# Patient Record
Sex: Male | Born: 1935 | Race: White | Hispanic: No | Marital: Married | Smoking: Former smoker
Health system: Southern US, Community
[De-identification: ages and names within clinical notes are randomized; demographics above are authoritative.]

## PROBLEM LIST (undated history)

## (undated) ENCOUNTER — Emergency Department (HOSPITAL_COMMUNITY): Disposition: A | Discharge status: Home / Self Care | Payer: Commercial Managed Care - HMO

## (undated) ENCOUNTER — Emergency Department (HOSPITAL_COMMUNITY): Payer: Self-pay

## (undated) DIAGNOSIS — I2510 Atherosclerotic heart disease of native coronary artery without angina pectoris: Secondary | ICD-10-CM

## (undated) DIAGNOSIS — Z860100 Personal history of colon polyps, unspecified: Secondary | ICD-10-CM

## (undated) DIAGNOSIS — I639 Cerebral infarction, unspecified: Secondary | ICD-10-CM

## (undated) DIAGNOSIS — F0390 Unspecified dementia without behavioral disturbance: Secondary | ICD-10-CM

## (undated) DIAGNOSIS — I4891 Unspecified atrial fibrillation: Secondary | ICD-10-CM

## (undated) DIAGNOSIS — C679 Malignant neoplasm of bladder, unspecified: Secondary | ICD-10-CM

## (undated) DIAGNOSIS — E538 Deficiency of other specified B group vitamins: Secondary | ICD-10-CM

## (undated) DIAGNOSIS — R413 Other amnesia: Secondary | ICD-10-CM

## (undated) DIAGNOSIS — E785 Hyperlipidemia, unspecified: Secondary | ICD-10-CM

## (undated) HISTORY — PX: CHOLECYSTECTOMY: SHX55

## (undated) HISTORY — DX: Atherosclerotic heart disease of native coronary artery without angina pectoris: I25.10

## (undated) HISTORY — DX: Malignant neoplasm of bladder, unspecified: C67.9

## (undated) HISTORY — DX: Deficiency of other specified B group vitamins: E53.8

## (undated) HISTORY — DX: Other amnesia: R41.3

## (undated) HISTORY — DX: Personal history of colon polyps, unspecified: Z86.0100

## (undated) HISTORY — DX: Personal history of colonic polyps: Z86.010

## (undated) HISTORY — DX: Hyperlipidemia, unspecified: E78.5

---

## 2001-06-12 ENCOUNTER — Ambulatory Visit (HOSPITAL_COMMUNITY): Admission: RE | Admit: 2001-06-12 | Discharge: 2001-06-13 | Payer: Self-pay | Admitting: Cardiovascular Disease

## 2001-08-11 ENCOUNTER — Emergency Department (HOSPITAL_COMMUNITY): Admission: EM | Admit: 2001-08-11 | Discharge: 2001-08-11 | Payer: Self-pay | Admitting: *Deleted

## 2002-01-02 ENCOUNTER — Ambulatory Visit (HOSPITAL_COMMUNITY): Admission: RE | Admit: 2002-01-02 | Discharge: 2002-01-02 | Payer: Self-pay | Admitting: Gastroenterology

## 2002-02-28 ENCOUNTER — Ambulatory Visit (HOSPITAL_COMMUNITY): Admission: RE | Admit: 2002-02-28 | Discharge: 2002-02-28 | Payer: Self-pay | Admitting: Neurological Surgery

## 2002-02-28 ENCOUNTER — Encounter: Payer: Self-pay | Admitting: Neurological Surgery

## 2002-03-04 ENCOUNTER — Ambulatory Visit (HOSPITAL_COMMUNITY): Admission: RE | Admit: 2002-03-04 | Discharge: 2002-03-04 | Payer: Self-pay | Admitting: Neurological Surgery

## 2002-03-05 ENCOUNTER — Ambulatory Visit (HOSPITAL_COMMUNITY): Admission: RE | Admit: 2002-03-05 | Discharge: 2002-03-05 | Payer: Self-pay | Admitting: Neurological Surgery

## 2002-03-05 ENCOUNTER — Encounter: Payer: Self-pay | Admitting: Neurological Surgery

## 2002-03-14 ENCOUNTER — Ambulatory Visit (HOSPITAL_COMMUNITY): Admission: RE | Admit: 2002-03-14 | Discharge: 2002-03-14 | Payer: Self-pay | Admitting: Surgery

## 2002-03-14 ENCOUNTER — Encounter: Payer: Self-pay | Admitting: Surgery

## 2002-03-21 ENCOUNTER — Encounter: Payer: Self-pay | Admitting: Surgery

## 2002-03-21 ENCOUNTER — Ambulatory Visit (HOSPITAL_COMMUNITY): Admission: RE | Admit: 2002-03-21 | Discharge: 2002-03-21 | Payer: Self-pay | Admitting: Surgery

## 2002-05-17 ENCOUNTER — Emergency Department (HOSPITAL_COMMUNITY): Admission: EM | Admit: 2002-05-17 | Discharge: 2002-05-17 | Payer: Self-pay | Admitting: Emergency Medicine

## 2002-06-26 ENCOUNTER — Encounter: Admission: RE | Admit: 2002-06-26 | Discharge: 2002-06-26 | Payer: Self-pay | Admitting: Surgery

## 2002-06-26 ENCOUNTER — Encounter: Payer: Self-pay | Admitting: Surgery

## 2002-07-24 ENCOUNTER — Ambulatory Visit (HOSPITAL_COMMUNITY): Admission: RE | Admit: 2002-07-24 | Discharge: 2002-07-24 | Payer: Self-pay | Admitting: Surgery

## 2002-07-24 ENCOUNTER — Encounter: Payer: Self-pay | Admitting: Surgery

## 2002-07-24 ENCOUNTER — Encounter (INDEPENDENT_AMBULATORY_CARE_PROVIDER_SITE_OTHER): Payer: Self-pay | Admitting: *Deleted

## 2003-01-23 ENCOUNTER — Encounter: Admission: RE | Admit: 2003-01-23 | Discharge: 2003-01-23 | Payer: Self-pay | Admitting: Surgery

## 2003-01-23 ENCOUNTER — Encounter: Payer: Self-pay | Admitting: Surgery

## 2003-04-10 ENCOUNTER — Emergency Department (HOSPITAL_COMMUNITY): Admission: EM | Admit: 2003-04-10 | Discharge: 2003-04-10 | Payer: Self-pay | Admitting: Emergency Medicine

## 2003-06-20 ENCOUNTER — Inpatient Hospital Stay (HOSPITAL_COMMUNITY): Admission: EM | Admit: 2003-06-20 | Discharge: 2003-06-25 | Payer: Self-pay | Admitting: Emergency Medicine

## 2003-07-09 ENCOUNTER — Inpatient Hospital Stay (HOSPITAL_COMMUNITY): Admission: EM | Admit: 2003-07-09 | Discharge: 2003-07-11 | Payer: Self-pay | Admitting: Emergency Medicine

## 2003-07-22 ENCOUNTER — Encounter: Admission: RE | Admit: 2003-07-22 | Discharge: 2003-07-22 | Payer: Self-pay | Admitting: Surgery

## 2004-04-21 ENCOUNTER — Inpatient Hospital Stay (HOSPITAL_COMMUNITY): Admission: EM | Admit: 2004-04-21 | Discharge: 2004-04-22 | Payer: Self-pay | Admitting: Emergency Medicine

## 2004-05-03 ENCOUNTER — Encounter: Admission: RE | Admit: 2004-05-03 | Discharge: 2004-05-03 | Payer: Self-pay | Admitting: Surgery

## 2004-06-14 ENCOUNTER — Ambulatory Visit: Payer: Self-pay | Admitting: Internal Medicine

## 2004-06-20 ENCOUNTER — Ambulatory Visit: Payer: Self-pay | Admitting: Internal Medicine

## 2004-07-27 ENCOUNTER — Emergency Department (HOSPITAL_COMMUNITY): Admission: EM | Admit: 2004-07-27 | Discharge: 2004-07-27 | Payer: Self-pay | Admitting: Emergency Medicine

## 2005-03-03 ENCOUNTER — Ambulatory Visit (HOSPITAL_COMMUNITY): Admission: RE | Admit: 2005-03-03 | Discharge: 2005-03-03 | Payer: Self-pay | Admitting: Gastroenterology

## 2005-03-03 ENCOUNTER — Encounter (INDEPENDENT_AMBULATORY_CARE_PROVIDER_SITE_OTHER): Payer: Self-pay | Admitting: Specialist

## 2005-12-03 ENCOUNTER — Inpatient Hospital Stay (HOSPITAL_COMMUNITY): Admission: AD | Admit: 2005-12-03 | Discharge: 2005-12-08 | Payer: Self-pay | Admitting: Cardiovascular Disease

## 2005-12-03 ENCOUNTER — Encounter: Payer: Self-pay | Admitting: Emergency Medicine

## 2005-12-07 ENCOUNTER — Encounter (INDEPENDENT_AMBULATORY_CARE_PROVIDER_SITE_OTHER): Payer: Self-pay | Admitting: *Deleted

## 2007-07-17 ENCOUNTER — Emergency Department (HOSPITAL_COMMUNITY): Admission: EM | Admit: 2007-07-17 | Discharge: 2007-07-17 | Payer: Self-pay | Admitting: Emergency Medicine

## 2007-08-21 ENCOUNTER — Encounter: Payer: Self-pay | Admitting: Internal Medicine

## 2008-02-10 ENCOUNTER — Encounter: Payer: Self-pay | Admitting: Internal Medicine

## 2008-03-06 ENCOUNTER — Telehealth: Payer: Self-pay | Admitting: Internal Medicine

## 2008-10-19 ENCOUNTER — Encounter: Payer: Self-pay | Admitting: Internal Medicine

## 2008-11-11 ENCOUNTER — Encounter: Payer: Self-pay | Admitting: Internal Medicine

## 2008-11-18 ENCOUNTER — Encounter: Payer: Self-pay | Admitting: Internal Medicine

## 2008-11-19 ENCOUNTER — Encounter: Payer: Self-pay | Admitting: Internal Medicine

## 2008-12-18 ENCOUNTER — Encounter: Admission: RE | Admit: 2008-12-18 | Discharge: 2008-12-18 | Payer: Self-pay | Admitting: Cardiovascular Disease

## 2008-12-18 ENCOUNTER — Encounter: Payer: Self-pay | Admitting: Internal Medicine

## 2008-12-23 ENCOUNTER — Inpatient Hospital Stay (HOSPITAL_COMMUNITY): Admission: RE | Admit: 2008-12-23 | Discharge: 2008-12-24 | Payer: Self-pay | Admitting: Cardiovascular Disease

## 2009-01-04 ENCOUNTER — Encounter: Payer: Self-pay | Admitting: Internal Medicine

## 2009-03-11 ENCOUNTER — Encounter: Payer: Self-pay | Admitting: Internal Medicine

## 2009-03-17 ENCOUNTER — Ambulatory Visit (HOSPITAL_COMMUNITY): Admission: RE | Admit: 2009-03-17 | Discharge: 2009-03-17 | Payer: Self-pay | Admitting: Cardiovascular Disease

## 2009-05-27 ENCOUNTER — Encounter: Payer: Self-pay | Admitting: Internal Medicine

## 2009-07-10 DIAGNOSIS — E538 Deficiency of other specified B group vitamins: Secondary | ICD-10-CM

## 2009-07-10 DIAGNOSIS — R413 Other amnesia: Secondary | ICD-10-CM

## 2009-07-10 HISTORY — DX: Other amnesia: R41.3

## 2009-07-10 HISTORY — DX: Deficiency of other specified B group vitamins: E53.8

## 2009-09-22 ENCOUNTER — Encounter: Payer: Self-pay | Admitting: Internal Medicine

## 2009-09-24 ENCOUNTER — Ambulatory Visit (HOSPITAL_COMMUNITY): Admission: RE | Admit: 2009-09-24 | Discharge: 2009-09-24 | Payer: Self-pay | Admitting: Sports Medicine

## 2009-11-02 ENCOUNTER — Ambulatory Visit: Payer: Self-pay | Admitting: Internal Medicine

## 2009-11-02 DIAGNOSIS — E785 Hyperlipidemia, unspecified: Secondary | ICD-10-CM | POA: Insufficient documentation

## 2009-11-02 DIAGNOSIS — J019 Acute sinusitis, unspecified: Secondary | ICD-10-CM | POA: Insufficient documentation

## 2009-11-02 DIAGNOSIS — I2510 Atherosclerotic heart disease of native coronary artery without angina pectoris: Secondary | ICD-10-CM | POA: Insufficient documentation

## 2009-11-02 DIAGNOSIS — R040 Epistaxis: Secondary | ICD-10-CM | POA: Insufficient documentation

## 2009-11-02 DIAGNOSIS — Z860100 Personal history of colon polyps, unspecified: Secondary | ICD-10-CM | POA: Insufficient documentation

## 2009-11-24 ENCOUNTER — Telehealth (INDEPENDENT_AMBULATORY_CARE_PROVIDER_SITE_OTHER): Payer: Self-pay | Admitting: *Deleted

## 2010-01-27 ENCOUNTER — Ambulatory Visit: Payer: Self-pay | Admitting: Internal Medicine

## 2010-01-27 DIAGNOSIS — R413 Other amnesia: Secondary | ICD-10-CM | POA: Insufficient documentation

## 2010-01-27 DIAGNOSIS — R209 Unspecified disturbances of skin sensation: Secondary | ICD-10-CM | POA: Insufficient documentation

## 2010-01-27 LAB — CONVERTED CEMR LAB
ALT: 6 units/L (ref 0–53)
AST: 13 units/L (ref 0–37)
Albumin: 4.2 g/dL (ref 3.5–5.2)
Alkaline Phosphatase: 64 units/L (ref 39–117)
BUN: 18 mg/dL (ref 6–23)
Basophils Absolute: 0 10*3/uL (ref 0.0–0.1)
Basophils Relative: 0.7 % (ref 0.0–3.0)
Bilirubin Urine: NEGATIVE
Bilirubin, Direct: 0.2 mg/dL (ref 0.0–0.3)
CO2: 30 meq/L (ref 19–32)
Calcium: 9.3 mg/dL (ref 8.4–10.5)
Chloride: 110 meq/L (ref 96–112)
Cholesterol: 136 mg/dL (ref 0–200)
Creatinine, Ser: 1.1 mg/dL (ref 0.4–1.5)
Eosinophils Absolute: 0.1 10*3/uL (ref 0.0–0.7)
Eosinophils Relative: 1.5 % (ref 0.0–5.0)
GFR calc non Af Amer: 71.1 mL/min (ref >60)
Glucose, Bld: 99 mg/dL (ref 70–99)
HCT: 37.3 % — ABNORMAL LOW (ref 39.0–52.0)
HDL: 37.5 mg/dL — ABNORMAL LOW (ref >39.00)
Hemoglobin, Urine: NEGATIVE
Hemoglobin: 12.9 g/dL — ABNORMAL LOW (ref 13.0–17.0)
Ketones, ur: NEGATIVE mg/dL
LDL Cholesterol: 82 mg/dL (ref 0–99)
Leukocytes, UA: NEGATIVE
Lymphocytes Relative: 25.3 % (ref 12.0–46.0)
Lymphs Abs: 1.1 10*3/uL (ref 0.7–4.0)
MCHC: 34.6 g/dL (ref 30.0–36.0)
MCV: 97.7 fL (ref 78.0–100.0)
Monocytes Absolute: 0.4 10*3/uL (ref 0.1–1.0)
Monocytes Relative: 10.2 % (ref 3.0–12.0)
Neutro Abs: 2.7 10*3/uL (ref 1.4–7.7)
Neutrophils Relative %: 62.3 % (ref 43.0–77.0)
Nitrite: NEGATIVE
PSA: 3.79 ng/mL (ref 0.10–4.00)
Platelets: 121 10*3/uL — ABNORMAL LOW (ref 150.0–400.0)
Potassium: 4.2 meq/L (ref 3.5–5.1)
RBC: 3.82 M/uL — ABNORMAL LOW (ref 4.22–5.81)
RDW: 13.4 % (ref 11.5–14.6)
Sed Rate: 15 mm/hr (ref 0–22)
Sodium: 147 meq/L — ABNORMAL HIGH (ref 135–145)
Specific Gravity, Urine: >=1.03 (ref 1.000–1.030)
TSH: 3.75 microintl units/mL (ref 0.35–5.50)
Total Bilirubin: 0.8 mg/dL (ref 0.3–1.2)
Total CHOL/HDL Ratio: 4
Total Protein, Urine: NEGATIVE mg/dL
Total Protein: 6.7 g/dL (ref 6.0–8.3)
Triglycerides: 85 mg/dL (ref 0.0–149.0)
Urine Glucose: NEGATIVE mg/dL
Urobilinogen, UA: 1 (ref 0.0–1.0)
VLDL: 17 mg/dL (ref 0.0–40.0)
Vit D, 25-Hydroxy: 46 ng/mL (ref 30–89)
Vitamin B-12: 183 pg/mL — ABNORMAL LOW (ref 211–911)
WBC: 4.3 10*3/uL — ABNORMAL LOW (ref 4.5–10.5)
pH: 6 (ref 5.0–8.0)

## 2010-01-28 ENCOUNTER — Telehealth: Payer: Self-pay | Admitting: Internal Medicine

## 2010-01-28 ENCOUNTER — Emergency Department (HOSPITAL_COMMUNITY): Admission: EM | Admit: 2010-01-28 | Discharge: 2010-01-28 | Payer: Self-pay | Admitting: Emergency Medicine

## 2010-01-31 ENCOUNTER — Ambulatory Visit: Payer: Self-pay | Admitting: Internal Medicine

## 2010-01-31 DIAGNOSIS — E538 Deficiency of other specified B group vitamins: Secondary | ICD-10-CM | POA: Insufficient documentation

## 2010-02-04 ENCOUNTER — Ambulatory Visit: Payer: Self-pay | Admitting: Internal Medicine

## 2010-02-04 DIAGNOSIS — R42 Dizziness and giddiness: Secondary | ICD-10-CM | POA: Insufficient documentation

## 2010-02-07 ENCOUNTER — Ambulatory Visit: Payer: Self-pay | Admitting: Cardiovascular Disease

## 2010-02-16 ENCOUNTER — Ambulatory Visit: Payer: Self-pay | Admitting: Internal Medicine

## 2010-02-16 ENCOUNTER — Telehealth: Payer: Self-pay | Admitting: Internal Medicine

## 2010-02-18 ENCOUNTER — Ambulatory Visit: Payer: Self-pay | Admitting: Internal Medicine

## 2010-02-18 ENCOUNTER — Telehealth: Payer: Self-pay | Admitting: Internal Medicine

## 2010-02-21 ENCOUNTER — Ambulatory Visit: Payer: Self-pay | Admitting: Internal Medicine

## 2010-02-24 ENCOUNTER — Ambulatory Visit: Payer: Self-pay | Admitting: Internal Medicine

## 2010-02-28 ENCOUNTER — Ambulatory Visit: Payer: Self-pay | Admitting: Internal Medicine

## 2010-03-04 ENCOUNTER — Ambulatory Visit: Payer: Self-pay | Admitting: Internal Medicine

## 2010-03-17 ENCOUNTER — Ambulatory Visit: Payer: Self-pay | Admitting: Internal Medicine

## 2010-04-11 ENCOUNTER — Ambulatory Visit: Payer: Self-pay | Admitting: Internal Medicine

## 2010-08-09 NOTE — Letter (Signed)
Summary: Office Visit / [facility]  Office Visit / [facility]   Imported By: Lennie Odor 04/15/2009 14:18:12  _____________________________________________________________________  External Attachment:    Type:   Image     Comment:   External Document

## 2010-08-09 NOTE — Assessment & Plan Note (Signed)
Summary: FU ON OPTIONS PER PHONE NOTE/[facility] #   Vital Signs:  Patient profile:   77 year old male Height:      72 inches Weight:      206 pounds BMI:     28.04 Temp:     97.4 degrees F oral Pulse rate:   72 / minute Pulse rhythm:   regular Resp:     16 per minute BP sitting:   128 / 90  (left arm) Cuff size:   regular  Vitals Entered By: Lanier Prude, Beverly Gust) (February 04, 2010 8:51 AM) CC: dizziness/lightheadedness and weakness Is Patient Diabetic? No Comments needs alt to replace Aricept.  Pt is not taking Vit D   CC:  dizziness/lightheadedness and weakness.  History of Present Illness: F/u Vit [name] def, memory loss, fatigue - better C/o a dizzy spell today x 30 min  Current Medications (verified): 1)  Vitamin D 1000 Unit Tabs (Cholecalciferol) .Marland Kitchen.. 1 By Mouth Qd 2)  Nosebleedqr  Powd (Hydrophilic Polymer) .... Use As Needed Nose Bleed As Dirrected 3)  Aspirin 81 Mg Tbec (Aspirin) .Marland Kitchen.. 1 By Mouth Qd 4)  Carvedilol 25 Mg Tabs (Carvedilol) 5)  Tamsulosin Hcl 0.4 Mg Caps (Tamsulosin Hcl) 6)  Hydrochlorothiazide 12.5 Mg Caps (Hydrochlorothiazide) 7)  Diltiazem Hcl 120 Mg Tabs (Diltiazem Hcl) 8)  Isosorbide Mononitrate Cr 60 Mg Xr24h-Tab (Isosorbide Mononitrate) 9)  Amiodarone Hcl 200 Mg Tabs (Amiodarone Hcl) 10)  Crestor 20 Mg Tabs (Rosuvastatin Calcium) 11)  Aricept 10 Mg Tabs (Donepezil Hcl) .Marland Kitchen.. 1 By Mouth Once Daily For Memory  Allergies (verified): No Known Drug Allergies  Past History:  Past Medical History: Bladder Ca Tannenbaum Colonic polyps, hx of Coronary artery disease Dr Tresa Endo Hyperlipidemia Memory loss 2011 [name] def 2011  Family History: Reviewed history from 01/27/2010 and no changes required. CAD Dementia  Social History: Reviewed history from 11/02/2009 and no changes required. Retired Married Current Smoker Alcohol use-no Regular exercise-no  Review of Systems  The patient denies fever, chest pain, syncope, dyspnea on exertion, and  headaches.    Physical Exam  General:  NAD  overweight-appearing.   Head:  normocephalic and atraumatic.   Eyes:  vision grossly intact, pupils equal, and pupils round.   Ears:  R ear normal and L ear normal.   Nose:  No blood present Mouth:  Erythematous throat mucosa and intranasal erythema.  Neck:  No deformities, masses, or tenderness noted. Lungs:  Normal respiratory effort, chest expands symmetrically. Lungs are clear to auscultation, no crackles or wheezes. Heart:  Normal rate and regular rhythm. S1 and S2 normal without gallop, murmur, click, rub or other extra sounds. Abdomen:  Bowel sounds positive,abdomen soft and non-tender without masses, organomegaly or hernias noted. Msk:  No deformity or scoliosis noted of thoracic or lumbar spine.   Neurologic:  No cranial nerve deficits noted. Station and gait are normal. Plantar reflexes are down-going bilaterally. DTRs are symmetrical throughout. Sensory, motor and coordinative functions appear intact. H-P (+) on R Skin:  Intact without suspicious lesions or rashes Psych:  Cognition and judgment appear intact. Alert and cooperative with normal attention span and concentration. No apparent delusions, illusions, hallucinations   Impression & Recommendations:  Problem # 1:  VERTIGO (ICD-780.4) BPV Assessment New  Francee Piccolo - Daroff exercise was given to the patient   His updated medication list for this problem includes:    Meclizine Hcl 12.5 Mg Tabs (Meclizine hcl) .Marland Kitchen... 1-2 by mouth qid as needed dizziness  Problem # 2:  [name] DEFICIENCY (ICD-266.2) Assessment: Improved  Orders: Admin of Therapeutic Inj  intramuscular or subcutaneous (72536) Vit [name] 1000 mcg (J3420)  Problem # 3:  MEMORY LOSS (ICD-780.93) Assessment: Improved  Problem # 4:  CORONARY ARTERY DISEASE (ICD-414.00) Assessment: Unchanged  His updated medication list for this problem includes:    Aspirin 81 Mg Tbec (Aspirin) .Marland Kitchen... 1 by mouth qd    Carvedilol 25 Mg  Tabs (Carvedilol)    Hydrochlorothiazide 12.5 Mg Caps (Hydrochlorothiazide)    Diltiazem Hcl 120 Mg Tabs (Diltiazem hcl)    Isosorbide Mononitrate Cr 60 Mg Xr24h-tab (Isosorbide mononitrate)    Amiodarone Hcl 200 Mg Tabs (Amiodarone hcl)  Complete Medication List: 1)  Vitamin D 1000 Unit Tabs (Cholecalciferol) .Marland Kitchen.. 1 by mouth qd 2)  Nosebleedqr Powd (Hydrophilic polymer) .... Use as needed nose bleed as dirrected 3)  Aspirin 81 Mg Tbec (Aspirin) .Marland Kitchen.. 1 by mouth qd 4)  Carvedilol 25 Mg Tabs (Carvedilol) 5)  Tamsulosin Hcl 0.4 Mg Caps (Tamsulosin hcl) 6)  Hydrochlorothiazide 12.5 Mg Caps (Hydrochlorothiazide) 7)  Diltiazem Hcl 120 Mg Tabs (Diltiazem hcl) 8)  Isosorbide Mononitrate Cr 60 Mg Xr24h-tab (Isosorbide mononitrate) 9)  Amiodarone Hcl 200 Mg Tabs (Amiodarone hcl) 10)  Crestor 20 Mg Tabs (Rosuvastatin calcium) 11)  Aricept 10 Mg Tabs (Donepezil hcl) .Marland Kitchen.. 1 by mouth once daily for memory 12)  Meclizine Hcl 12.5 Mg Tabs (Meclizine hcl) .Marland Kitchen.. 1-2 by mouth qid as needed dizziness  Patient Instructions: 1)  Francee Piccolo - Daroff exercise was given to you 2)  Please schedule a follow-up appointment in 1 month Vit [name] proir 266.20. 3)  Call if you are not better in a reasonable amount of time or if worse.  Prescriptions: MECLIZINE HCL 12.5 MG TABS (MECLIZINE HCL) 1-2 by mouth qid as needed dizziness  #60 x 1   Entered and Authorized by:   Tresa Garter MD   Signed by:   Tresa Garter MD on 02/04/2010   Method used:   Print then Give to Patient   RxID:   [id number]    Medication Administration  Injection # 1:    Medication: Vit [name] 1000 mcg    Diagnosis: [name] DEFICIENCY (ICD-266.2)    Route: IM    Site: L deltoid    Exp Date: 10/09/2011    Lot #: [id number]    Mfr: [employer]    Patient tolerated injection without complications    Given by: Lanier Prude, CMA(AAMA) (February 04, 2010 9:45 AM)  Orders Added: 1)  Admin of Therapeutic Inj  intramuscular or  subcutaneous [96372] 2)  Vit [name] 1000 mcg [J3420] 3)  Est. Patient Level IV [51884]

## 2010-08-09 NOTE — Assessment & Plan Note (Signed)
Summary: b-12/Plot pt/SD  Nurse Visit   Allergies: No Known Drug Allergies  Medication Administration  Injection # 1:    Medication: Vit [name] 1000 mcg    Diagnosis: [name] DEFICIENCY (ICD-266.2)    Route: IM    Site: L deltoid    Exp Date: 11/08/2011    Lot #: [id number]    Mfr: [employer]    Patient tolerated injection without complications    Given by: Lamar Sprinkles, CMA (February 16, 2010 9:35 AM)  Orders Added: 1)  Vit [name] 1000 mcg [J3420] 2)  Admin of Therapeutic Inj  intramuscular or subcutaneous [96372]   Medication Administration  Injection # 1:    Medication: Vit [name] 1000 mcg    Diagnosis: [name] DEFICIENCY (ICD-266.2)    Route: IM    Site: L deltoid    Exp Date: 11/08/2011    Lot #: [id number]    Mfr: [employer]    Patient tolerated injection without complications    Given by: Lamar Sprinkles, CMA (February 16, 2010 9:35 AM)  Orders Added: 1)  Vit [name] 1000 mcg [J3420] 2)  Admin of Therapeutic Inj  intramuscular or subcutaneous [40347]

## 2010-08-09 NOTE — Assessment & Plan Note (Signed)
Summary: [name] [name]  Nurse Visit   Allergies: No Known Drug Allergies  Medication Administration  Injection # 1:    Medication: Vit [name] 1000 mcg    Diagnosis: [name] DEFICIENCY (ICD-266.2)    Route: IM    Site: L deltoid    Exp Date: 11/08/2011    Lot #: [id number]    Mfr: [employer]    Patient tolerated injection without complications    Given by: Lamar Sprinkles, CMA (February 21, 2010 12:02 PM)  Orders Added: 1)  Admin of Therapeutic Inj  intramuscular or subcutaneous [96372] 2)  Vit [name] 1000 mcg [J3420]   Medication Administration  Injection # 1:    Medication: Vit [name] 1000 mcg    Diagnosis: [name] DEFICIENCY (ICD-266.2)    Route: IM    Site: L deltoid    Exp Date: 11/08/2011    Lot #: [id number]    Mfr: [employer]    Patient tolerated injection without complications    Given by: Lamar Sprinkles, CMA (February 21, 2010 12:02 PM)  Orders Added: 1)  Admin of Therapeutic Inj  intramuscular or subcutaneous [96372] 2)  Vit [name] 1000 mcg [J3420]

## 2010-08-09 NOTE — Progress Notes (Signed)
Summary: [name] ?  Phone Note Call from Patient   Caller: Patient Summary of Call: pt is asking if he should cont oral Vit b12 as well as inj? Initial call taken by: Lanier Prude, [facility]),  February 18, 2010 9:40 AM  Follow-up for Phone Call        OK to stop oral [name] now Follow-up by: Tresa Garter MD,  February 18, 2010 12:45 PM  Additional Follow-up for Phone Call Additional follow up Details #1::        Pt informed  Additional Follow-up by: Margaret Pyle, CMA,  February 18, 2010 3:07 PM

## 2010-08-09 NOTE — Assessment & Plan Note (Signed)
Summary: b12 shot/cd  Nurse Visit   Allergies: No Known Drug Allergies  Medication Administration  Injection # 1:    Medication: Vit [name] 1000 mcg    Diagnosis: [name] DEFICIENCY (ICD-266.2)    Route: IM    Site: R deltoid    Exp Date: 01/08/2012    Lot #: [id number]    Mfr: [employer]    Patient tolerated injection without complications    Given by: Lamar Sprinkles, CMA (April 11, 2010 10:44 AM)  Orders Added: 1)  Admin of Therapeutic Inj  intramuscular or subcutaneous [96372] 2)  Vit [name] 1000 mcg [J3420]   Medication Administration  Injection # 1:    Medication: Vit [name] 1000 mcg    Diagnosis: [name] DEFICIENCY (ICD-266.2)    Route: IM    Site: R deltoid    Exp Date: 01/08/2012    Lot #: [id number]    Mfr: [employer]    Patient tolerated injection without complications    Given by: Lamar Sprinkles, CMA (April 11, 2010 10:44 AM)  Orders Added: 1)  Admin of Therapeutic Inj  intramuscular or subcutaneous [96372] 2)  Vit [name] 1000 mcg [J3420]

## 2010-08-09 NOTE — Assessment & Plan Note (Signed)
Summary: [name] [name] [name]  Nurse Visit   Allergies: No Known Drug Allergies  Medication Administration  Injection # 1:    Medication: Vit [name] 1000 mcg    Diagnosis: [name] DEFICIENCY (ICD-266.2)    Route: IM    Site: R deltoid    Exp Date: 11/08/2011    Lot #: [id number]    Mfr: [employer]    Patient tolerated injection without complications    Given by: Lanier Prude, CMA(AAMA) (February 18, 2010 9:40 AM)  Orders Added: 1)  Vit [name] 1000 mcg [J3420] 2)  Admin of Therapeutic Inj  intramuscular or subcutaneous [57846]

## 2010-08-09 NOTE — Consult Note (Signed)
Summary: [facility]  [facility]   Imported By: Maryln Gottron 08/28/2007 14:53:55  _____________________________________________________________________  External Attachment:    Type:   Image     Comment:   External Document

## 2010-08-09 NOTE — Progress Notes (Signed)
  Phone Note From Other Clinic   Summary of Call: Pt's chiropractor called: had tx today but c/o severe low back pain. Chiropractor told pt to be seen by PCP. Pt last seen 2005, advised Dr that pt may not be able to be seen today b/c considered new pt.  Initial call taken by: Lamar Sprinkles,  March 06, 2008 10:18 AM  Follow-up for Phone Call        Go to [facility]. I could see him next week. Follow-up by: Tresa Garter MD,  March 06, 2008 1:29 PM  Additional Follow-up for Phone Call Additional follow up Details #1::        Pt never called or came in for apt Additional Follow-up by: Lamar Sprinkles,  March 06, 2008 2:42 PM

## 2010-08-09 NOTE — Letter (Signed)
Summary: Cardiologic Eval/[facility]  Cardiologic Eval/[facility]   Imported By: Sherian Rein 10/27/2008 14:02:54  _____________________________________________________________________  External Attachment:    Type:   Image     Comment:   External Document

## 2010-08-09 NOTE — Assessment & Plan Note (Signed)
Summary: B-12 / AVP [name]  Nurse Visit   Allergies: No Known Drug Allergies  Medication Administration  Injection # 1:    Medication: Vit [name] 1000 mcg    Diagnosis: [name] DEFICIENCY (ICD-266.2)    Route: IM    Site: L deltoid    Exp Date: 12/09/2011    Lot #: [id number]    Mfr: [employer]    Patient tolerated injection without complications    Given by: Lamar Sprinkles, CMA (March 17, 2010 10:27 AM)  Orders Added: 1)  Admin of Therapeutic Inj  intramuscular or subcutaneous [96372] 2)  Vit [name] 1000 mcg [J3420] 3)  Flu Vaccine 51yrs + MEDICARE PATIENTS [Q2039] 4)  Administration Flu vaccine - MCR [G0008]   Medication Administration  Injection # 1:    Medication: Vit [name] 1000 mcg    Diagnosis: [name] DEFICIENCY (ICD-266.2)    Route: IM    Site: L deltoid    Exp Date: 12/09/2011    Lot #: [id number]    Mfr: [employer]    Patient tolerated injection without complications    Given by: Lamar Sprinkles, CMA (March 17, 2010 10:27 AM)  Orders Added: 1)  Admin of Therapeutic Inj  intramuscular or subcutaneous [96372] 2)  Vit [name] 1000 mcg [J3420] 3)  Flu Vaccine 62yrs + MEDICARE PATIENTS [Q2039] 4)  Administration Flu vaccine - MCR [G0008]   Flu Vaccine Consent Questions     Do you have a history of severe allergic reactions to this vaccine? no    Any prior history of allergic reactions to egg and/or gelatin? no    Do you have a sensitivity to the preservative Thimersol? no    Do you have a past history of Guillan-Barre Syndrome? no    Do you currently have an acute febrile illness? no    Have you ever had a severe reaction to latex? no    Vaccine information given and explained to patient? yes    Are you currently pregnant? no    Lot Number:[id number]   Exp Date:01/07/2011   Site Given  RIGHT Deltoid IMdflu

## 2010-08-09 NOTE — Progress Notes (Signed)
  Phone Note Outgoing Call   Call placed by: Denny Peon Call placed to: Pt Initial call taken by: Denny Peon    Called to let Pt know I have recieved their Check in the Mail for the Charge By [facility]. Envelope was addressed to [facility],Check was made out to [employer], called and got the ok from pt to Mail check out to [employer]. Placed in Mail courier today. Cala Bradford Mesiemore  Nov 24, 2009 11:19 AM

## 2010-08-09 NOTE — Assessment & Plan Note (Signed)
Summary: [name] [name]  Nurse Visit   Allergies: No Known Drug Allergies  Medication Administration  Injection # 1:    Medication: Vit [name] 1000 mcg    Diagnosis: [name] DEFICIENCY (ICD-266.2)    Route: IM    Site: R deltoid    Exp Date: 10/09/2011    Lot #: [id number]    Mfr: [employer]    Patient tolerated injection without complications    Given by: Lamar Sprinkles, CMA (February 24, 2010 10:31 AM)  Orders Added: 1)  Vit [name] 1000 mcg [J3420] 2)  Admin of Therapeutic Inj  intramuscular or subcutaneous [96372]   Medication Administration  Injection # 1:    Medication: Vit [name] 1000 mcg    Diagnosis: [name] DEFICIENCY (ICD-266.2)    Route: IM    Site: R deltoid    Exp Date: 10/09/2011    Lot #: [id number]    Mfr: [employer]    Patient tolerated injection without complications    Given by: Lamar Sprinkles, CMA (February 24, 2010 10:31 AM)  Orders Added: 1)  Vit [name] 1000 mcg [J3420] 2)  Admin of Therapeutic Inj  intramuscular or subcutaneous [16109]

## 2010-08-09 NOTE — Assessment & Plan Note (Signed)
Summary: memory loss-lb   Vital Signs:  Patient profile:   77 year old male Height:      72 inches Weight:      204 pounds BMI:     27.77 O2 Sat:      95 % on Room air Temp:     98.2 degrees F oral Pulse rate:   83 / minute Pulse rhythm:   regular Resp:     14 per minute BP sitting:   108 / 72  (left arm) Cuff size:   regular  Vitals Entered By: Lanier Prude, CMA(AAMA) (January 27, 2010 11:18 AM)  O2 Flow:  Room air CC: memory loss/forgetfulness & dizziness   CC:  memory loss/forgetfulness & dizziness.  History of Present Illness: C/o memory loss x 1-2 years: problems w short term memory C/o tingling in LE F/u CAD, dyslipidemia  Current Medications (verified): 1)  Vitamin D 1000 Unit Tabs (Cholecalciferol) .Marland Kitchen.. 1 By Mouth Qd 2)  Nosebleedqr  Powd (Hydrophilic Polymer) .... Use As Needed Nose Bleed As Dirrected 3)  Aspirin 81 Mg Tbec (Aspirin) .Marland Kitchen.. 1 By Mouth Qd 4)  Carvedilol 25 Mg Tabs (Carvedilol) 5)  Tamsulosin Hcl 0.4 Mg Caps (Tamsulosin Hcl) 6)  Hydrochlorothiazide 12.5 Mg Caps (Hydrochlorothiazide) 7)  Diltiazem Hcl 120 Mg Tabs (Diltiazem Hcl) 8)  Isosorbide Mononitrate Cr 60 Mg Xr24h-Tab (Isosorbide Mononitrate) 9)  Amiodarone Hcl 200 Mg Tabs (Amiodarone Hcl) 10)  Crestor 20 Mg Tabs (Rosuvastatin Calcium)  Allergies (verified): No Known Drug Allergies  Past History:  Past Surgical History: Last updated: 11/02/2009 Cholecystectomy  Social History: Last updated: 11/02/2009 Retired Married Current Smoker Alcohol use-no Regular exercise-no  Past Medical History: Bladder Ca Tannenbaum Colonic polyps, hx of Coronary artery disease Dr Tresa Endo Hyperlipidemia Memory loss 2011  Family History: CAD Dementia  Review of Systems  The patient denies fever, weight loss, weight gain, chest pain, syncope, dyspnea on exertion, abdominal pain, hematochezia, difficulty walking, depression, and breast masses.    Physical Exam  General:   NADoverweight-appearing.   Head:  normocephalic and atraumatic.   Eyes:  vision grossly intact, pupils equal, and pupils round.   Ears:  R ear normal and L ear normal.   Nose:  No blood present Mouth:  Erythematous throat mucosa and intranasal erythema.  Neck:  No deformities, masses, or tenderness noted. Chest Wall:  No deformities, masses, tenderness or gynecomastia noted. Lungs:  Normal respiratory effort, chest expands symmetrically. Lungs are clear to auscultation, no crackles or wheezes. Heart:  Normal rate and regular rhythm. S1 and S2 normal without gallop, murmur, click, rub or other extra sounds. Abdomen:  Bowel sounds positive,abdomen soft and non-tender without masses, organomegaly or hernias noted. Msk:  No deformity or scoliosis noted of thoracic or lumbar spine.   Extremities:  No clubbing, cyanosis, edema, or deformity noted with normal full range of motion of all joints.   Neurologic:  No cranial nerve deficits noted. Station and gait are normal. Plantar reflexes are down-going bilaterally. DTRs are symmetrical throughout. Sensory, motor and coordinative functions appear intact. Skin:  Intact without suspicious lesions or rashes Psych:  Cognition and judgment appear intact. Alert and cooperative with normal attention span and concentration. No apparent delusions, illusions, hallucinations   Impression & Recommendations:  Problem # 1:  MEMORY LOSS (ICD-780.93) Assessment New R/o reversible causes. Trial of Aricept Orders: T-Vitamin D (25-Hydroxy) [phone]) Radiology Referral (Radiology) TLB-[name], Serum-Total ONLY (41324-M01) TLB-BMP (Basic Metabolic Panel-BMET) (80048-METABOL) TLB-CBC Platelet - w/Differential (85025-CBCD) TLB-Lipid Panel (80061-LIPID) TLB-Sedimentation Rate (  ESR) (85652-ESR) TLB-TSH (Thyroid Stimulating Hormone) (84443-TSH) TLB-Udip ONLY (81003-UDIP) TLB-PSA (Prostate Specific Antigen) (84153-PSA) TLB-Hepatic/Liver Function Pnl  (80076-HEPATIC)  Problem # 2:  PARESTHESIA (ICD-782.0) Assessment: New  Orders: T-Vitamin D (25-Hydroxy) [phone]) TLB-[name], Serum-Total ONLY (09811-B14) TLB-BMP (Basic Metabolic Panel-BMET) (80048-METABOL) TLB-CBC Platelet - w/Differential (85025-CBCD) TLB-Lipid Panel (80061-LIPID) TLB-Sedimentation Rate (ESR) (85652-ESR) TLB-TSH (Thyroid Stimulating Hormone) (84443-TSH) TLB-Udip ONLY (81003-UDIP) TLB-PSA (Prostate Specific Antigen) (84153-PSA) TLB-Hepatic/Liver Function Pnl (80076-HEPATIC)  Problem # 3:  EPISTAXIS (ICD-784.7) resolved Assessment: Comment Only  Problem # 4:  CORONARY ARTERY DISEASE (ICD-414.00) Assessment: Unchanged  His updated medication list for this problem includes:    Aspirin 81 Mg Tbec (Aspirin) .Marland Kitchen... 1 by mouth qd    Carvedilol 25 Mg Tabs (Carvedilol)    Hydrochlorothiazide 12.5 Mg Caps (Hydrochlorothiazide)    Diltiazem Hcl 120 Mg Tabs (Diltiazem hcl)    Isosorbide Mononitrate Cr 60 Mg Xr24h-tab (Isosorbide mononitrate)    Amiodarone Hcl 200 Mg Tabs (Amiodarone hcl)  Orders: T-Vitamin D (25-Hydroxy) (78295-62130) TLB-[name], Serum-Total ONLY (86578-I69) TLB-BMP (Basic Metabolic Panel-BMET) (80048-METABOL) TLB-CBC Platelet - w/Differential (85025-CBCD) TLB-Lipid Panel (80061-LIPID) TLB-Sedimentation Rate (ESR) (85652-ESR) TLB-TSH (Thyroid Stimulating Hormone) (84443-TSH) TLB-Udip ONLY (81003-UDIP) TLB-PSA (Prostate Specific Antigen) (84153-PSA) TLB-Hepatic/Liver Function Pnl (80076-HEPATIC)  Problem # 5:  HYPERLIPIDEMIA (ICD-272.4) Assessment: Improved  His updated medication list for this problem includes:    Crestor 20 Mg Tabs (Rosuvastatin calcium)  Complete Medication List: 1)  Vitamin D 1000 Unit Tabs (Cholecalciferol) .Marland Kitchen.. 1 by mouth qd 2)  Nosebleedqr Powd (Hydrophilic polymer) .... Use as needed nose bleed as dirrected 3)  Aspirin 81 Mg Tbec (Aspirin) .Marland Kitchen.. 1 by mouth qd 4)  Carvedilol 25 Mg Tabs (Carvedilol) 5)  Tamsulosin Hcl  0.4 Mg Caps (Tamsulosin hcl) 6)  Hydrochlorothiazide 12.5 Mg Caps (Hydrochlorothiazide) 7)  Diltiazem Hcl 120 Mg Tabs (Diltiazem hcl) 8)  Isosorbide Mononitrate Cr 60 Mg Xr24h-tab (Isosorbide mononitrate) 9)  Amiodarone Hcl 200 Mg Tabs (Amiodarone hcl) 10)  Crestor 20 Mg Tabs (Rosuvastatin calcium) 11)  Aricept 10 Mg Tabs (Donepezil hcl) .Marland Kitchen.. 1 by mouth once daily for memory  Patient Instructions: 1)  Please schedule a follow-up appointment in 2 months. Prescriptions: ARICEPT 10 MG TABS (DONEPEZIL HCL) 1 by mouth once daily for memory  #30 x 12   Entered and Authorized by:   Tresa Garter MD   Signed by:   Tresa Garter MD on 01/27/2010   Method used:   Print then Give to Patient   RxID:   [id number]

## 2010-08-09 NOTE — Assessment & Plan Note (Signed)
Summary: b-12 injection-lb  Nurse Visit   Allergies: No Known Drug Allergies  Medication Administration  Injection # 1:    Medication: Vit [name] 1000 mcg    Diagnosis: [name] DEFICIENCY (ICD-266.2)    Route: IM    Site: R deltoid    Exp Date: 10/09/2011    Lot #: [id number]    Mfr: [employer]    Patient tolerated injection without complications    Given by: Lanier Prude, CMA(AAMA) (February 01, 2010 11:20 AM)  Orders Added: 1)  Vit [name] 1000 mcg [J3420] 2)  Admin of Therapeutic Inj  intramuscular or subcutaneous [40102]

## 2010-08-09 NOTE — Assessment & Plan Note (Signed)
Summary: blood running out of nose-lb   Vital Signs:  Patient profile:   77 year old male Height:      72 inches (182.88 cm) Weight:      196.25 pounds (89.20 kg) BMI:     26.71 O2 Sat:      94 % on Room air Temp:     98.6 degrees F (37.00 degrees C) oral Pulse rate:   78 / minute Pulse rhythm:   regular BP sitting:   112 / 62  (left arm) Cuff size:   regular  Vitals Entered By: Laury Deep (November 02, 2009 3:43 PM)  O2 Flow:  Room air CC: [name] c/o severe nosebleed twice in 2 weeks due to flu like symptoms, , [name] c/o cough, wheezing, congestion, producing yellowish mucus/[name] states he is on medications but did not bring list will call office with updated med list/aj   CC:  [name] c/o severe nosebleed twice in 2 weeks due to flu like symptoms, , [name] c/o cough, wheezing, congestion, and producing yellowish mucus/[name] states he is on medications but did not bring list will call office with updated med list/aj.  History of Present Illness: C/o nose bleeds x2 in 2 wks, yellow drainage and a dull sinus pain - worse now. Tylenol helped.    Preventive Screening-Counseling & Management  Alcohol-Tobacco     Smoking Status: current  Caffeine-Diet-Exercise     Does Patient Exercise: no  Allergies (verified): No Known Drug Allergies  Past History:  Past Medical History: Bladder Ca Tannenbaum Colonic polyps, hx of Coronary artery disease Dr Tresa Endo Hyperlipidemia  Past Surgical History: Cholecystectomy  Family History: CAD  Social History: Retired Married Current Smoker Alcohol use-no Regular exercise-no Smoking Status:  current Does Patient Exercise:  no  Review of Systems       The patient complains of fever.  The patient denies anorexia, weight loss, weight gain, vision loss, decreased hearing, hoarseness, chest pain, syncope, dyspnea on exertion, peripheral edema, prolonged cough, headaches, hemoptysis, abdominal pain, melena, hematochezia, severe indigestion/heartburn,  hematuria, incontinence, genital sores, muscle weakness, suspicious skin lesions, transient blindness, difficulty walking, depression, unusual weight change, abnormal bleeding, enlarged lymph nodes, angioedema, and testicular masses.    Physical Exam  General:  NADoverweight-appearing.   Head:  normocephalic and atraumatic.   Eyes:  vision grossly intact, pupils equal, and pupils round.   Ears:  R ear normal and L ear normal.   Nose:  No blood present Mouth:  Erythematous throat mucosa and intranasal erythema.  Neck:  No deformities, masses, or tenderness noted. Chest Wall:  No deformities, masses, tenderness or gynecomastia noted. Lungs:  Normal respiratory effort, chest expands symmetrically. Lungs are clear to auscultation, no crackles or wheezes. Heart:  Normal rate and regular rhythm. S1 and S2 normal without gallop, murmur, click, rub or other extra sounds. Abdomen:  Bowel sounds positive,abdomen soft and non-tender without masses, organomegaly or hernias noted. Msk:  No deformity or scoliosis noted of thoracic or lumbar spine.   Pulses:  R and L carotid,radial,femoral,dorsalis pedis and posterior tibial pulses are full and equal bilaterally Extremities:  No clubbing, cyanosis, edema, or deformity noted with normal full range of motion of all joints.   Neurologic:  No cranial nerve deficits noted. Station and gait are normal. Plantar reflexes are down-going bilaterally. DTRs are symmetrical throughout. Sensory, motor and coordinative functions appear intact. Skin:  Intact without suspicious lesions or rashes Cervical Nodes:  No lymphadenopathy noted Inguinal Nodes:  No significant adenopathy Psych:  Cognition and judgment appear intact. Alert and cooperative with normal attention span and concentration. No apparent delusions, illusions, hallucinations   Impression & Recommendations:  Problem # 1:  SINUSITIS, ACUTE (ICD-461.9) Assessment New  His updated medication list for this  problem includes:    Amoxicillin 500 Mg Caps (Amoxicillin) .Marland Kitchen... 2 caps by mouth bid  Problem # 2:  EPISTAXIS (ICD-784.7) due to #1 Assessment: New Nosebleed qr as needed  ENT consult if needed  Problem # 3:  CORONARY ARTERY DISEASE (ICD-414.00) Assessment: Unchanged Get labs His updated medication list for this problem includes:    Aspirin 81 Mg Tbec (Aspirin) .Marland Kitchen... 1 by mouth qd  Complete Medication List: 1)  Vitamin D 1000 Unit Tabs (Cholecalciferol) .Marland Kitchen.. 1 by mouth qd 2)  Nosebleedqr Powd (Hydrophilic polymer) .... Use as needed nose bleed as dirrected 3)  Amoxicillin 500 Mg Caps (Amoxicillin) .... 2 caps by mouth bid 4)  Aspirin 81 Mg Tbec (Aspirin) .Marland Kitchen.. 1 by mouth qd  Patient Instructions: 1)  Call if you are not better in a reasonable amount of time or if worse. Go to ER if feeling really bad!   Prescriptions: AMOXICILLIN 500 MG CAPS (AMOXICILLIN) 2 caps by mouth bid  #40 x 0   Entered and Authorized by:   Tresa Garter MD   Signed by:   Tresa Garter MD on 11/02/2009   Method used:   Print then Give to Patient   RxID:   [id number] NOSEBLEEDQR  POWD (HYDROPHILIC POLYMER) use as needed nose bleed as dirrected  #4 x 4   Entered and Authorized by:   Tresa Garter MD   Signed by:   Tresa Garter MD on 11/02/2009   Method used:   Print then Give to Patient   RxID:   [id number]

## 2010-08-09 NOTE — Progress Notes (Signed)
Summary: [name] - SCHEDULE  Phone Note Other Incoming   Summary of Call:  PER MD: Patient is to get b12 - 2 xs a week x 4 wks then every 2 wks x's mth then once a month. Pt informed  Initial call taken by: Lamar Sprinkles, CMA,  February 16, 2010 9:37 AM

## 2010-08-09 NOTE — Progress Notes (Signed)
Summary: Allergic Reaction  Phone Note Call from Patient Call back at Home Phone [phone]   Caller: Patient Summary of Call: Pt called stating that medications he was started on yesterday at OV caused pt to have to do to ER this morning. Pt was advised by ER MD that he should not take this medicine again, not even in small dosages and to inform prescribing MD of reaction: Severe HA, N&V, Diarrhea, swelling of his entire body including eyes, mouth, tongue and lips. Pt went to [facility] ER but is unclear of what medications he was given at hospital and discharged with. (Possibly benadryl and steriod injection) Pt is requesting a call back from MD when available.  Initial call taken by: Margaret Pyle, CMA,  January 28, 2010 2:07 PM  Follow-up for Phone Call        I spoke to pt. He states all symptoms have resolved.  He is requesting OV to discuss other treatment options.  I advised him schedulers will contact him with appt info on Monday. Follow-up by: Lanier Prude, [facility]),  January 28, 2010 5:14 PM  Additional Follow-up for Phone Call Additional follow up Details #1::        Agree. Thx Additional Follow-up by: Tresa Garter MD,  January 28, 2010 5:36 PM    Additional Follow-up for Phone Call Additional follow up Details #2::    He has low Vit [name] - RTC early next wk to see me and to start shots Follow-up by: Tresa Garter MD,  January 28, 2010 5:37 PM  Additional Follow-up for Phone Call Additional follow up Details #3:: Details for Additional Follow-up Action Taken: [facility] scheduler advised to move pt appt up.  Margaret Pyle, CMA  January 29, 2010 9:31 AM

## 2010-08-09 NOTE — Assessment & Plan Note (Signed)
Summary: B-12 Roena Malady [name]  Nurse Visit   Allergies: No Known Drug Allergies  Medication Administration  Injection # 1:    Medication: Vit [name] 1000 mcg    Diagnosis: [name] DEFICIENCY (ICD-266.2)    Route: IM    Site: L deltoid    Exp Date: 10/09/2011    Lot #: [id number]    Mfr: American Regent    Patient tolerated injection without complications    Given by: Lanier Prude, CMA(AAMA) (February 28, 2010 9:48 AM)  Orders Added: 1)  Admin of Therapeutic Inj  intramuscular or subcutaneous [96372] 2)  Vit [name] 1000 mcg [J3420]

## 2010-08-09 NOTE — Letter (Signed)
Summary: [facility]  [facility]   Imported By: Lester  10/06/2009 11:29:14  _____________________________________________________________________  External Attachment:    Type:   Image     Comment:   External Document

## 2010-08-09 NOTE — Assessment & Plan Note (Signed)
Summary: [name] [name]  Nurse Visit   Allergies: No Known Drug Allergies  Medication Administration  Injection # 1:    Medication: Vit [name] 1000 mcg    Diagnosis: [name] DEFICIENCY (ICD-266.2)    Route: IM    Site: R deltoid    Exp Date: 10/09/2011    Lot #: [id number]    Mfr: American Regent    Patient tolerated injection without complications    Given by: Lamar Sprinkles, CMA (March 04, 2010 10:15 AM)  Orders Added: 1)  Admin of Therapeutic Inj  intramuscular or subcutaneous [96372] 2)  Vit [name] 1000 mcg [J3420]   Medication Administration  Injection # 1:    Medication: Vit [name] 1000 mcg    Diagnosis: [name] DEFICIENCY (ICD-266.2)    Route: IM    Site: R deltoid    Exp Date: 10/09/2011    Lot #: [id number]    Mfr: American Regent    Patient tolerated injection without complications    Given by: Lamar Sprinkles, CMA (March 04, 2010 10:15 AM)  Orders Added: 1)  Admin of Therapeutic Inj  intramuscular or subcutaneous [96372] 2)  Vit [name] 1000 mcg [J3420]

## 2010-08-09 NOTE — Letter (Signed)
Summary: [facility]  [facility]   Imported By: Lester Beckett 11/25/2009 10:15:27  _____________________________________________________________________  External Attachment:    Type:   Image     Comment:   External Document

## 2010-08-12 NOTE — Letter (Signed)
Summary: Chest Tightness/[facility]  Chest Tightness/[facility]   Imported By: Sherian Rein 01/05/2009 09:32:41  _____________________________________________________________________  External Attachment:    Type:   Image     Comment:   External Document

## 2010-08-12 NOTE — Letter (Signed)
Summary: Cardiologic Eval/[facility]  Cardiologic Eval/[facility]   Imported By: Sherian Rein 12/03/2008 09:45:02  _____________________________________________________________________  External Attachment:    Type:   Image     Comment:   External Document

## 2010-08-12 NOTE — Letter (Signed)
Summary: [facility]  [facility]   Imported By: Sherian Rein 01/19/2009 09:48:32  _____________________________________________________________________  External Attachment:    Type:   Image     Comment:   External Document

## 2010-09-24 LAB — DIFFERENTIAL
Basophils Absolute: 0 10*3/uL (ref 0.0–0.1)
Basophils Relative: 0 % (ref 0–1)
Eosinophils Absolute: 0 10*3/uL (ref 0.0–0.7)
Eosinophils Relative: 1 % (ref 0–5)
Lymphocytes Relative: 17 % (ref 12–46)
Lymphs Abs: 0.7 10*3/uL (ref 0.7–4.0)
Monocytes Absolute: 0.3 10*3/uL (ref 0.1–1.0)
Monocytes Relative: 8 % (ref 3–12)
Neutro Abs: 2.9 10*3/uL (ref 1.7–7.7)
Neutrophils Relative %: 74 % (ref 43–77)

## 2010-09-24 LAB — CBC
HCT: 34.5 % — ABNORMAL LOW (ref 39.0–52.0)
Hemoglobin: 11.9 g/dL — ABNORMAL LOW (ref 13.0–17.0)
MCH: 32.9 pg (ref 26.0–34.0)
MCHC: 34.4 g/dL (ref 30.0–36.0)
MCV: 95.6 fL (ref 78.0–100.0)
Platelets: 109 10*3/uL — ABNORMAL LOW (ref 150–400)
RBC: 3.61 MIL/uL — ABNORMAL LOW (ref 4.22–5.81)
RDW: 13.1 % (ref 11.5–15.5)
WBC: 3.9 10*3/uL — ABNORMAL LOW (ref 4.0–10.5)

## 2010-09-24 LAB — BASIC METABOLIC PANEL
BUN: 20 mg/dL (ref 6–23)
CO2: 29 mEq/L (ref 19–32)
Calcium: 8.9 mg/dL (ref 8.4–10.5)
Chloride: 107 mEq/L (ref 96–112)
Creatinine, Ser: 1.18 mg/dL (ref 0.4–1.5)
GFR calc Af Amer: >60 mL/min (ref >60)
GFR calc non Af Amer: 60 mL/min — ABNORMAL LOW (ref >60)
Glucose, Bld: 118 mg/dL — ABNORMAL HIGH (ref 70–99)
Potassium: 3.8 mEq/L (ref 3.5–5.1)
Sodium: 140 mEq/L (ref 135–145)

## 2010-10-02 LAB — CREATININE, SERUM
Creatinine, Ser: 1.37 mg/dL (ref 0.4–1.5)
GFR calc Af Amer: >60 mL/min (ref >60)
GFR calc non Af Amer: 50 mL/min — ABNORMAL LOW (ref >60)

## 2010-10-14 LAB — PROTIME-INR
INR: 2.2 — ABNORMAL HIGH (ref 0.00–1.49)
Prothrombin Time: 23.8 seconds — ABNORMAL HIGH (ref 11.6–15.2)

## 2010-10-17 LAB — CBC
HCT: 32.3 % — ABNORMAL LOW (ref 39.0–52.0)
Hemoglobin: 11.3 g/dL — ABNORMAL LOW (ref 13.0–17.0)
MCHC: 35 g/dL (ref 30.0–36.0)
MCV: 96.2 fL (ref 78.0–100.0)
Platelets: 90 10*3/uL — ABNORMAL LOW (ref 150–400)
RBC: 3.36 MIL/uL — ABNORMAL LOW (ref 4.22–5.81)
RDW: 13.6 % (ref 11.5–15.5)
WBC: 3.7 10*3/uL — ABNORMAL LOW (ref 4.0–10.5)

## 2010-10-17 LAB — BASIC METABOLIC PANEL
BUN: 10 mg/dL (ref 6–23)
CO2: 26 mEq/L (ref 19–32)
Calcium: 8.5 mg/dL (ref 8.4–10.5)
Chloride: 109 mEq/L (ref 96–112)
Creatinine, Ser: 1.08 mg/dL (ref 0.4–1.5)
GFR calc Af Amer: >60 mL/min (ref >60)
GFR calc non Af Amer: >60 mL/min (ref >60)
Glucose, Bld: 136 mg/dL — ABNORMAL HIGH (ref 70–99)
Potassium: 3.7 mEq/L (ref 3.5–5.1)
Sodium: 141 mEq/L (ref 135–145)

## 2010-10-17 LAB — PROTIME-INR
INR: 1.3 (ref 0.00–1.49)
Prothrombin Time: 16.5 seconds — ABNORMAL HIGH (ref 11.6–15.2)

## 2010-10-17 LAB — PLATELET COUNT: Platelets: 98 10*3/uL — ABNORMAL LOW (ref 150–400)

## 2010-11-22 NOTE — Discharge Summary (Signed)
[name], [name]           ACCOUNT NO.:  [id number]   MEDICAL RECORD NO.:  [id number]          PATIENT TYPE:  INP   LOCATION:  2508                         FACILITY:  [facility]   PHYSICIAN:  Antonieta Iba, MD   DATE OF BIRTH:  [birth year]   DATE OF ADMISSION:  12/23/2008  DATE OF DISCHARGE:  12/24/2008                               DISCHARGE SUMMARY   DISCHARGE DIAGNOSES:  1. Increasing angina.  2. Coronary artery disease with previously placed stents to left      anterior descending and circumflex.      a.     An 80% eccentric stenosis of proximal left circumflex,       undergoing percutaneous transluminal coronary angioplasty and       stent deployment with a Multi-Link Vision stent.  3. Left ventricular dysfunction, ejection fraction 45%.  4. Atrial fibrillation, rate controlled.  5. Anticoagulation with Coumadin, we will continue.  6. Mild to moderate mitral regurgitation.  7. Dyslipidemia.  8. Wide-complex tachycardia, most likely atrial fibrillation with      aberrancy.   DISCHARGE CONDITION:  Improved.   PROCEDURE:  December 23, 2008, elective cardiac catheterization by Dr. Tresa Endo  with stent deployment to the proximal circumflex.  Previously placed  stents were patent and ejection fraction was 45%.   The patient underwent the procedure and tolerated it well.  Postprocedure, he did have 12-beat run of wide-complex tachycardia, most  likely aberrancy.  He had 2 episodes of chest pain, 1 was on the right  side, brief; second one was 8/10, sharp substernal chest pain.  One  sublingual nitroglycerin given with relief and EKG without change.   By the morning of December 24, 2008, he was stable, ready to be discharged,  ambulate with cardiac rehab without complications.  He was seen and  evaluated by Dr. Tresa Endo and will be discharged.   DISCHARGE MEDICATIONS:  1. Coreg 25 mg twice a day.  2. Vytorin 10/40 daily.  3. Amiodarone 200 mg daily.  4. Continue aspirin 81 mg daily  until December 27, 2008, then stop.  5. Plavix 75 mg daily for 1 month, do not stop during that month.  6. Coumadin 5 mg daily, beginning on December 24, 2008.  7. You may stop the Imdur.   DISCHARGE INSTRUCTIONS:  1. Low-sodium heart-healthy diet.  2. Wash cath site with soap and water.  Call if any bleeding,      swelling, or drainage.  3. Increase activity slowly.  May shower.  No lifting for 1 week.  No      driving for 2 days.  4. Follow up with Dr. Tresa Endo on January 04, 2009, at 10:15.  5. Follow up with Coumadin Clinic at [facility]      on December 28, 2008, at 2:20 p.m.   LABORATORY DATA:  Hemoglobin at discharge 11.3, hematocrit 32.3, WBC  3.7, platelets 90, MCV 96.2.  Chemistry:  Sodium 141, potassium 3.7,  chloride 109, CO2 26, BUN 10, creatinine 1.08, glucose 136.  INR 1.3,  pro time 16.5, calcium 8.5.   Chest x-ray back on the  11th, airway thickening, may reflect bronchitis  reactive airway disease, probable scarring at the right lung base.   VITAL SIGNS AT DISCHARGE:  Blood pressure 136/67, pulse 76, respirations  18, temperature 97.9, oxygen saturation on room air 94%.  Examined by Dr. Tresa Endo.  No JVD.  Lung without rales.  Heart irregularly  irregular with 1/6 systolic ejection murmur.  Right groin cath site  stable.  No lower extremity edema.  Abdomen soft.   EKG:  AFib, septal infarct age undetermined, but no acute changes from  previous tracing.  Heart rate 67, it has been as low as 54.   One episode of 12-beat wide-complex tachycardia.  Dr. Tresa Endo felt this  most likely aberrancy.      [name]. [name], N.P.      Antonieta Iba, MD  Electronically Signed    LRI/MEDQ  D:  12/24/2008  T:  12/25/2008  Job:  [id number]   cc:   Georgina Quint. Plotnikov, MD  Nicki Guadalajara, M.D.

## 2010-11-22 NOTE — Discharge Summary (Signed)
NAMERAYLIN, WINER           ACCOUNT NO.:  [id number]   MEDICAL RECORD NO.:  [id number]          PATIENT TYPE:  INP   LOCATION:  2508                         FACILITY:  [facility]   PHYSICIAN:  Antonieta Iba, MD   DATE OF BIRTH:  08/25/1929   DATE OF ADMISSION:  12/23/2008  DATE OF DISCHARGE:  12/24/2008                               DISCHARGE SUMMARY   ADDENDUM   The patient currently is not on ACE or ARB, and we will address that as  an outpatient and allow his kidneys to process the dye first.      [name]. [name], N.P.      Antonieta Iba, MD  Electronically Signed    LRI/MEDQ  D:  12/24/2008  T:  12/25/2008  Job:  [id number]   cc:   Dr. Elroy Channel. Plotnikov, MD

## 2010-11-22 NOTE — Cardiovascular Report (Signed)
NAMEJOHARI, PINNEY           ACCOUNT NO.:  [id number]   MEDICAL RECORD NO.:  [id number]          PATIENT TYPE:  INP   LOCATION:  2508                         FACILITY:  [facility]   PHYSICIAN:  Nicki Guadalajara, M.D.     DATE OF BIRTH:  May 02, 1930   DATE OF PROCEDURE:  12/23/2008  DATE OF DISCHARGE:                            CARDIAC CATHETERIZATION   INDICATIONS:  [name] is a 77 year old gentleman, who has  a history of known coronary artery disease.  In 1991, he underwent  directional coronary atherectomy of a high-grade eccentric LAD lesion.  He is status post intervention to the circumflex marginal vessel.  In  December 2002, he suffered an anterior wall myocardial infarction and  underwent stenting of his proximal to mid LAD.  He was documented to  have scar in the apical portion of the inferior wall.  Since April 2008,  he has been documented to be in atrial fibrillation of questionable  duration.  He was started on Coumadin therapy at that time.  Recently,  the patient has noticed definite exertionally precipitated chest  tightness.  Due to his worrisome symptoms of progressive exertional  angina, he was seen in the office on December 18, 2008, and plans were made  for definitive cardiac catheterization.  His last Coumadin dose was June  12 and since June 12, he has been taking aspirin plus Plavix and not  Coumadin.  He presents now for catheterization.  INR today is 1.3.   PROCEDURE:  After premedication with Valium 5 mg intravenously, the  patient was prepped and draped in usual fashion.  His right femoral  artery was punctured anteriorly and a 5-French sheath was inserted  without difficulty.  Diagnostic cardiac catheterization was done  utilizing 5-French Judkins 4 left and right coronary catheters.  A 5-  French pigtail catheter was used for biplane cine left ventriculography  as well as distal aortography.   With the demonstration of new progressive eccentric  80% proximal  circumflex stenosis, which most likely is accountable for the patient's  change in symptomatology, angiographic studies were reviewed with the  patient, and decision was to pursue coronary intervention.  His arterial  sheath was upgraded to a 6-French system.  The patient received an  additional 150 mg of oral Plavix and was started on Angiomax for  anticoagulation.  ACT was documented to be therapeutic.  He received an  additional 50 mcg of fentanyl for sedation.  A 6-French Voda 4 guide was  used for the intervention.  A Prowater wire was advanced down the  circumflex vessel.  Predilatation was done in the proximal circumflex  with a 2.5 x 12 mm apex balloon.  An IC nitroglycerin was administered.  A 3.0 x 12 mm non-DES Vision stent was inserted since the patient will  most likely need to be back on Coumadin therapy to reduce long-term need  for Plavix treatment.  Dilatation was done at 13 and 14 atmospheres.  A  3.5 x 8 mm noncompliant Voyager balloon was used for poststent  dilatation up to 3.48 mm size.  Scout angiography confirmed an excellent  angiographic result.  There was brisk TIMI 3 flow.  There was no  evidence for dissection.  The patient tolerated the procedure well.   HEMODYNAMIC DATA:  Central aortic pressure was 113/62.  Left ventricular  pressure was 113/17, post A-wave 20.   ANGIOGRAPHIC DATA:  Left main coronary artery was angiographically  normal and bifurcated into an LAD and left circumflex system.   The LAD gave rise to a proximal diagonal vessel and a proximal large  septal perforating artery.  Just beyond this in the proximal to mid LAD,  was a widely patent previously placed stent.  The LAD otherwise was  normal and wrapped around the apex.   The circumflex vessel gave rise to a prominent marginal vessel.  In the  very proximal circumflex, there was 80% eccentric stenosis before the  OM1 takeoff.  Beyond the stenosis, there did appear to be  some ectasia  to the proximal circumflex before the OM1 takeoff.  The stent in the OM1  was widely patent.  There was mild 20% narrowing in the AV groove just  after the OM1 takeoff.   The right coronary artery was angiographically normal and gave rise to a  small PDA system.  The PLA was supplied by the circumflex vessel.   Biplane cine selective left ventriculography revealed mild LV  dysfunction with an ejection fraction approximately 45%.  There was mild  residual distal anterior hypocontractility and apical hypocontractility  on the RAO projection.  On the LAO projection, there was low  posterolateral hypocontractility.   Distal aortography revealed widely patent renal arteries.  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   Following percutaneous coronary intervention to the circumflex vessel,  the eccentric 80% stenosis was reduced to 0% with ultimate insertion of  a non-DES 3.0 x 12 mm Vision stent postdilated to 3.48 mm with the 3.5 x  8 mm noncompliant Voyager balloon.  There was brisk TIMI 3 flow.  There  was no evidence for dissection.   IMPRESSION:  1. Mild left ventricular dysfunction with residual mid distal apical      hypocontractility and low posterolateral hypocontractility.  2. Widely patent stent in the proximal to mid left anterior      descending.  3. New eccentric 80% proximal circumflex stenosis before the first      obtuse marginal artery vessel with a widely patent stent in the      first obtuse marginal artery vessel and mild 20% narrowing in the      atrioventricular groove circumflex after the first obtuse marginal      artery takeoff.  4. Normal right coronary artery.  5. 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  6. Successful percutaneous coronary intervention of the proximal      circumflex vessel with percutaneous transluminal  coronary      angioplasty/stenting and insertion of a non-drug eluting stent 3.0      x 12 mm Vision stent postdilated to 3.48 mm done with      Integrilin/oral Plavix/intracoronary nitroglycerin.           ______________________________  Nicki Guadalajara, M.D.     TK/MEDQ  D:  12/23/2008  T:  12/24/2008  Job:  [id number]

## 2010-11-25 NOTE — H&P (Signed)
NAME:  [name], [name]                     ACCOUNT NO.:  [id number]   MEDICAL RECORD NO.:  [id number]                   PATIENT TYPE:  INP   LOCATION:  1825                                 FACILITY:  [facility]   PHYSICIAN:  Madaline Savage, M.D.             DATE OF BIRTH:  10-07-1929   DATE OF ADMISSION:  07/09/2003  DATE OF DISCHARGE:                                HISTORY & PHYSICAL   CHIEF COMPLAINT:  Chest pain, diaphoresis, shortness of breath.   BRIEF HISTORY:  The patient is a 77 year old white male who was recently  discharged home on June 25, 2003.  He was admitted on June 20, 2003,  with an acute MI and 100% LAD stenosis after the first diagonal.  He  underwent cardiac catheterization, PTA, and placement of a CYPHER stent on  June 21, 2003.  Postprocedure, he had a brief episode of atrial  fibrillation which converted on Cardizem.  He subsequently was discharged  home on June 25, 2003, and has been seen in the [place] [facility] by  Darlin Priestly, M.D., on one occasion since.   The patient complains of chest pain almost daily since discharge.  It can  occur at rest, also with activity.  Occasionally, he has had shortness of  breath with this but it has not been severe enough to take nitroglycerin.  He describes his pain as a pressure.  Today, he drove his wife to see Gregary Signs  A. Everardo All, M.D. [facility], and while she was in the office, he developed pain  over his anterior chest, which he said was a crushing pressure type of pain.  It lasted 15 minutes and he took a nitroglycerin with no relief.  He took a  second nitroglycerin after approximately eight minutes, by his estimate.  He  was having shortness of breath with his original pain and after his second  nitroglycerin, he also developed nausea and diaphoresis.  His wife came out  to the car and he told her he was in trouble and then had a loss of  consciousness.  Staff at Dr. George Hugh office transferred him  to the back  seat and he was brought to the ER.   The patient is in the ER now.  His pain is better.  He feels somewhat short  of breath in the ER.  EKG shows T-wave inversion in leads I, aVL, V2 through  V6.  He has Q-waves in V1 through V4.  His first troponin was 0.11, CK was  less than 1, myoglobin was 42.  He is admitted now for treatment of possible  anterior lateral MI and immediate cardiac catheterization.   PAST MEDICAL HISTORY:  1. Appendectomy.  2. Cancer of the bladder in 1994.  3. History of liver biopsy, which was negative in January 2004.   ALLERGIES:  None.   HABITS:  ETOH: None.  Cigarettes: None.   CURRENT MEDICATIONS:  1. Aspirin 325 mg  daily.  2. Coreg 6.25 mg daily.  3. Plavix 75 mg daily.  4. Zetia 10 mg daily.  5. Protonix 40 mg daily.  6. Altace 2.5 mg b.i.d.  7. Zocor 40 mg daily.   FAMILY HISTORY AND SOCIAL HISTORY:  Noncontributory.   REVIEW OF SYSTEMS:  CV Negative.  PULMONARY: None except for shortness of  breath with occasional chest pain.  CARDIAC: As above.  GI: Negative.  GU:  Negative.  EXTREMITIES: No edema.   PHYSICAL EXAMINATION:  GENERAL:  A 77 year old white male, well nourished,  well developed, no acute distress.  VITAL SIGNS:  Blood pressure currently sitting on the stretcher is 98/55,  heart rate 82, respirations 18 and unlabored, 02 saturations 99% on  admission on room air, temperature 96.  HEENT:  Head: Normocephalic.  Eyes: PERRLA.  EOM intact.  Fundi not  visualized.  Ears, nose, throat, and mouth: Grossly within normal limits.  NECK:  No bruits, no thyromegaly, no JVD.  CHEST:  Clear to auscultation and percussion.  HEART:  No murmurs, rubs, or gallops.  ABDOMEN:  Soft, nontender, positive bowel sounds, no hepatosplenomegaly.  EXTREMITIES:  No cyanosis, clubbing, or edema.  Palpable distal pulses.  He  has some ecchymosis in his right groin from previous catheterization.  NEUROLOGIC:  No focal deficits.   IMPRESSION:   1. Acute anterior lateral myocardial infarction, questionable stent to left     anterior descending stenosis/occlusion.  2. Acute anterior myocardial infarction with percutaneous transluminal     coronary angioplasty stent to the proximal and mid left anterior     descending June 21, 2003.  3. History of bladder cancer.  4. History of atrial fibrillation, resolved.   PLAN:  The patient was seen in the emergency room and evaluated by Madaline Savage, M.D.  He has been placed on heparin.  IV fluids have been  increased.  Nitroglycerin and Integrillin have been initiated.  We plan to  take him to the catheterization lab as soon as possible.  The patient last  ate at 9 a.m.; the time is currently 2:20.      Eber Hong, P.A.                 Madaline Savage, M.D.    WDJ/MEDQ  D:  07/09/2003  T:  07/09/2003  Job:  [id number]

## 2010-11-25 NOTE — Cardiovascular Report (Signed)
NAME:  [name], [name]                     ACCOUNT NO.:  [id number]   MEDICAL RECORD NO.:  [id number]                   PATIENT TYPE:  INP   LOCATION:  2108                                 FACILITY:  [facility]   PHYSICIAN:  Thereasa Solo. Little, M.D.              DATE OF BIRTH:  06/17/1930   DATE OF PROCEDURE:  07/09/2003  DATE OF DISCHARGE:                              CARDIAC CATHETERIZATION   INDICATIONS FOR TEST:  [name] is a 77 year old male who presented  June 21, 2003 with an anterior myocardial infarction and had a stent  placed to his LAD.  He has complained of intermittent chest discomfort since  his discharge, but today developed severe chest pain while waiting on his  wife at the doctor's.  He took two nitroglycerin tablets and was found  unconscious underneath his steering wheel.  In the emergency room his EKG  showed new T-wave inversion in the anterior leads that were not present at  the time of his discharge following his June 23, 2003 MI.   He has complained for years of episodes of his heart pounding and skipping,  predominantly the last 18-24 months.   Before he was brought from the emergency room to the catheterization  laboratory he was reported to have an episode of wide complex tachycardia.  It made him feel lightheaded.  He was given 100 mg of IV lidocaine.  It  resolved.  No rhythm strip was obtained for documentation of what this  rhythm was.   When he presented to the catheterization laboratory he was in atrial  flutter.  His rate was about 85 and he spontaneously converted back to sinus  rhythm without medical therapy.   PROCEDURE:  The patient was prepped and draped in the usual sterile fashion  exposing the both groins.  Following local anesthetic 1% Xylocaine the  Seldinger technique was employed and a 5-French introducer sheath was placed  into the left femoral artery.  Left and right coronary arteriography and  ventriculography in both  the RAO and LAO projection was performed.   COMPLICATIONS:  None.   EQUIPMENT:  5-French Judkins configuration catheters.   RESULTS:  HEMODYNAMIC MONITORING:  Central aortic pressure was 124/76.  Left ventricular pressure was 124/11  with no aortic valve gradient noted at time of pullback.  Heart rate during  his cardiac catheterization was in the 70s and remained sinus.   VENTRICULOGRAPHY:  Ventriculography in both the RAO and LAO projection using 25 mL of contrast  at 12 mL/second revealed normal good opacification left ventricle.  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  There was no evidence of a thrombus in this  area.  The ejection fraction was approximately 45% and the end-diastolic  pressure was 16.  No mitral regurgitation was seen.   CORONARY ARTERIOGRAPHY  On fluoroscopy a large stent was noted in the distribution of the LAD.  1. Left main normal.  2. LAD:  The LAD had a long 3.0 x 33 Cypher stent in the proximal segment.     There was minor irregularities within the stent.  The distal vessel had     minor irregularities.  There was brisk TIMI 3 flow and no high grade     stenosis.  The first diagonal was free of disease and of small to     moderate size.  3. Circumflex:  The circumflex was a large vessel.  There was a stent in the     proximal portion of the first OM (3.0 x 12 Express stent).  The stent was     widely patent as was the distal portion of the OM which bifurcated.  The     ongoing circumflex gave rise to a very distal large OM vessel that was     free of disease.  The body of the ongoing circumflex was also free of     disease.  Multiple pictures of the ostium of the ongoing circumflex     following the OM were obtained but I never documented significant high     grade stenoses.  There was some minor narrowing.  4. Right coronary artery:  The right coronary artery gave rise to a     posterior descending artery and a small posterolateral branch.  This      vessel had only minimal irregularities in its proximal portion.   CONCLUSION:  1. 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  No evidence of thrombus     at the apex of the heart.  2. Widely patent stent in the left anterior descending.  3. Widely patent stent in the obtuse marginal.  There is clearly nothing to     explain his chest pain from a coronary anatomy standpoint.   Because of his atrial flutter which is documented on rhythm strips in the  catheterization laboratory, I suggest increasing his Coreg.  I have ordered  a TSH and I am currently looking for a recent 2-D echocardiogram.   Regarding the wide complex tachycardia seen in the emergency room, I cannot  comment on this.  There was no documentation.  However, we will monitor this  patient for at least 36 hours to make sure he does not have malignant  arrhythmias following his myocardial infarction June 23, 2003.                                               Thereasa Solo. Little, M.D.    ABL/MEDQ  D:  07/09/2003  T:  07/10/2003  Job:  [id number]   cc:   Nicki Guadalajara, M.D.  [phone] [address]  [place], [place] [address]  Fax: [phone]   Darlin Priestly, M.D.  [phone] [address]  [place]  [place] [address]  Fax: [phone]   Cath Lab

## 2010-11-25 NOTE — Op Note (Signed)
NAME:  [name], [name]           ACCOUNT NO.:  [id number]   MEDICAL RECORD NO.:  [id number]          PATIENT TYPE:  AMB   LOCATION:  ENDO                         FACILITY:  [facility]   PHYSICIAN:  Anselmo Rod, M.D.  DATE OF BIRTH:  [birth year]   DATE OF PROCEDURE:  03/03/2005  DATE OF DISCHARGE:                                 OPERATIVE REPORT   PROCEDURE PERFORMED:  Colonoscopy with cold biopsies x4.   ENDOSCOPIST:  Anselmo Rod, M.D.   INSTRUMENT USED:  Olympus video colonoscope.   INDICATIONS FOR PROCEDURE:  A 77 year old white male with a personal history  of bladder cancer undergoing a screening colonoscopy to rule out colon  polyps, masses, etc.   PREPROCEDURAL PREPARATION:  Informed consent was procured from the patient.  The patient fasted for eight hours prior to the procedure and prepped with a  bottle of magnesium citrate and a gallon of GoLYTELY the night prior to the  procedure.  Risks and benefits of the procedure, including a 10% missed rate  of cancer and polyp was discussed with the patient as well.   PREPROCEDURAL PHYSICAL EXAMINATION:  VITAL SIGNS:  Stable.  NECK:  Supple.  CHECK:  Clear to auscultation.  CARDIAC:  S1 and S2.  Regular.  ABDOMEN:  Soft with normal bowel sounds.   DESCRIPTION OF PROCEDURE:  Patient was placed in the left lateral decubitus  position.  Sedated with 70 mg of Demerol and 5 mg of Versed in slow,  incremental doses.  Once the patient was adequately sedated, maintained on  low flow oxygen.  Continuous cardiac monitoring.  The Olympus video  colonoscope was advanced from the rectum to the cecum.  The appendiceal  orifices and ileocecal valve were clearly visualized and photographed.  There was some residual stool in the colon.  Multiple washings were done.  There was evidence of diverticulosis throughout the colon with early  scattered diverticula seen predominantly in the sigmoid colon.  Two small  sessile polyps were biopsied  in the proximal right colon with cold biopsies  x4.  Small internal hemorrhoids were seen on retroflexion of the rectum.  The patient tolerated the procedure well without complications.  There was  some residual stool in the colon.  Multiple washings were done.   IMPRESSION:  1.  Scattered diverticulosis throughout the colon with more prominent change      in the sigmoid colon.  2.  Small nonbleeding internal hemorrhoids.  3.  Two small sessile polyps biopsied from proximal right colon (cold      biopsies x4).   RECOMMENDATIONS:  1.  Await pathology results.  2.  Avoid nonsteroidals including aspirin, for the next two weeks.  3.  Outpatient followup as need rises in the future.  Repeat colonoscopy      will be done, depending on pathology results.      Anselmo Rod, M.D.  Electronically Signed     JNM/MEDQ  D:  03/03/2005  T:  03/03/2005  Job:  [id number]   cc:   Georgina Quint. Plotnikov, M.D. [facility]  [address]  [place]  [place]  [address]   Nicki Guadalajara, M.D.  [address], [address]  [place], [place] [address]  Fax: [phone]   Lucrezia Starch. Earlene Plater, M.D.  [address]  [place]  [place] [address]  Fax: [phone]

## 2010-11-25 NOTE — H&P (Signed)
NAME:  [name], [name]           ACCOUNT NO.:  [id number]   MEDICAL RECORD NO.:  [id number]          PATIENT TYPE:  INP   LOCATION:  1845                         FACILITY:  [facility]   PHYSICIAN:  Ulyses Amor, MD DATE OF BIRTH:  10-17-1929   DATE OF ADMISSION:  07/27/2004  DATE OF DISCHARGE:                                HISTORY & PHYSICAL   [name] is a 77 year old white man who was admitted to [facility] for further evaluation of chest pain.   The patient has a history of coronary artery disease which dates back to  77.  At that time he suffered a myocardial infarction and subsequently  underwent LAD atherectomy.  In 2002 he suffered another myocardial  infarction which lead to stenting of the obtuse marginal.  In 2004, he  suffered another myocardial infarction which lead to stenting of the LAD.  His last cardiac catheterization was in October of 2005 revealed a 40%  stenosis in the LAD stent.  There was stenosis of 30%, 40%, and 20%  elsewhere in the LAD.   The patient presented to the emergency department with a 3-day history of  intermittent chest pain.  During this time he has experienced his typical  cardiac chest pain.  This is described as a mid-substernal pressure.  It  does not radiate.  It has been associated at times with dyspnea, nausea, and  diaphoresis.  Episodes of chest pain would last several minutes to several  hours, resolve, and then worsen again in a recurrent pattern.  Last night he  had a particularly severe episode of chest pain.  He took three  nitroglycerine tablets.  Additional nitroglycerine was administered by EMS.  None of the nitroglycerine helped to relieve his chest pain.  Ultimately, he  was given morphine and more nitroglycerine which relieved his chest pain.  His chest pain has largely resolved at this time.  There are no other  exacerbating or immediately relating factors with respect to his chest pain.  Chest pain  appeared not to be related to position, activity, meals, or  respiration.   There is no history of arrhythmia (other than one episode during his first  percutaneous procedure) or clinical congestive heart failure.   PAST MEDICAL HISTORY:  Notable for bladder cancer which was treated a number  of years ago.   MEDICATIONS:  Plavix, Zocor, aspirin, Lotrel, and Coreg.   ALLERGIES:  None.   FAMILY HISTORY:  Noncontributory.   The patient lives with his wife.  He neither smokes nor drinks.   REVIEW OF SYSTEMS:  Revealed no new problems related to his HEAD, EYES,  EARS, NOSE, MOUTH, THROAT, LUNGS, GASTROINTESTINAL SYSTEM, GENITOURINARY  SYSTEM, or EXTREMITIES.  There is no history of NEUROLOGIC OR PSYCHIATRIC disorder.  There is no history of fever, chills, or weight loss.   PHYSICAL EXAMINATION:  VITAL SIGNS:  Blood pressure 124/78.  Pulse 58 and  regular.  Respirations 14.  Temperature 98.6.  GENERAL:  The patient was an elderly white man in no discomfort.  He was  alert, oriented, appropriate and responsive.  HEAD, EYES, NOSE,  and MOUTH:  Normal.  NECK:  Is without thyromegaly or adenopathy.  Carotid pulses were palpable  bilaterally and without bruits.  CARDIAC EXAMINATION:  Revealed a normal S1 and S2.  There was no S3, S4,  murmur, rub or click.  Cardiac rhythm was regular.  No chest wall tenderness  was noted.  LUNGS:  Clear.  ABDOMEN:  Soft and nontender.  There was no mass, hepatosplenomegaly, bruit,  distension, rebound, guarding, or rigidity.  Bowel sounds were normal.  RECTAL AND GENITAL EXAMINATIONS:  Were not performed as they were not  pertinent for the reason for acute care hospitalization.  EXTREMITIES:  Were without edema, deviation, or deformity.  Radial and  dorsalis pedis pulses were palpable bilaterally.  NEUROLOGIC:  The screening neurologic survey was unremarkable.   The electrocardiogram revealed anteroseptal Q waves consistent with his  prior myocardial  infarctions.  The inferior leads revealed minimal (less  than or equal to be 1 mm) ST segment elevation.  An electrocardiogram was  repeated 26 minutes after the initial one with still this negligible amount  of inferior ST elevation.  The chest radiograph, according to the  radiologist, demonstrated no evidence of acute disease.   All laboratory work was pending at the time of this dictation.   IMPRESSION:  1.  Chest pain; rule out unstable angina.  2.  Coronary artery disease.  Status post myocardial infarction in 1991      resulting in left anterior descending atherectomy.  Status post      myocardial infarction in 2002 resulting in obtuse marginal stenting.      Status post myocardial infarction in 2004 resulting in left anterior      descending stenting.  The last cardiac catheterization in October of      2005 revealed a 40% stenosis in the left anterior descending stent with      other lesions elsewhere in the left anterior descending; medical therapy      recommended.  3.  Hypertension.  4.  Dyslipidemia.  5.  Status post bladder cancer.   PLAN:  1.  Telemetry.  2.  Serial cardiac enzymes.  3.  Aspirin.  4.  Intravenous nitroglycerine.  5.  Intravenous heparin.  6.  Further measures per Dr. Tresa Endo.      [facility]   MSC/MEDQ  D:  07/27/2004  T:  07/27/2004  Job:  [id number]   cc:   Nicki Guadalajara, M.D.  [phone] [address], [address]  [place], [place] [address]  Fax: [phone]

## 2010-11-25 NOTE — Cardiovascular Report (Signed)
NAME:  [name], [name]                     ACCOUNT NO.:  [id number]   MEDICAL RECORD NO.:  [id number]                   PATIENT TYPE:  INP   LOCATION:  2925                                 FACILITY:  [facility]   PHYSICIAN:  Darlin Priestly, M.D.             DATE OF BIRTH:  12/18/1929   DATE OF PROCEDURE:  06/21/2003  DATE OF DISCHARGE:                              CARDIAC CATHETERIZATION   PROCEDURES PERFORMED:  1. Left heart catheterization.  2. Coronary angiography.  3. Left ventriculogram.  4. Left anterior descending as proximal - percutaneous transluminal coronary     balloon angioplasty - placement of intracoronary stent.   CARDIOLOGIST:  Darlin Priestly, M.D.   COMPLICATIONS:  None.   INDICATIONS:  [name] is a 77 year old male, patient of Dr. Daphene Jaeger, with a history of hyperlipidemia, history of CAD, status post [facility] of  the LAD in 1991 with angioplasty following restenosis.  The patient has also  had stenting of his first obtuse marginal in December 2002 by Dr. Tresa Endo  using the Express-2 stent.  The patient recently saw Dr. Tresa Endo, and was  doing well, in the office.  He presented to the ER on June 20, 2003 with  stuttering chest pain.  Initial enzymes were negative with no acute ST  changes.  He did subsequently rule in for myocardial infarction with ongoing  pain and is now brought to the cardiac cath lab to assess his coronary  status.   DESCRIPTION OF PROCEDURE:  After getting informed written consent the  patient was brought to the cardiac cath lab.  The right and left groins were  shaved, prepped and draped in the usual sterile fashion.  ECG monitors were  established.  Using modified Seldinger technique a #7 French arterial sheath  was inserted into the right femoral artery.  Six French diagnostic catheter  was used to perform diagnostic angiography.   This revealed a large left main with no significant disease.   The LAD is a medium-sized  vessel, which is totally occluded after giving  rise to one diagonal branch.  There is calcification noted at the occlusion.   The first diagonal is a small vessel, which bifurcates distally with no  significant disease.   The left circumflex is  large vessel coursing the AV groove and goes to two  obtuse marginal branches.  The AV groove circumflex  has no significant  disease.  The first OM in a large vessel, which bifurcates distally.  It is  noted to have a stent in its ostial and proximal portion, which appears  widely patent.  The second obtuse marginal is a large vessel with no  significant disease.   The right coronary artery is a medium-sized vessel, which is dominant and  gives rise to a PDA as well as posterolateral branch.  There is mild 30%  midvessel narrowing.  The PDA and posterolateral branch have no significant  disease.  There are very faint right and left collaterals to the apical LAD.   Left ventriculogram reveals a moderately depressed EF of 40-45% with  anterolateral and apical hypokinesis.   HEMODYNAMIC DATA:  Systemic arterial pressure 115/67.  LV systemic pressure  122/19.  LVEDP of 31.   INTERVENTIONAL PROCEDURE:  LEFT ANTERIOR DESCENDING - PROXIMAL:  Following diagnostic angiography a #7 [place] JL-4 guiding catheter was then  cautiously engaged in the left coronary ostium.  Next, a 0.014 short Forte  guidewire was then used to cross the total occlusion and positioned in the  apical LAD without difficulty.  Following this a Maverick 2.5 x 15 mm  balloon was then tracked across the total occlusion and approximately six  inflations were performed throughout the mid and proximal LAD up to 10  atmospheres for a total of approximately three minutes.  Follow up angiogram  revealed some thrombus present with TIMI III flow into the distal vessel.  This balloon was then used to make measurements for the stent and then it  was then removed.  We then placed a CYPHER  3.0 x 33 mm stent into the  proximal LAD extending down to the second diagonal.  It should be noted that  once the LAD was opened there appeared to be a long sequential 60% lesion  throughout the mid LAD.  The remainder of the LAD was patent, but irregular.  This CYPHER stent was then deployed to a maximum of 12 atmospheres for a  total of 35 seconds.  We did do one post inflation at 12 atmospheres for a  total of 40 seconds.  Follow up angiogram  revealed no evidence of  dissection or thrombus, and TIMI III flow to the distal vessel.  IV Angiomax  was used throughout the case.   The patient was loaded with 600 mg of Plavix.  IV heparin was given to  maintain the ACT between 200 and 300.   Final orthogonal angiograms reveled less than 10% residual stenosis in the  proximal and mid LAD at the bifurcation with TIMI III flow to the distal  vessel.  At this point we elected to conclude the procedure.   All balloons, wires and catheters were removed.  Hemostatic sheaths were  sewn in place and the patient was transferred back to ward in stable  condition.   CONCLUSIONS:  1. Successful percutaneous transluminal coronary balloon angioplasty and     placement of a CYPHER 3.0 x 33 millimeter stent in the mid and proximal     left anterior descending stenotic lesion.  2. Widely patent first obtuse marginal stent.  3. Mild depressed left ventricular systolic function with wall motion     abnormalities as noted above.  4. Adjuvant use of Integrilin infusion.  5. Elevated left ventricular end diastolic pressure.                                               Darlin Priestly, M.D.    RHM/MEDQ  D:  06/21/2003  T:  06/21/2003  Job:  [id number]   cc:   Nicki Guadalajara, M.D.  [phone], [address]  [place], [place] [address]  Fax: [phone]

## 2010-11-25 NOTE — Op Note (Signed)
NAMEJAKOREY, [name]           ACCOUNT NO.:  [id number]   MEDICAL RECORD NO.:  [id number]          PATIENT TYPE:  INP   LOCATION:  2039                         FACILITY:  [facility]   PHYSICIAN:  Lebron Conners, M.D.   DATE OF BIRTH:  Sep 01, 1929   DATE OF PROCEDURE:  12/07/2005  DATE OF DISCHARGE:                                 OPERATIVE REPORT   PREOPERATIVE DIAGNOSIS:  Cholelithiasis and acute cholecystitis.   POSTOPERATIVE DIAGNOSIS:  Cholelithiasis and acute cholecystitis.   OPERATION:  Laparoscopic cholecystectomy.   SURGEON:  Lebron Conners, M.D.   ANESTHESIA:  General and local.   COMPLICATIONS:  None.   SPECIMEN:  Gallbladder.   BLOOD LOSS:  Minimal.   PROCEDURE:  After the patient was monitored and had general anesthesia and  routine preparation and draping of the abdomen, I infiltrated the area below  the umbilicus with local anesthetic and made a 2 cm transverse incision and  then a 2 cm vertical incision in the midline fascia and bluntly entered the  peritoneum.  Viscera in the area were not adherent.  I placed a 0 Vicryl  pursestring suture in the fascia and secured a Hassan cannula and inflated  the abdomen with carbon dioxide.  Under direct view, I placed three  additional laparoscopic ports and noted that the gallbladder was acutely  inflamed, thick walled and tense.  I then decompressed the gallbladder with  a suction aspirator and was able to grasp it and elevate the fundus.  I took  down adhesions to the under surface and was able to see the infundibulum.  I  pulled the infundibulum laterally and then dissected the hepatoduodenal  ligament until I first clearly identified the cystic artery which was  atypical in location being lateral to the cystic duct.  I clipped it with  four clips and divided between the two closest to the gallbladder.  I then  dissected at the cystic duct and worked until I saw it clearly emerging from  the infundibulum of the  gallbladder.  Then, I clipped it with four clips and  divided it between the two closest to the gallbladder.  I then used the  cautery and the hook dissector to separate the gallbladder from the liver  and got hemostasis as I carried the dissection of upward toward the fundus.  After detaching the gallbladder from the liver, I placed it in a plastic  pouch and held it over the liver while I copiously irrigated the gallbladder  fossa and the right upper quadrant and then removed the irrigant.  The clips  were secure and hemostasis appeared to be excellent.  I then withdrew the  gallbladder from the body through the umbilical incision and tied the  pursestring suture.  The closure was secure.  I then passed a piece of  Surgicel into the abdomen and placed it into the gallbladder fossa.  I  removed a little bit of the remaining irrigant and then removed the two  lateral  ports under direct view.  After allowing the carbon dioxide to escape, I  removed the epigastric port and Ms. Bowman closed  all the skin incisions  with intracuticular 4-0 Vicryl and Steri-Strips.  The patient was stable  throughout the operation.      Lebron Conners, M.D.  Electronically Signed     WB/MEDQ  D:  12/07/2005  T:  12/07/2005  Job:  [id number]   cc:   Anselmo Rod, M.D.  Fax: [phone]   Richard A. Alanda Amass, M.D.  Fax: [phone]   Olene Craven, M.D.  Fax: [phone]   Georgina Quint. Plotnikov, M.D. [facility]  [address]  [place]  [place] [address]

## 2010-11-25 NOTE — Cardiovascular Report (Signed)
NAME:  IRA, DOUGHER           ACCOUNT NO.:  [id number]   MEDICAL RECORD NO.:  [id number]          PATIENT TYPE:  INP   LOCATION:  4731                         FACILITY:  [facility]   PHYSICIAN:  Nicki Guadalajara, M.D.     DATE OF BIRTH:  [birth year]   DATE OF PROCEDURE:  04/22/2004  DATE OF DISCHARGE:                              CARDIAC CATHETERIZATION   INDICATIONS:  Mr. Yasiel Goyne is a 77 year old gentleman who underwent  directional coronary atherectomy of his subtotal proximal LAD stenosis in  October 1991.  He is status post stenting of his obtuse marginal vessel of  his circumflex in 2002.  In December 2004, he suffered an anterior  myocardial infarction and underwent stenting of his LAD.  He had developed  recurrent chest pain two weeks later and was found to have a patent stent.  The patient had been doing well on medical therapy.  Recently, he has  noticed recurrent episodes of some chest discomfort.  He was seen in the  office yesterday after having experienced four episodes of chest pain  associated with mild diaphoresis.  He was admitted to [facility] with  presumptive diagnosis of unstable angina.  He is now referred for definitive  repeat cardiac catheterization.   DESCRIPTION OF PROCEDURE:  After premedication with Valium 3 mg  intravenously, the patient was prepped and draped in the usual fashion.  His  right femoral artery was punctured anteriorly and a 5 French sheath was  inserted.  Diagnostic catheterization was done with 5 French Judkins 4 left  and right coronary catheters.  A pigtail catheter was used for biplane cine  left ventriculography as well as distal aortography.  Hemostasis was  obtained by direct manual pressure.  The patient tolerated the procedure  well.   HEMODYNAMIC DATA:  Central aortic pressure was 100/51, mean 71.  Left  ventricular pressure 100/9, post A wave 15.   ANGIOGRAPHIC DATA:  Left main coronary artery was a normal vessel  which  bifurcated into the LAD and left circumflex system.   The first diagonal vessel was small caliber.  The second diagonal vessel  seemed to arise just in the region of the proximal portion of the stented  segment in the LAD.  There was mild 40-50% narrowing at the ostium of this  diagonal vessel.  The LAD stent was widely patent.   The remainder of the LAD was free of significant disease.   The circumflex vessel had 30-40% proximal narrowing. The marginal vessel had  a widely patent stent which was not easily seen.  There was no restenosis in  this marginal vessel.  The AV groove circumflex just after the marginal take  off had 20% ostial narrowing.  The circumflex gave rise to a posterior  lateral type vessel.  It was otherwise free of significant disease.   The right coronary artery was angiographically normal and gave rise to a PDA  and a small posterior lateral system.  There was no significant obstruction.   Biplane cine left ventriculography revealed preserved global LV  contractility.  There was small residual area of hypocontractility in the  mid portion of the LAD on the RAO projection.  The contractility in the LAO  projection appeared normal.   Distal aortography did not demonstrate any significant renal artery stenosis  or aortoiliac disease.   IMPRESSION:  1.  Normal left ventricular function with mild residual miniature lateral      hypocontractility.  2.  No significant restenosis in the left anterior descending stented      segment with 40-50% narrowing in the small second diagonal vessel.  3.  30-40% proximal narrowing in the circumflex vessel without evidence for      restenosis in the previously placed stent in the OM vessel.  There was      evidence for 20% narrowing in the AV groove circumflex just after the OM      take off.  4.  Normal right coronary artery.   RECOMMENDATIONS:  Medical therapy.       TK/MEDQ  D:  04/22/2004  T:  04/22/2004  Job:   [id number]   cc:   Georgina Quint. Plotnikov, M.D. [facility]   Orville Govern

## 2010-11-25 NOTE — Discharge Summary (Signed)
NAME:  [name], [name]                     ACCOUNT NO.:  [id number]   MEDICAL RECORD NO.:  [id number]                   PATIENT TYPE:  INP   LOCATION:  3713                                 FACILITY:  [facility]   PHYSICIAN:  Madaline Savage, M.D.             DATE OF BIRTH:  05-23-1930   DATE OF ADMISSION:  07/09/2003  DATE OF DISCHARGE:  07/11/2003                                 DISCHARGE SUMMARY   DISCHARGE DIAGNOSES:  1. Chest pain, negative myocardial infarction, stable stents.  2. Syncope most likely secondary to nitroglycerin sublingual use.  3. Paroxysmal atrial fibrillation resolved.  4. Wide complex tachycardia unfortunately not documented but seen by     physician.  5. History of bladder cancer.  6. Recent acute anterior myocardial infarction with percutaneous     transluminal coronary angioplasty and stent to the proximal left anterior     descending June 21, 2003.  7. Cough with gagging with thick mucous.  8. Leukopenia.  9. Thrombocytopenia.   DISCHARGE CONDITION:  Improved.   PROCEDURE:  Emergent heart catheterization July 09, 2003 by Thereasa Solo.  Little, M.D. with stable stents.   PENDING TESTS:  A 2-D echo was done and not read at discharge.   MEDICATIONS AT DISCHARGE:  1. Altace 2.5 mg twice a day.  2. Zocor 40 mg one every evening.  3. Plavix 75 mg one daily.  4. Enteric-coated aspirin 81 mg daily.  5. Zetia 10 mg one daily.  6. Protonix 40 mg daily.  7. Coreg 9.375 mg twice a day, that is 1-1/2 of a 6.25 mg tab twice a day.  8. Flomax 0.4 mg daily at bedtime.  9. Nitroglycerin sublingual p.r.n. chest pain.  10.      Mucinex for guaifenesin 600 mg one or two twice a day.   DISCHARGE INSTRUCTIONS:  1. No strenuous activity for two days then resume regular activity.  2. Low fat, low salt diet.  3. Wash right groin cath site.  Call if any bleeding, swelling, or drainage.  4. Have blood work done on Tuesday, July 14, 2003.  5. Follow up with  Nicki Guadalajara, M.D. July 19, 2003 as before.   HISTORY OF PRESENT ILLNESS:  A 77 year old white married male patient of Dr.  Landry Dyke was discharged June 25, 2003 after having CYPHER stent to the  mid LAD by Darlin Priestly, M.D. for 100% LAD occlusion with an acute  anterior MI.   The patient continued with episodic chest pain, a dull pressure pain,  periodically after the procedure.  He did call and have an appointment with  Nicki Guadalajara, M.D. for January 9th.   On July 09, 2003, he was waiting for his wife to have lab work done at  [facility] office and was sitting in the car, had chest pain, tried the  nitroglycerin spray.  After two sprays he did not feel well and his wife  came down  and found him passed out in the car.  Two physicians came out.  He  woke up but his thinking was somewhat muddled afterwards.  His wife drove  him to [facility] for further evaluation.  In the emergency room, pain  was improved but was still somewhat short of breath.  T wave was flipped in  I and aVL, V2 through V6 and a Q in V1 through V4.  Troponin-I was 0.11.  The patient was felt to be in acute coronary syndrome and wondered if in LAD  stents a thrombosis had occurred.  The patient was taken emergently to the  cath lab by Gaspar Garbe B. Little, M.D., underwent cardiac catheterization  through the right femoral artery.  LAD long stent, CYPHER stent and proximal  LAD there was some irregularities within the stent but there was TIMI III  flow.  First diagonal was stable.  Left circumflex history of stents in that  that were patent and the RCA was patent.  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  EF was 45%.  No thrombus.  EDP was 16.   It was noted that when the patient came to the cath lab, he was in atrial  flutter.  He converted on his own to sinus rhythm.  And in the emergency  room, the patient had some wide complex tachycardia.  Unfortunately, that  was not  documented on rhythm strips but he was given 100 mg IV lidocaine in  the emergency room.   PROCEDURES:  Cardiac cath as described by Thereasa Solo. Little, M.D. on July 09, 2003.   DISCHARGE MEDICATIONS:  1. Altace 2.5 mg one twice a day.  2. Zocor 40 mg every evening.  3. Plavix 75 mg daily.  4. Enteric-coated aspirin 81 mg daily.  5. Zetia 10 mg daily.  6. Protonix 40 mg daily.  He was not taking that previously and he was also     taking a 325 mg of aspirin previously.  7. Coreg 9.375 mg one twice a day.  Previously, the patient had been on 6.25     mg twice a day but he had run out and was not taking it.   PHYSICAL EXAMINATION AT DISCHARGE:  VITAL SIGNS:  Blood pressure 110/56,  pulse 62, respirations 21, temperature 98.3.  Oxygen saturation on room air  50%.  GENERAL:  Alert and oriented white male in no acute distress.  SKIN:  Warm and dry.  LUNGS:  Clear.  ABDOMEN:  Positive bowel sounds.  EXTREMITIES:  Without edema.  CHEST:  The chest wall with discomfort to palpation to the site of his pain.   On the day of discharge, the patient did have chest pain seen and evaluated  by Nicki Guadalajara, M.D.   Family History, Social History, Review of Systems see H&P.   LABORATORY DATA:  Admitting labs:  Hemoglobin 12.1, hematocrit 34.4, WBC  4.2, MCV 91.6, platelets 143.  Pro time 1.3, INR 1, PTT 29.  On the day of  discharge his WBC was 3.6 and his platelets were 134.   Chemistries:  Sodium 139, potassium 3.4, chloride 105, CO2 26, glucose 115,  BUN 23, creatinine 1.2, calcium 8.7 and at discharge sodium 143, potassium  4.2, BUN 13, creatinine 1.2, glucose 115.   Cardiac enzymes were negative with MBs 1.5-1.6, troponin-I 0.03, negative  for MI.   TSH was 5.146.   A portable chest x-ray in the emergency room no active disease,  chest/lungs  were clear.  Initial EKG:  Sinus bradycardia, right axis deviation.  Cannot rule out  anterior septal infarction, T-wave abnormality,  consider lateral ischemia.  Followup on the 31st sinus rhythm, cannot rule out anterior septal  infarction, age-undetermined.  Abnormal EKG.   And on July 11, 2003 EKG was without any significant change during chest  pain.   HOSPITAL COURSE:  [name] was admitted to [facility] July 09, 2003 after experiencing chest pain while waiting for his wife.  After two  nitroglycerin he did have a syncopal episode possibly related to  nitroglycerin sublingually for his chest pain, a vasovagal type syncope.  His wife drove him on to [facility] and on EKG had questionable acute  anterior changes and the patient went to the cardiac catheterization lab  since he did recently have an acute anterior MI and LAD stenting.  Cath  showed stable stents and the patient was admitted to ICU for further  observation.   He did have two episodes of arrhythmia, one in atrial flutter prior to his  cardiac cath.  His Coreg was increased for that episode and no further  episodes were done prior to discharge.   The patient also had wide complex tachycardia that was not documented but he  was given a 100 mg bolus of lidocaine.  The patient was not on his Coreg at  the time of admission.  He had run out of medicines on two days previous.   He also had a potassium of 3.4.   The patient was transferred to the telemetry unit on the 31st of December.  There he did well, was ambulating without problems until the morning of  July 11, 2003.  At that point, he developed some chest pain that would  come and go, more of a heavy pressure similar to what he had been having at  home.  Nicki Guadalajara, M.D. was concerned that most likely it was related to  reflux.  He had been having some difficulty swallowing and may need a GI  consult in the near future.   Prior to discharge, the patient also related he gets a cough that gags him  and he gets up some really thick mucus, unsure if it is upper respiratory,   sinus drainage causing the problem.  Mucinex was added to his medical regime  to see if that would assist with thinning of the mucus.  He will follow up  with Nicki Guadalajara, M.D. as previously instructed.  He is to see Molli Hazard B.  Daphine Deutscher, M.D. on January 5th for a liver issue that I do not have a record  of.  The  patient states he had something on his liver.  We will need to get those  records when he comes in for our office visit on January 9th.   The patient's 2-D echo had been done and was pending at discharge.      [name]. [place], N.P.                     Madaline Savage, M.D.    LRI/MEDQ  D:  07/11/2003  T:  07/11/2003  Job:  [id number]   cc:   Georgina Quint. Plotnikov, M.D. [facility]   [place]. Daphine Deutscher, M.D.  [address]  [place]  [place] [address]  Fax: [phone]

## 2010-11-25 NOTE — Discharge Summary (Signed)
[name], [name]           ACCOUNT NO.:  [id number]   MEDICAL RECORD NO.:  [id number]          PATIENT TYPE:  INP   LOCATION:  4731                         FACILITY:  [facility]   PHYSICIAN:  Nicki Guadalajara, M.D.     DATE OF BIRTH:  [birth year]   DATE OF ADMISSION:  04/21/2004  DATE OF DISCHARGE:  04/22/2004                                 DISCHARGE SUMMARY   ADMISSION DIAGNOSES:  1.  Chest pain.  2.  Coronary artery disease, status post myocardial infarction in 1991, 2002      and 2004 with multiple procedures.  3.  Hypertension.  4.  Hyperlipidemia.  5.  History of transient paroxysmal atrial fibrillation after his first      myocardial infarction.  6.  History of bladder cancer.  7.  Anxiety.   DISCHARGE DIAGNOSES:  1.  Chest pain.  2.  Coronary artery disease, status post myocardial infarction in 1991, 2002      and 2004 with multiple procedures.  3.  Hypertension.  4.  Hyperlipidemia.  5.  History of transient paroxysmal atrial fibrillation after his first      myocardial infarction.  6.  History of bladder cancer.  7.  Anxiety.   PROCEDURES:  Cardiac catheterization on April 22, 2004.   BRIEF HISTORY:  The patient is a 77 year old white male with a history of a  myocardial infarction in 1991.  He had atherectomy of his LAD at that time.  In December of 2002, the patient had PTCA and stent to his OM.  On June 21, 2003, the patient returned with chest pain and had 100% LAD.  The  patient had a stent placed in his LAD.  He returned with chest pain on  July 09, 2003.  He had a recatheterization by Dr. Clarene Duke which showed  the LAD Cypher stent was patent, the circumflex and OM were patent and the  RCA had minimal disease.  His last visit was on November 07, 2003.  The patient  was having some chest pain and was started on Imdur at that time.  Followup  today shows that the patient is having chest pain every other day with  activity or stress.  The pain is two to  three minutes in duration.  He does  have some shortness of breath and some occasional diaphoresis with it.  Today the patient had four episodes of chest pain.  The last occurred while  discussing the procedure with Dr. Tresa Endo.  His EKG shows a sinus rhythm.  T  waves were down in aVL and V1-3.  There were no ST changes.  Changes.  Changes are similar to prior EKGs.  The pain was relieved with one  nitroglycerin.  The patient was readmitted to undergo cardiac  catheterization the next day.   PAST MEDICAL HISTORY:  1.  Coronary artery disease as above.  2.  Paroxysmal atrial fibrillation and history of wide complex tachycardia      which is documented.  3.  History of bladder cancer.  4.  Appendectomy.  5.  History of liver disease with negative biopsy in  January of 2004.  6.  Hypertension.   ALLERGIES:  None.   HABITS:  ETOH:  None.  Cigarettes:  None.  Drugs:  None.   MEDICATIONS ON ADMISSION:  1.  Zocor 40 mg daily.  2.  Altace 2.5 mg b.i.d.  3.  Flomax 0.4 mg daily.  4.  Ecotrin 81 mg daily.  5.  Nitroglycerin __________ b.i.d.  6.  Plavix 75 mg daily.  7.  Vytorin 10/40 mg one daily.   SOCIAL HISTORY:  He is married, retired and lives with his wife.   FAMILY HISTORY:  Positive for CAD.   For further history and physical, please see the dictated note.   HOSPITAL COURSE:  The patient was admitted and stabilized overnight.  He  underwent cardiac catheterization the next day which showed the right  coronary artery to be normal.  There was a 40% stenosis in the stented  portion of the LAD proximally.  There was a 30%, 40% and 20% stenosis.  EF  was normal.  It was Dr. Landry Dyke opinion that the patient could continue on  medical management.   DISCHARGE MEDICATIONS:  1.  Zocor 40 mg daily.  2.  Altace 2.5 mg q.12h.  3.  __________ 0.4 mg h.s.  4.  Aspirin 81 mg daily.  5.  Coreg 12.5 mg b.i.d.  6.  Vytorin 10/80 mg one daily.  7.  Imdur 30 mg daily.  8.  Plavix 75 mg  daily.  9.  Protonix 40 mg daily.  10. Nitroglycerin.  Place one under the tongue every five minutes as needed      p.r.n.  Call if he needed three or more.   DISCHARGE ACTIVITY:  Light to moderate.  No lifting or driving for 72 hours.   DISCHARGE DIET:  Low fat.   WOUND CARE:  He was instructed to call if he has any problems with his wound  site.   FOLLOWUP:  He is to follow up with Dr. Posey Rea at his convenience.  Dr.  Landry Dyke office will schedule him to return in approximately three weeks.       WDJ/MEDQ  D:  06/01/2004  T:  06/01/2004  Job:  [id number]   cc:   Georgina Quint. Plotnikov, M.D. [facility]

## 2010-11-25 NOTE — Discharge Summary (Signed)
NAMESPIKE, DESILETS           ACCOUNT NO.:  [id number]   MEDICAL RECORD NO.:  [id number]          PATIENT TYPE:  INP   LOCATION:  2039                         FACILITY:  [facility]   PHYSICIAN:  Nicki Guadalajara, M.D.     DATE OF BIRTH:  01-06-1930   DATE OF ADMISSION:  12/03/2005  DATE OF DISCHARGE:  12/08/2005                                 DISCHARGE SUMMARY   DISCHARGE DIAGNOSES:  1.  Chest pain, noncardiac with patent stents this admission.  2.  Status post laparoscopic cholecystectomy Dec 07, 2005.  3.  Paroxysmal atrial fibrillation this admission, now on Coumadin and      Plavix.  4.  Known coronary disease with previous left anterior descending artery      Cypher stenting in 2004.  5.  Treated hyperlipidemia.  6.  Past history of bladder cancer.   HOSPITAL COURSE:  Mr. Kunzler is a 77 year old male followed by Dr.  Posey Rea and Dr. Tresa Endo.  He presented to [facility] ER with chest pain.  He  has had a remote MI in 1991 and had the LAD atherectomy.  In 2002, he had a  stent placed, and in 2004, he had an LAD Cypher stent placed.  He has good  LV function.  He presented with epigastric and left arm pain.  He also had  some associated nausea and vomiting.  He was admitted to telemetry, started  on IV heparin.  CK-MB and troponins were obtained.  His troponins were  negative.  He was set up for diagnostic catheterization which was done Dec 05, 2005 by Dr. Alanda Amass.  He has a 30% smooth mid RCA stenosis, previously  placed circumflex OM-2 stent was patent, he did have 70 to 80% first  diagonal narrowing.  The LAD Cypher stent was patent.  EF was 50%.  He had  normal renal arteries.  Further evaluation was done to evaluate noncardiac  chest pain.  Abdominal ultrasound was obtained and revealed stones.  He was  seen in consult by the GI service and the surgical service.  CT of the chest  showed no pulmonary embolism.  There was a cystic lesion at the head of the  pancreas which  was unchanged and may represent pancreatic pseudocyst.  The  patient's LFTs were essentially normal with a SGOT of 14 and SGPT of 8.  He  underwent elective laparoscopic cholecystectomy on Dec 07, 2005.  He  tolerated this well.  During this hospitalization, he has had some atrial  fibrillation on the 29th.  He is in sinus rhythm now.  When he was in atrial  fibrillation, his rate was controlled at 75, although he did have some  faster rates on the 28th.  The patient was seen by the surgeons on June 1,  and they feel he can be discharged.  After discussion with Dr. Jacinto Halim, it has  been decided to stop his aspirin, continue his Plavix and Coumadin.  He will  have an INR next week on Tuesday.  His INR goal will be 2-3.   DISCHARGE MEDICATIONS:  1.  Coumadin 5 mg a day  or as directed.  2.  Benazepril 10 mg a day.  3.  Coreg 25 mg twice a day.  4.  Vytorin 10/80 daily.  5.  Prilosec OTC once a day.  6.  Plavix 75 mg a day.   He will stop his aspirin.   LABORATORY DATA:  White count 5.5, hemoglobin 11.3, hematocrit 32, platelets  108.  Sodium of 138, potassium 3.8, BUN 12, creatinine 1.1.  Liver functions  were normal.  Troponins were negative.  TSH 2.1. INR at discharge 1.10.   DISPOSITION:  The patient discharged in stable condition.  He will follow up  Dr. Orson Slick in a couple weeks.  He will contact the surgical service if he  has any problems with nausea or vomiting over the weekend.  Otherwise, Dr.  Tresa Endo will see him in a couple weeks.      Abelino Derrick, P.A.    ______________________________  Nicki Guadalajara, M.D.    Lenard Lance  D:  12/08/2005  T:  12/08/2005  Job:  [id number]   cc:   Lebron Conners, M.D.  [address], [address]  [place]  [place] [address]   Georgina Quint. Plotnikov, M.D. [facility]  [address]  [place]  [place] [address]

## 2010-11-25 NOTE — Consult Note (Signed)
[name], TIFFANY           ACCOUNT NO.:  [id number]   MEDICAL RECORD NO.:  [id number]          PATIENT TYPE:  INP   LOCATION:  2039                         FACILITY:  [facility]   PHYSICIAN:  [name], M.D.   DATE OF BIRTH:  [birth year]   DATE OF CONSULTATION:  12/06/2005  DATE OF DISCHARGE:                                   CONSULTATION   CONSULTING SURGEON:  Dr. Orson Slick   PRIMARY CARE PHYSICIAN:  Dr. Posey Rea   CARDIOLOGIST:  Dr. Tresa Endo   REASON FOR CONSULTATION:  Abdominal pain and ultrasound and CT consistent  with cholecystitis.   HISTORY OF PRESENT ILLNESS:  Mr. Tiso is a 77 year old male patient  with known CAD and prior MI and stents.  He was admitted on Dec 03, 2005  with complaints of chest pain.  Subsequent cardiac work-up including enzymes  and catheterization were negative.  In discussing with the patient he has  had at least one year of epigastric and more recently right upper quadrant  abdominal pain.  His symptoms were usually increased with stress and thereby  he was initially treated as cardiac etiology for this discomfort.  He has  had no postprandial symptoms.  He has occasionally been awakened during the  night with right upper quadrant abdominal pain.  An ultrasound has been done  this admission with a normal-appearing gallbladder, but positive stones, no  ductal dilatation, and a positive Murphy's sign.  CT did demonstrate some  gallbladder wall thickening and some mild pericholecystic fluid and  inflammation.  His amylase and white count have been normal.  LFTs have not  been checked.  Because of these findings a surgical consultation has been  requested.   REVIEW OF SYSTEMS:  As per the history of present illness.  Patient has had  low-grade fever since admission with a Tmax of 100.6.  He has had increasing  right upper quadrant and right middle abdominal pain, especially with touch.  He has had no true cardiac symptoms such as angina  relieved by  nitroglycerin, shortness of breath, orthopnea, or tachy palpitations.  He  has had no lower extremity swelling.  He has had no hematuria and no dark or  bloody stools.   PAST MEDICAL HISTORY:  1.  MIs with stents in both 1991 and 2004.  2.  Normal LV function, EF 50% per catheterization this admission.  3.  Apical aneurysm with anterior apical akinesis found on catheterization      this admission.  4.  Dyslipidemia.  5.  Prior bladder cancer.  6.  Paroxysmal atrial fibrillation.  7.  Hypertension.   PAST SURGICAL HISTORY:  1.  Appendectomy.  2.  Bladder cancer resection in 1994.  3.  Liver biopsy in 2004 due to white spot seen on the liver.   SURGICAL HISTORY:  Former tobacco use, quit in the 1990s.  No alcohol.  He  is married.   FAMILY HISTORY:  Noncontributory.   ALLERGIES:  NKDA.   CURRENT MEDICATIONS:  Protonix, aspirin, Coreg, Lotensin, Vytorin, Colace,  Coumadin which is on hold, IV heparin, Aggrastat post catheterization,  nitroglycerin sublingual, Xanax,  Zofran, and Darvocet.  Patient's last dose  of Plavix was on Dec 04, 2005.   PHYSICAL EXAMINATION:  GENERAL:  Pleasant male patient currently complaining  of right upper quadrant abdominal pain.  VITAL SIGNS:  Temperature 99.1, blood pressure 102/55, pulse 62 and regular,  respirations 20.  NEUROLOGIC:  Alert and oriented x3.  Moving all extremities x4.  No focal  deficits.  HEENT:  Head is normocephalic.  Sclerae not injected.  NECK:  Supple.  No adenopathy.  CHEST:  Bilateral lung sounds are clear to auscultation.  Respiratory effort  is non-labored.  He is saturating 95-96% on room air.  CARDIAC:  S1, S2.  Maintaining sinus rhythm.  No rubs, murmurs, thrills, nor  gallops.  He is on IV heparin.  ABDOMEN:  Soft.  Bowel sounds are present.  He has a Murphy's sign on  palpation over the right upper quadrant and the gallbladder is palpable.  EXTREMITIES:  Symmetrical in appearance without edema,  cyanosis, or  clubbing.  Pulses are palpable.  The right groin is stable post  catheterization with trace ecchymosis.   LABORATORIES:  No LFTs have been done this admission.  Amylase is 30.  INR  1.1 on May 30.  White count 6100, hemoglobin 12.1, platelets 99,000.  Sodium  139, potassium 4.3, BUN 14, creatinine 1.2.   DIAGNOSTICS:  An ultrasound of the abdomen was done.  Shows 6 mm gallbladder  wall, normal per radiologist, stone from sludge with sonographic Murphy's  sign and no ductal dilatation.  CT of the chest shows gallbladder wall  appeared to be thickened, stones and subtle pericholecystic inflammation.   IMPRESSION:  1.  Right upper quadrant abdominal pain and radiographic findings consistent      with acute as well as chronic cholecystitis and cholelithiasis.  2.  Known coronary artery disease with negative recent catheterization.  3.  Normal left ventricular function.  4.  Dyslipidemia.  5.  Paroxysmal atrial fibrillation on systemic anticoagulation.  Coumadin on      hold.   PLAN:  Patient will need to have a cholecystectomy later this week.  Patient's last dose of Plavix, again, was on Dec 04, 2005 and he is also on  aspirin.  Will go ahead and hold the aspirin and reevaluate patient on  Friday checking a bleeding time early Friday morning.  The INR is 1.1.  He  has been on Coumadin, but is on heparin secondary to PAF.  Continue to  monitor.  He does not have any leukocytosis but he has presented with low-  grade fever and based  on clinical as well as radiographic findings feel patient may have a degree  of cholecystitis.  Will go ahead and start Ancef IV.  Patient is expected to  transfer out of the ICU to the floor telemetry unit later today.  Additional  recommendations per Dr. Orson Slick after he interviews and examines the patient  and review radiographic studies.      Allison L. Rennis Harding, N.P.      [name], M.D. Electronically Signed    ALE/MEDQ  D:   12/06/2005  T:  12/06/2005  Job:  [id number]   cc:   Georgina Quint. Plotnikov, M.D. [facility]  [address]  [place]  [place] [address]

## 2010-11-25 NOTE — Discharge Summary (Signed)
NAME:  [name], [name]                     ACCOUNT NO.:  [id number]   MEDICAL RECORD NO.:  [id number]                   PATIENT TYPE:  INP   LOCATION:  2003                                 FACILITY:  [facility]   PHYSICIAN:  Madaline Savage, M.D.             DATE OF BIRTH:  [birth year]   DATE OF ADMISSION:  06/20/2003  DATE OF DISCHARGE:  06/25/2003                                 DISCHARGE SUMMARY   ADMISSION DIAGNOSIS:  Unstable angina.   DISCHARGE DIAGNOSES:  1. Acute anterior wall myocardial infarction.  Status post percutaneous     transluminal coronary angioplasty and stent in 2004.  2. History of bladder cancer.  3. Atrial fibrillation, resolved.   PROCEDURES:  Cardiac catheterization on June 21, 2003, with PTCA and  Cypher 3 x 33 mm stent to the LAD, June 21, 2003, Dr. Jenne Campus.   HISTORY:  The patient is a 77 year old white male who developed chest pain  the day prior to admission when walking around [facility].  The pain  resolved with rest, but reoccurred later.  He essentially tried to ignore it  all day.  It became progressive in the evening, and he eventually had his  wife and EMS take him to the emergency room at [facility].  He was  seen by Dr. Chanda Busing.  It was noted that two sublingual nitroglycerin  did not resolve his pain.  He was placed on IV nitroglycerin in the  emergency room, and by the time we saw him his pain was down to a 2/10.  The  patient is normally followed by Dr. Daphene Jaeger, and was actually seen the  Tuesday before with no pain at that time.  Previous pain has required a  single nitroglycerin to resolve the pain.   PAST MEDICAL HISTORY:  Coronary artery disease with prior stent placement in  February 2004.  Previous PTCA of different arteries.  That information is  not on the chart.   PAST SURGICAL HISTORY:  1. Appendectomy.  2. Bladder cancer surgery in 1994.  3. Liver biopsy in January 2004, with no neoplasms  found.   ALLERGIES:  No known drug allergies.   MEDICATIONS:  1. ISMO 30 mg q.d.  2. Tensilon 100 mg t.i.d. p.r.n. for cough.  3. Flomax 0.4 mg q.d.  4. Zocor 40 mg q.d.  5. Lopressor SR 100 mg q.d.  6. Vicodin 5/500 p.r.n.  7. Doxycycline 100 mg b.i.d.   For further history and physical, please see the dictated note.   HOSPITAL COURSE:  The patient was admitted.  He was initially comfortable on  nitroglycerin.  CKs were found to be positive.  The first one was 646, and  went as high as 1433, with a CK-MB initially being 105.4.  The peak was  155.9.  His initial troponin was 4.8, this also peaked at 54.12.  On the  second hospital day, he was initially pain-free, but  developed chest pain  which could be controlled.  At that point, it was Dr. Truett Perna opinion that  he had an acute MI with unstable angina, and he was subsequently taken to  the cath lab by Dr. Jenne Campus on June 21, 2003.  This showed a 30%  proximal stenosis of the RCA.  Previous stent in the OM was normal.  The  patient had 100% stenosis after the first diagonal followed by a 60%  stenosis before his second diagonal.  At this point, the patient underwent  PTA with a Maverick 2.5 x 15 balloon.  He then had a Cypher stent placed  which was a 3 x 33.  The patient tolerated the procedure well, and returned  to the recovery area back to the ICU in satisfactory condition.  He  developed atrial fibrillation the following evening.  He was placed on IV  Cardizem, he was already on IV heparin.  He was medically stable and his  medicines were being advanced.  He made slowly steady progress.  His atrial  fibrillation converted spontaneously while on Cardizem.  He was mobilized  and transferred to the floor, and by June 25, 2003, he was doing well.  It was Dr. Mikey Bussing opinion at that point that he could be discharged home.  His labs at that point showed a hemoglobin of 11.8, hematocrit of 33,  platelet count was 119,000,  white count was 5.3.  BMET shows a sodium of  140, a potassium of 3.8, a chloride of 103, CO2 of 30, BUN of 17, and  creatinine of 1.2, a glucose of 108, his maximum glucose appears to be 196.  This has slowly declined to its level throughout his hospital course.  LFTs  were normal.  CKs and troponins as noted above.   DISCHARGE MEDICATIONS:  1. Altace 1.25 mg q.12h.  2. Aspirin 325 mg q.d.  3. Coreg 6.25 mg q.12h.  4. Plavix 75 mg q.d.  5. Protonix 40 mg q.d.  6. Zetia 10 mg q.d.  7. Zocor 40 mg q.d.  8. Tylenol p.r.n. pain.   LV function based on the cardiac catheterization was 40 to 45% with  anterior, lateral, and apical hypokinesis.   PLAN:  Have the patient return in two weeks to see Dr. Tresa Endo, with followup  sooner if needed.  Office will make that appointment.   CONDITION ON DISCHARGE:  Improved.      Eber Hong, P.A.                 Madaline Savage, M.D.    WDJ/MEDQ  D:  06/25/2003  T:  06/26/2003  Job:  [id number]   cc:   Nicki Guadalajara, M.D.  [phone]  [place], [place] [address]  Fax: [phone]   Sean A. Everardo All, M.D. [facility]

## 2010-11-25 NOTE — Cardiovascular Report (Signed)
NAME:  [name], [name]           ACCOUNT NO.:  [id number]   MEDICAL RECORD NO.:  [id number]          PATIENT TYPE:  INP   LOCATION:  2915                         FACILITY:  [facility]   PHYSICIAN:  Richard A. Alanda Amass, M.D.DATE OF BIRTH:  Dec 12, 1929   DATE OF PROCEDURE:  12/05/2005  DATE OF DISCHARGE:                              CARDIAC CATHETERIZATION   PROCEDURE:  Retrograde central aortic catheterization; selective coronary  angiography via Judkins technique; LV angiogram RAO, LAO projection;  abdominal angiogram midstream PA projection hand injection; CRFA successful  closure with 6-French Angio-Seal closure device.   PROCEDURE:  The patient is brought to the second floor CP lab in a  postabsorptive state after 5 mg Valium p.o. premedication.  Heparin was on  hold.  IV nitroglycerin was at 2 microdrops a minute.  Informed consent was  obtained from the patient and his family to proceed with diagnostic  angiography.  Preoperative laboratory showed normal renal function, CBC and  diff ; and chronic low platelets of 112-120,000 on chronic aspirin and  Plavix.  No change on IV heparin over the weekend.  The right groin is  prepped, draped in the usual manner.  Xylocaine 1% was used for local  anesthesia.  The patient was given 25 mcg of fentanyl and 2 mg of Nubain IV  for sedation.  The CRFA was entered with a single anterior puncture using 18  thin-wall needle and a 6-French short Daig sidearm sheath was inserted  without difficulty.  Diagnostic coronary angiography was done with 6-French  4-cm taper preformed Cordis coronary and pigtail catheters using Omnipaque  dye throughout the procedure.  LV angiogram was done in the RAO and LAO  projection with 25 mL at 14 mL per second and 20 mL at 12 mL per second.  Pullback pressure of the CA was performed and showed no gradient across the  aortic valve.  Abdominal angiogram was done by hand injection above the  level of the renal arteries  demonstrating normal single renal arteries  bilaterally.  There was mild dilatation of the infrarenal abdominal aorta in  a saccular-type appearance.  There was no significant aneurysms or stenosis  and the proximal iliacs appeared normal.  The hypogastrics were intact  bilaterally.  The right CFA profunda junction was normal on iliac hand  injection.  Catheter was removed.  Side-arm sheath was left.  Cine  angiograms were reviewed.  The right femoral puncture was closed with a 6-  French Angio-Seal device successfully.  The patient was transferred to the  holding area for postoperative care in stable condition with intact right  lower extremity pulses.   Pressures:  LV:  110/06; LVEDP 18-22 mmHg.   CA:  110/60 mmHg.  There is no gradient across the aortic valve on catheter  pullback.   LV angiogram demonstrated mild hypokinesis of the mid anterolateral wall and  distal third of the inferior wall.  There was paradoxical apical motion  compatible with a localized aneurysmal dilatation in this area.  The  anteroapical segment in the LAO projection was akinetic.  There was no  thrombus seen.  There was  no mitral regurgitation.  Estimated EF  approximately 50% or greater.   Fluoroscopy showed the previously placed LAD and CXOM stents.  There was +1  left coronary calcification.  No significant valvular or intracardiac  calcification.   The main left coronary was normal.   The LAD had a widely patent Cypher stent from prior PCI December 2004 with  less than 10% narrowing.  There was approximately 40% mild narrowing beyond  the stent at the level of the small DX-2.  The remainder of the LAD was  widely patent and large, coursed the apex of the heart where it bifurcated.   The small DX-1 was thin, relatively small, and probably less than 2.0 vessel  proximally and had 70-80% ostial and proximal narrowing with good flow.   The small DX-2 arose from the junction of proximal third of the  LAD and had  no significant stenosis.  There was a small DX-3 from the mid to distal LAD  that had no significant stenosis.  There was a large septal perforator  before the proximal LAD stent that was widely patent, trifurcated, and then  was a small septal perforator from the mid portion of the stent that was  widely patent.   The circumflex artery had eccentric smooth 30-40% narrowing in the proximal  third before the small OM-1.  The large OM-2 bifurcated twice and had widely  patent stent in the proximal third beyond the ostia with less than 20%  narrowing, which was smooth with good flow.  The ostium was well visualized  and oblique views with no significant stenosis.   The circumflex proper was moderately large and had two large distal marginal  branches and a small PABG branch that were normal.   The right coronary was a codominant vessel.  There was a large RV branch  from the proximal third that was normal.  There was 30% smooth narrowing  segmentally in the mid RCA.  The PLA was moderate size, bifurcated, and  normal, and the PDA was normal.   DISCUSSION:  Mr. Fjeld has a chronic chest pain syndrome which is often  difficult to evaluate.  He has had multiple catheterizations in the past and  he has known coronary disease.  He is a white married father of two with  four grandchildren, retired Journalist, newspaper, and a remote smoker.  He  underwent LAD PCA in 1991, subsequently had restenosis and LAD PTCA in 1992.  He had circumflex stent with an NDES of a large OM-3 in 2002 and then  apparently suffered an AWMI with collaterals December 2004, had a total LAD  which was recanalized and stented by Dr. Tresa Endo with a 3.0/32 Cypher stent.  He has had recatheterization since that time in 2004 and again October 2005  which showed no restenosis.  He has had polypectomy by Dr. Loreta Ave in the past, a history of GERD, anxiety.  He has been on chronic aspirin and Plavix with  asymptomatic  low platelets of 110,000 to 120,000 which are unchanged on this  hospitalization.  He was admitted to the hospital with prolonged chest pain,  went to [facility] emergency room.  It had initially wakened him in the  middle of the night.  Initially he described it as substernal; however, on  further history he has been having fairly persistent substernal pressure  that has been present for at least several days to a week and then  intermittent left parasternal sharp discomfort.  He has  increased anxiety  with his chest pain as demonstrated in the hospital on recurrence, with no  EKG changes and enzymes negative for ischemia in the hospital.  He was not  put on IIb/IIIa because of possible GI etiology and he was kept on heparin  which was stopped for this procedure.  Fortunately, he has no restenosis of  the LAD or circumflex stents.  He has moderate disease of a small, thin DX-  1and no other significant coronary disease.  I would recommend reassurance  and continued medical therapy.  He has had PAF with his AWMI in 2002 and he  has had asymptomatic PAF on a monitor in the hospital and he probably will  need chronic Coumadin therapy with continued medical therapy and follow-up.  We have ordered a CT scan to rule out PE, although this is low on our  clinical suspicion.  Empiric aggressive GI therapy.  If recurrent chest pain  and negative chest CT for PE I would recommend GI follow-up and possible  endoscopy.  Cardiac follow-up with Dr. Tresa Endo, medical follow-up with Dr.  Posey Rea.   CATHETERIZATION DIAGNOSES:  1.  Atherosclerotic heart disease status post left anterior descending      coronary artery percutaneous coronary angioplasty 1991.  2.  Positive restenosis left anterior descending coronary artery with left      anterior descending coronary artery percutaneous transluminal coronary      angioplasty October 11, 1990.  3.  Circumflex obtuse marginal two Express II non-drug-eluting stent,  Dr.      Tresa Endo, June 13, 2003, 3.0/12; no long term re-stenosis.  4.  Anterior wall myocardial infarction with left anterior descending      coronary artery stent June 21, 2003; drug-eluting stent 3.0/32      Cypher, Dr. Tresa Endo; no restenosis 2004, 2005 and current catheterization.  5.  Residual diagonal one small thin vessel ostial and proximal stenosis,      otherwise noncritical coronary artery disease.  6.  Hyperlipidemia.  7.  Anxiety.  8.  Gastroesophageal reflux disease.  9.  Snare polypectomy June, 2003, Dr. Loreta Ave.  10. Remote bladder cancer.  11. Paroxysmal atrial fibrillation with anterior wall myocardial infarction      December 2004, recurrent in the hospital, asymptomatic, short-lived.  12. Chronic thrombocytopenia on Plavix.  Will discontinue at present.      Richard A. Alanda Amass, M.D.  Electronically Signed     RAW/MEDQ  D:  12/05/2005  T:  12/05/2005  Job:  [id number]  cc:   CP Lab   Georgina Quint. Plotnikov, M.D. [facility]  [address]  [place]  [place] [address]   Nicki Guadalajara, M.D.  Fax: [phone]   ONGEXB [facility] XLKG, M.D.  Fax: [phone]

## 2010-12-06 ENCOUNTER — Other Ambulatory Visit: Payer: Self-pay | Admitting: Internal Medicine

## 2010-12-06 ENCOUNTER — Other Ambulatory Visit: Payer: Self-pay

## 2010-12-06 DIAGNOSIS — Z0000 Encounter for general adult medical examination without abnormal findings: Secondary | ICD-10-CM

## 2010-12-06 DIAGNOSIS — Z1289 Encounter for screening for malignant neoplasm of other sites: Secondary | ICD-10-CM

## 2010-12-07 ENCOUNTER — Encounter: Payer: Self-pay | Admitting: Internal Medicine

## 2011-03-30 LAB — CBC
HCT: 38.6 — ABNORMAL LOW
Hemoglobin: 13.7
MCHC: 35.6
MCV: 93.5
Platelets: 120 — ABNORMAL LOW
RBC: 4.13 — ABNORMAL LOW
RDW: 12.9
WBC: 8.9

## 2011-03-30 LAB — CK TOTAL AND CKMB (NOT AT ARMC)
CK, MB: 4.7 — ABNORMAL HIGH
Relative Index: 3 — ABNORMAL HIGH
Total CK: 155

## 2011-03-30 LAB — PROTIME-INR
INR: 1.1
Prothrombin Time: 14.2

## 2011-03-30 LAB — B-NATRIURETIC PEPTIDE (CONVERTED LAB): Pro B Natriuretic peptide (BNP): 259 — ABNORMAL HIGH

## 2011-03-30 LAB — DIFFERENTIAL
Basophils Absolute: 0
Basophils Relative: 0
Eosinophils Absolute: 0
Eosinophils Relative: 0
Lymphocytes Relative: 7 — ABNORMAL LOW
Lymphs Abs: 0.7
Monocytes Absolute: 0.6
Monocytes Relative: 7
Neutro Abs: 7.6
Neutrophils Relative %: 86 — ABNORMAL HIGH

## 2011-03-30 LAB — TROPONIN I: Troponin I: 0.05

## 2011-03-30 LAB — POCT CARDIAC MARKERS
CKMB, poc: 8.8
Myoglobin, poc: >500
Operator id: 4661
Troponin i, poc: <0.05

## 2011-04-13 ENCOUNTER — Encounter: Payer: Self-pay | Admitting: Internal Medicine

## 2011-04-18 ENCOUNTER — Encounter: Payer: Self-pay | Admitting: Internal Medicine

## 2011-04-18 DIAGNOSIS — Z0289 Encounter for other administrative examinations: Secondary | ICD-10-CM

## 2013-11-04 ENCOUNTER — Other Ambulatory Visit: Payer: Self-pay | Admitting: Internal Medicine

## 2013-11-04 NOTE — Telephone Encounter (Signed)
See Rx Sch ov Thx

## 2013-11-04 NOTE — Telephone Encounter (Signed)
Need strenth directions quantity and number of refills for the following: Tamsulosin and Finasteride

## 2013-11-04 NOTE — Addendum Note (Signed)
Addended by: Cassandria Anger on: 11/04/2013 08:56 PM   Modules accepted: Orders

## 2013-11-27 MED ORDER — FINASTERIDE 5 MG PO TABS: 5.0000 mg | ORAL_TABLET | Freq: Every day | ORAL | Status: DC

## 2013-11-27 MED ORDER — TAMSULOSIN HCL 0.4 MG PO CAPS: 0.4000 mg | ORAL_CAPSULE | Freq: Every day | ORAL | Status: DC

## 2013-12-19 ENCOUNTER — Telehealth: Payer: Self-pay | Admitting: Cardiovascular Disease

## 2013-12-19 NOTE — Telephone Encounter (Signed)
Patient called to schedule appointment with Dr. Claiborne Billings.  I told him Dr. Claiborne Billings did not have any openings until 12/29/13..  I asked if this was for a check up or if her was having problems and he stated that his heart was " doing some pounding and he had had some R. Sided chest pain.  I offered to schedule with a app. And the patient refused.  He wanted to wait and see Dr. Claiborne Billings.

## 2013-12-29 ENCOUNTER — Ambulatory Visit (INDEPENDENT_AMBULATORY_CARE_PROVIDER_SITE_OTHER): Payer: Commercial Managed Care - HMO | Admitting: Cardiovascular Disease

## 2013-12-29 ENCOUNTER — Encounter: Payer: Self-pay | Admitting: Cardiovascular Disease

## 2013-12-29 VITALS — BP 128/70 | HR 79 | Ht 70.0 in | Wt 187.8 lb

## 2013-12-29 DIAGNOSIS — I4891 Unspecified atrial fibrillation: Secondary | ICD-10-CM | POA: Insufficient documentation

## 2013-12-29 DIAGNOSIS — R413 Other amnesia: Secondary | ICD-10-CM

## 2013-12-29 DIAGNOSIS — E785 Hyperlipidemia, unspecified: Secondary | ICD-10-CM

## 2013-12-29 DIAGNOSIS — R079 Chest pain, unspecified: Secondary | ICD-10-CM | POA: Insufficient documentation

## 2013-12-29 DIAGNOSIS — I2510 Atherosclerotic heart disease of native coronary artery without angina pectoris: Secondary | ICD-10-CM

## 2013-12-29 MED ORDER — METOPROLOL SUCCINATE ER 25 MG PO TB24: 25.0000 mg | ORAL_TABLET | Freq: Every day | ORAL | Status: DC

## 2013-12-29 MED ORDER — RIVAROXABAN 15 MG PO TABS: 15.0000 mg | ORAL_TABLET | Freq: Every day | ORAL | Status: DC

## 2013-12-29 NOTE — Patient Instructions (Addendum)
Your physician has requested that you have an echocardiogram. Echocardiography is a painless test that uses sound waves to create images of your heart. It provides your doctor with information about the size and shape of your heart and how well your heart's chambers and valves are working. This procedure takes approximately one hour. There are no restrictions for this procedure.  Your physician recommends that you return for lab work fasting.  Your physician has recommended you make the following change in your medication: start new prescriptions for xarelto 15 mg once tablet daily. Metoprolol succ er 25 mg. This has been sent to your pharmacy.  Your physician has requested that you have a lexiscan myoview. For further information please visit [email or web address]. Please follow instruction sheet, as given.  Your physician recommends that you schedule a follow-up appointment in: 4-6 weeks  A chest x-ray takes a picture of the organs and structures inside the chest, including the heart, lungs, and blood vessels. This test can show several things, including, whether the heart is enlarges; whether fluid is building up in the lungs; and whether pacemaker / defibrillator leads are still in place.

## 2013-12-29 NOTE — Progress Notes (Signed)
Patient ID: [name], male   DOB: Mar 20, 1930, 80 y.o.   MRN: [id number]     PATIENT PROFILE: [name] is a 80 y.o. male to the office to reestablish cardiology care.  I have not seen him in over 5 years.  No medical records are available since these are in storage at [facility].   HPI:  [name] is a 80 y.o. male who has a history of coronary artery disease.  The patient believes that approximately 13 years ago.  He may have had his first stent placed.  His last stent was placed and approximately 10 years ago and he believes he has 3 stents in his heart, 2 and 1 vessel, and one in another vessel.  He cannot recall the last stress test that he had but he believes it is well over 5 years ago.  Apparently, since he has been seen he has developed bladder cancer and had been followed by Dr. Rosana Hoes and then [facility].  He relates a history suggestive of possible mild urethral stricture with some mild urinary retention.  Over the past month.  He has noticed episodes of chest pressure, which appear more right-sided and are not always precipitated by activity.  He also has noticed some shortness of breath with activity.  He is unaware of any cardiac arrhythmia.  He denies PND or orthopnea.  He denies tachycardia palpitations.  At times.  He has noticed some mild lightheadedness.  He presents for cardiology evaluation.  He is no longer taking any of his cardiac medications from the past and apparently is just taking aspirin 81 mg in addition to vitamins.  Past Medical History  Diagnosis Date  . Bladder cancer   . Hx of colonic polyp   . CAD (coronary artery disease)   . Hyperlipidemia   . Memory loss 2011  . [name] deficiency 2011    Past Surgical History  Procedure Laterality Date  . Cholecystectomy      No Known Allergies  Current Outpatient Prescriptions  Medication Sig Dispense Refill  . Ascorbic Acid (VITAMIN C PO) Take 1 tablet by mouth daily.      Marland Kitchen  aspirin 81 MG tablet Take 81 mg by mouth daily.        . Cholecalciferol 1000 UNITS tablet Take 1,000 Units by mouth daily.        . vitamin E 400 UNIT capsule Take 400 Units by mouth daily.      . metoprolol succinate (TOPROL XL) 25 MG 24 hr tablet Take 1 tablet (25 mg total) by mouth daily.  30 tablet  6  . Rivaroxaban (XARELTO) 15 MG TABS tablet Take 1 tablet (15 mg total) by mouth daily.  140 tablet  0   No current facility-administered medications for this visit.    Socially, he is married.  He completed eighth grade education.  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  There is no tobacco use.  He does not drink alcohol.  He remains active.  Family History  Problem Relation Age of Onset  . Coronary artery disease Other   . Dementia Other     ROS General: Negative; No fevers, chills, or night sweats HEENT: Negative; No changes in vision or hearing, sinus congestion, difficulty swallowing Pulmonary: Negative; No cough, wheezing, shortness of breath, hemoptysis Cardiovascular:  See HPI;  GI: Negative; No nausea, vomiting, diarrhea, or abdominal pain GU: Positive  for bladder CA and he was told by Dr. Gaynelle Arabian of having mild urinary retention and possible stricture.; No dysuria, hematuria, or difficulty voiding Musculoskeletal: Negative; no myalgias, joint pain, or weakness Hematologic/Oncologic: Negative; no easy bruising, bleeding Endocrine: Negative; no heat/cold intolerance; no diabetes Neuro: Positive for dementia; no changes in balance, headaches Skin: Negative; No rashes or skin lesions Psychiatric: Negative; No behavioral problems, depression Sleep: Negative; No daytime sleepiness, hypersomnolence, bruxism, restless legs, hypnogagnic hallucinations Other comprehensive 14 point system review is negative   Physical Exam BP 128/70  Pulse 79  Ht $R'5\' 10"'Qc$  (1.778 m)  Wt 187 lb 12.8 oz (85.186 kg)  BMI 26.95  kg/m2 General: Alert, oriented, no distress.  Skin: normal turgor, no rashes, warm and dry HEENT: Normocephalic, atraumatic. Pupils equal round and reactive to light; sclera anicteric; extraocular muscles intact; Fundi no hemorrhages or exudates.  Discs flat Nose without nasal septal hypertrophy Mouth/Parynx benign; Mallinpatti scale 3 Neck: No JVD, no carotid bruits; normal carotid upstroke Lungs: clear to ausculatation and percussion; no wheezing or rales Chest wall: without tenderness to palpitation Heart: PMI not displaced, regular, regular rhythm in the mid 70s, compatible with atrial fibrillation, s1 s2 normal, 1/6 systolic murmur, no diastolic murmur, no rubs, gallops, thrills, or heaves Abdomen: soft, nontender; no hepatosplenomehaly, BS+; abdominal aorta nontender and not dilated by palpation. Back: no CVA tenderness Pulses 2+ Musculoskeletal: full range of motion, normal strength, no joint deformities Extremities: no clubbing cyanosis or edema, Homan's sign negative  Neurologic: grossly nonfocal; Cranial nerves grossly wnl Psychologic: Normal mood and affect   ECG (independently read by me): Atrial fibrillation at 79 beats per minute.  Nonspecific ST changes.  QTc interval 411 milliseconds.  LABS:  BMET    Component Value Date/Time   NA 140 01/28/2010 1057   K 3.8 01/28/2010 1057   CL 107 01/28/2010 1057   CO2 29 01/28/2010 1057   GLUCOSE 118* 01/28/2010 1057   BUN 20 01/28/2010 1057   CREATININE 1.18 01/28/2010 1057   CALCIUM 8.9 01/28/2010 1057   GFRNONAA 60* 01/28/2010 1057   GFRAA  Value: >60        The eGFR has been calculated using the MDRD equation. This calculation has not been validated in all clinical situations. eGFR's persistently <60 mL/min signify possible Chronic Kidney Disease. 01/28/2010 1057     Hepatic Function Panel     Component Value Date/Time   PROT 6.7 01/27/2010 1221   ALBUMIN 4.2 01/27/2010 1221   AST 13 01/27/2010 1221   ALT 6 01/27/2010 1221    ALKPHOS 64 01/27/2010 1221   BILITOT 0.8 01/27/2010 1221   BILIDIR 0.2 01/27/2010 1221     CBC    Component Value Date/Time   WBC 3.9* 01/28/2010 1057   RBC 3.61* 01/28/2010 1057   HGB 11.9* 01/28/2010 1057   HCT 34.5* 01/28/2010 1057   PLT 109* 01/28/2010 1057   MCV 95.6 01/28/2010 1057   MCH 32.9 01/28/2010 1057   MCHC 34.4 01/28/2010 1057   RDW 13.1 01/28/2010 1057   LYMPHSABS 0.7 01/28/2010 1057   MONOABS 0.3 01/28/2010 1057   EOSABS 0.0 01/28/2010 1057   BASOSABS 0.0 01/28/2010 1057     BNP    Component Value Date/Time   PROBNP 259.0* 07/17/2007 1512    Lipid Panel     Component Value Date/Time   CHOL 136 01/27/2010 1221   TRIG 85.0 01/27/2010 1221   HDL 37.50* 01/27/2010 1221   CHOLHDL 4 01/27/2010 1221   VLDL  17.0 01/27/2010 1221   LDLCALC 82 01/27/2010 1221      RADIOLOGY: No results found.   ASSESSMENT AND PLAN: Mr. Tyan Dy is an 80 year old gentleman with documented coronary artery disease and who has undergone prior coronary intervention to at least two blood vessels over 10 years ago.  He cannot recall his last stress test, but believes this is well over 5 years ago.  His ECG today reveals atrial fibrillation.  He is uncertain of when he may have evolved into this abnormal rhythm.  At present, I do not have any laboratory available to know his creatinine clearance.  I will empirically start him on low-dose Xarelto at 15 mg in light of his age of greater than 10 years.  I'm scheduling him for an echo Doppler study to evaluate systolic and diastolic function, chamber dimensions and valvular architecture.  In light of his episodes of chest pain and prior cardiac history with 3 previously placed stents I am  scheduling him for LexiScan Myoview study.  I am adding low-dose Toprol-XL 25 mg to his medical regimen in light of his coronary disease and atrial fibrillation.  A complete set of blood work will be obtained consisting of CMP, CBC, magnesium level, thyroid function  studies, lipid panel.  Also scheduling him for PA and lateral chest x-ray.  I will see him back in the office in 4-6 weeks for followup evaluation and further recommendations are made at that time.   Troy Sine, MD, [facility] 12/29/2013 6:15 PM

## 2013-12-30 ENCOUNTER — Telehealth (HOSPITAL_COMMUNITY): Payer: Self-pay | Admitting: *Deleted

## 2013-12-31 ENCOUNTER — Telehealth (HOSPITAL_COMMUNITY): Payer: Self-pay | Admitting: *Deleted

## 2014-01-08 ENCOUNTER — Telehealth (HOSPITAL_COMMUNITY): Payer: Self-pay

## 2014-01-08 NOTE — Telephone Encounter (Signed)
Awaiting return call

## 2014-01-14 ENCOUNTER — Encounter (HOSPITAL_COMMUNITY): Payer: Commercial Managed Care - HMO

## 2014-01-14 ENCOUNTER — Ambulatory Visit (HOSPITAL_COMMUNITY): Payer: Commercial Managed Care - HMO

## 2014-08-26 ENCOUNTER — Ambulatory Visit (INDEPENDENT_AMBULATORY_CARE_PROVIDER_SITE_OTHER): Payer: Commercial Managed Care - HMO | Admitting: *Deleted

## 2014-08-26 DIAGNOSIS — Z23 Encounter for immunization: Secondary | ICD-10-CM

## 2014-09-04 ENCOUNTER — Other Ambulatory Visit: Payer: Self-pay | Admitting: Internal Medicine

## 2014-10-05 DIAGNOSIS — R3915 Urgency of urination: Secondary | ICD-10-CM | POA: Diagnosis not present

## 2014-10-05 DIAGNOSIS — R300 Dysuria: Secondary | ICD-10-CM | POA: Diagnosis not present

## 2014-10-05 DIAGNOSIS — Z125 Encounter for screening for malignant neoplasm of prostate: Secondary | ICD-10-CM | POA: Diagnosis not present

## 2014-10-05 DIAGNOSIS — N401 Enlarged prostate with lower urinary tract symptoms: Secondary | ICD-10-CM | POA: Diagnosis not present

## 2015-03-11 ENCOUNTER — Telehealth: Payer: Self-pay | Admitting: Cardiovascular Disease

## 2015-03-11 NOTE — Telephone Encounter (Signed)
This message was left on my voicemail on Monday,I was out until today,03-11-15. Message: I would like to talk to Dr Claiborne Billings.

## 2015-03-11 NOTE — Telephone Encounter (Signed)
No answer when dialed. 

## 2015-04-14 ENCOUNTER — Ambulatory Visit (INDEPENDENT_AMBULATORY_CARE_PROVIDER_SITE_OTHER): Payer: Commercial Managed Care - HMO | Admitting: *Deleted

## 2015-04-14 DIAGNOSIS — Z23 Encounter for immunization: Secondary | ICD-10-CM | POA: Diagnosis not present

## 2015-08-18 ENCOUNTER — Telehealth: Payer: Self-pay | Admitting: Cardiovascular Disease

## 2015-08-18 NOTE — Telephone Encounter (Signed)
Follow Up   Pt called states that he has been trying to reach [facility] Dr. Evette Georges nurse for sometime. He is requesting a call back. He states that he is simply returning her call.

## 2015-10-09 DIAGNOSIS — Z87891 Personal history of nicotine dependence: Secondary | ICD-10-CM | POA: Diagnosis not present

## 2015-10-09 DIAGNOSIS — W57XXXA Bitten or stung by nonvenomous insect and other nonvenomous arthropods, initial encounter: Secondary | ICD-10-CM | POA: Diagnosis not present

## 2015-10-09 DIAGNOSIS — Z79899 Other long term (current) drug therapy: Secondary | ICD-10-CM | POA: Diagnosis not present

## 2015-10-09 DIAGNOSIS — S70361A Insect bite (nonvenomous), right thigh, initial encounter: Secondary | ICD-10-CM | POA: Diagnosis not present

## 2016-04-17 ENCOUNTER — Ambulatory Visit (INDEPENDENT_AMBULATORY_CARE_PROVIDER_SITE_OTHER): Payer: Commercial Managed Care - HMO

## 2016-04-17 DIAGNOSIS — Z23 Encounter for immunization: Secondary | ICD-10-CM | POA: Diagnosis not present

## 2017-04-19 ENCOUNTER — Ambulatory Visit (INDEPENDENT_AMBULATORY_CARE_PROVIDER_SITE_OTHER): Payer: Medicare HMO

## 2017-04-19 DIAGNOSIS — Z23 Encounter for immunization: Secondary | ICD-10-CM

## 2018-02-22 DIAGNOSIS — Z0000 Encounter for general adult medical examination without abnormal findings: Secondary | ICD-10-CM | POA: Diagnosis not present

## 2018-02-22 DIAGNOSIS — E1165 Type 2 diabetes mellitus with hyperglycemia: Secondary | ICD-10-CM | POA: Diagnosis not present

## 2018-02-22 DIAGNOSIS — I10 Essential (primary) hypertension: Secondary | ICD-10-CM | POA: Diagnosis not present

## 2018-02-22 DIAGNOSIS — G301 Alzheimer's disease with late onset: Secondary | ICD-10-CM | POA: Diagnosis not present

## 2018-03-08 DIAGNOSIS — Z1389 Encounter for screening for other disorder: Secondary | ICD-10-CM | POA: Diagnosis not present

## 2018-03-08 DIAGNOSIS — Z23 Encounter for immunization: Secondary | ICD-10-CM | POA: Diagnosis not present

## 2018-03-08 DIAGNOSIS — G301 Alzheimer's disease with late onset: Secondary | ICD-10-CM | POA: Diagnosis not present

## 2018-03-08 DIAGNOSIS — Z0001 Encounter for general adult medical examination with abnormal findings: Secondary | ICD-10-CM | POA: Diagnosis not present

## 2018-03-08 DIAGNOSIS — Z1331 Encounter for screening for depression: Secondary | ICD-10-CM | POA: Diagnosis not present

## 2018-03-27 DIAGNOSIS — Z111 Encounter for screening for respiratory tuberculosis: Secondary | ICD-10-CM | POA: Diagnosis not present

## 2018-03-27 DIAGNOSIS — Z23 Encounter for immunization: Secondary | ICD-10-CM | POA: Diagnosis not present

## 2018-03-28 DIAGNOSIS — L0291 Cutaneous abscess, unspecified: Secondary | ICD-10-CM | POA: Diagnosis not present

## 2018-09-09 ENCOUNTER — Encounter (HOSPITAL_COMMUNITY): Payer: Self-pay

## 2018-09-09 ENCOUNTER — Emergency Department (HOSPITAL_COMMUNITY): Payer: Medicare HMO

## 2018-09-09 ENCOUNTER — Inpatient Hospital Stay (HOSPITAL_COMMUNITY)
Admission: EM | Admit: 2018-09-09 | Discharge: 2018-09-14 | DRG: 062 | Disposition: A | Discharge status: Skilled Nursing Facility | Payer: Medicare HMO | Source: Skilled Nursing Facility | Attending: Neurology | Admitting: Neurology

## 2018-09-09 ENCOUNTER — Other Ambulatory Visit: Payer: Self-pay

## 2018-09-09 DIAGNOSIS — R471 Dysarthria and anarthria: Secondary | ICD-10-CM | POA: Diagnosis present

## 2018-09-09 DIAGNOSIS — I10 Essential (primary) hypertension: Secondary | ICD-10-CM | POA: Diagnosis present

## 2018-09-09 DIAGNOSIS — E538 Deficiency of other specified B group vitamins: Secondary | ICD-10-CM | POA: Diagnosis present

## 2018-09-09 DIAGNOSIS — R404 Transient alteration of awareness: Secondary | ICD-10-CM | POA: Diagnosis not present

## 2018-09-09 DIAGNOSIS — G8191 Hemiplegia, unspecified affecting right dominant side: Secondary | ICD-10-CM | POA: Diagnosis not present

## 2018-09-09 DIAGNOSIS — I6522 Occlusion and stenosis of left carotid artery: Secondary | ICD-10-CM | POA: Diagnosis present

## 2018-09-09 DIAGNOSIS — I63232 Cerebral infarction due to unspecified occlusion or stenosis of left carotid arteries: Secondary | ICD-10-CM | POA: Diagnosis not present

## 2018-09-09 DIAGNOSIS — I4891 Unspecified atrial fibrillation: Secondary | ICD-10-CM | POA: Diagnosis present

## 2018-09-09 DIAGNOSIS — R402411 Glasgow coma scale score 13-15, in the field [EMT or ambulance]: Secondary | ICD-10-CM | POA: Diagnosis not present

## 2018-09-09 DIAGNOSIS — I2510 Atherosclerotic heart disease of native coronary artery without angina pectoris: Secondary | ICD-10-CM | POA: Diagnosis present

## 2018-09-09 DIAGNOSIS — D649 Anemia, unspecified: Secondary | ICD-10-CM | POA: Diagnosis present

## 2018-09-09 DIAGNOSIS — Z7901 Long term (current) use of anticoagulants: Secondary | ICD-10-CM

## 2018-09-09 DIAGNOSIS — F0390 Unspecified dementia without behavioral disturbance: Secondary | ICD-10-CM | POA: Diagnosis not present

## 2018-09-09 DIAGNOSIS — R41841 Cognitive communication deficit: Secondary | ICD-10-CM | POA: Diagnosis not present

## 2018-09-09 DIAGNOSIS — Z7982 Long term (current) use of aspirin: Secondary | ICD-10-CM

## 2018-09-09 DIAGNOSIS — I6300 Cerebral infarction due to thrombosis of unspecified precerebral artery: Secondary | ICD-10-CM | POA: Diagnosis not present

## 2018-09-09 DIAGNOSIS — E785 Hyperlipidemia, unspecified: Secondary | ICD-10-CM | POA: Diagnosis not present

## 2018-09-09 DIAGNOSIS — Z79899 Other long term (current) drug therapy: Secondary | ICD-10-CM

## 2018-09-09 DIAGNOSIS — I708 Atherosclerosis of other arteries: Secondary | ICD-10-CM | POA: Diagnosis present

## 2018-09-09 DIAGNOSIS — R4701 Aphasia: Secondary | ICD-10-CM | POA: Diagnosis present

## 2018-09-09 DIAGNOSIS — Z8551 Personal history of malignant neoplasm of bladder: Secondary | ICD-10-CM

## 2018-09-09 DIAGNOSIS — I480 Paroxysmal atrial fibrillation: Secondary | ICD-10-CM | POA: Diagnosis present

## 2018-09-09 DIAGNOSIS — I6502 Occlusion and stenosis of left vertebral artery: Secondary | ICD-10-CM | POA: Diagnosis not present

## 2018-09-09 DIAGNOSIS — R29726 NIHSS score 26: Secondary | ICD-10-CM | POA: Diagnosis present

## 2018-09-09 DIAGNOSIS — Z9049 Acquired absence of other specified parts of digestive tract: Secondary | ICD-10-CM | POA: Diagnosis not present

## 2018-09-09 DIAGNOSIS — D181 Lymphangioma, any site: Secondary | ICD-10-CM | POA: Diagnosis present

## 2018-09-09 DIAGNOSIS — M2550 Pain in unspecified joint: Secondary | ICD-10-CM | POA: Diagnosis not present

## 2018-09-09 DIAGNOSIS — R29810 Facial weakness: Secondary | ICD-10-CM | POA: Diagnosis not present

## 2018-09-09 DIAGNOSIS — Z87891 Personal history of nicotine dependence: Secondary | ICD-10-CM | POA: Diagnosis not present

## 2018-09-09 DIAGNOSIS — I63412 Cerebral infarction due to embolism of left middle cerebral artery: Secondary | ICD-10-CM | POA: Diagnosis not present

## 2018-09-09 DIAGNOSIS — Z86010 Personal history of colonic polyps: Secondary | ICD-10-CM

## 2018-09-09 DIAGNOSIS — I4821 Permanent atrial fibrillation: Secondary | ICD-10-CM | POA: Diagnosis not present

## 2018-09-09 DIAGNOSIS — R413 Other amnesia: Secondary | ICD-10-CM | POA: Diagnosis not present

## 2018-09-09 DIAGNOSIS — R531 Weakness: Secondary | ICD-10-CM | POA: Diagnosis not present

## 2018-09-09 DIAGNOSIS — Z8249 Family history of ischemic heart disease and other diseases of the circulatory system: Secondary | ICD-10-CM

## 2018-09-09 DIAGNOSIS — R410 Disorientation, unspecified: Secondary | ICD-10-CM | POA: Diagnosis not present

## 2018-09-09 DIAGNOSIS — R29818 Other symptoms and signs involving the nervous system: Secondary | ICD-10-CM | POA: Diagnosis not present

## 2018-09-09 DIAGNOSIS — I6359 Cerebral infarction due to unspecified occlusion or stenosis of other cerebral artery: Secondary | ICD-10-CM | POA: Diagnosis not present

## 2018-09-09 DIAGNOSIS — C679 Malignant neoplasm of bladder, unspecified: Secondary | ICD-10-CM | POA: Diagnosis not present

## 2018-09-09 DIAGNOSIS — G459 Transient cerebral ischemic attack, unspecified: Secondary | ICD-10-CM | POA: Diagnosis not present

## 2018-09-09 DIAGNOSIS — R4781 Slurred speech: Secondary | ICD-10-CM | POA: Diagnosis not present

## 2018-09-09 DIAGNOSIS — I340 Nonrheumatic mitral (valve) insufficiency: Secondary | ICD-10-CM | POA: Diagnosis not present

## 2018-09-09 DIAGNOSIS — R000 Tachycardia, unspecified: Secondary | ICD-10-CM | POA: Diagnosis not present

## 2018-09-09 DIAGNOSIS — I6340 Cerebral infarction due to embolism of unspecified cerebral artery: Secondary | ICD-10-CM | POA: Diagnosis not present

## 2018-09-09 DIAGNOSIS — Q211 Atrial septal defect: Secondary | ICD-10-CM

## 2018-09-09 DIAGNOSIS — Z7401 Bed confinement status: Secondary | ICD-10-CM | POA: Diagnosis not present

## 2018-09-09 DIAGNOSIS — I639 Cerebral infarction, unspecified: Secondary | ICD-10-CM | POA: Diagnosis present

## 2018-09-09 DIAGNOSIS — I371 Nonrheumatic pulmonary valve insufficiency: Secondary | ICD-10-CM | POA: Diagnosis present

## 2018-09-09 DIAGNOSIS — I361 Nonrheumatic tricuspid (valve) insufficiency: Secondary | ICD-10-CM | POA: Diagnosis not present

## 2018-09-09 DIAGNOSIS — I63032 Cerebral infarction due to thrombosis of left carotid artery: Secondary | ICD-10-CM | POA: Diagnosis not present

## 2018-09-09 LAB — CBC
HCT: 37.7 % — ABNORMAL LOW (ref 39.0–52.0)
Hemoglobin: 12.2 g/dL — ABNORMAL LOW (ref 13.0–17.0)
MCH: 31.8 pg (ref 26.0–34.0)
MCHC: 32.4 g/dL (ref 30.0–36.0)
MCV: 98.2 fL (ref 80.0–100.0)
Platelets: 146 10*3/uL — ABNORMAL LOW (ref 150–400)
RBC: 3.84 MIL/uL — ABNORMAL LOW (ref 4.22–5.81)
RDW: 13.2 % (ref 11.5–15.5)
WBC: 4.8 10*3/uL (ref 4.0–10.5)
nRBC: 0 % (ref 0.0–0.2)

## 2018-09-09 LAB — PROTIME-INR
INR: 1 (ref 0.8–1.2)
Prothrombin Time: 13.4 seconds (ref 11.4–15.2)

## 2018-09-09 LAB — COMPREHENSIVE METABOLIC PANEL
ALT: 6 U/L (ref 0–44)
AST: 18 U/L (ref 15–41)
Albumin: 3.7 g/dL (ref 3.5–5.0)
Alkaline Phosphatase: 73 U/L (ref 38–126)
Anion gap: 5 (ref 5–15)
BUN: 17 mg/dL (ref 8–23)
CO2: 29 mmol/L (ref 22–32)
Calcium: 9 mg/dL (ref 8.9–10.3)
Chloride: 106 mmol/L (ref 98–111)
Creatinine, Ser: 1.18 mg/dL (ref 0.61–1.24)
GFR calc Af Amer: >60 mL/min (ref >60)
GFR calc non Af Amer: 56 mL/min — ABNORMAL LOW (ref >60)
Glucose, Bld: 128 mg/dL — ABNORMAL HIGH (ref 70–99)
Potassium: 3.6 mmol/L (ref 3.5–5.1)
Sodium: 140 mmol/L (ref 135–145)
Total Bilirubin: 1.2 mg/dL (ref 0.3–1.2)
Total Protein: 6.4 g/dL — ABNORMAL LOW (ref 6.5–8.1)

## 2018-09-09 LAB — DIFFERENTIAL
Abs Immature Granulocytes: 0 10*3/uL (ref 0.00–0.07)
Basophils Absolute: 0.1 10*3/uL (ref 0.0–0.1)
Basophils Relative: 1 %
Eosinophils Absolute: 0.1 10*3/uL (ref 0.0–0.5)
Eosinophils Relative: 2 %
Immature Granulocytes: 0 %
Lymphocytes Relative: 23 %
Lymphs Abs: 1.1 10*3/uL (ref 0.7–4.0)
Monocytes Absolute: 0.6 10*3/uL (ref 0.1–1.0)
Monocytes Relative: 12 %
Neutro Abs: 3 10*3/uL (ref 1.7–7.7)
Neutrophils Relative %: 62 %

## 2018-09-09 LAB — APTT: aPTT: 34 seconds (ref 24–36)

## 2018-09-09 LAB — ETHANOL: Alcohol, Ethyl (B): <10 mg/dL (ref <10)

## 2018-09-09 MED ORDER — PANTOPRAZOLE SODIUM 40 MG IV SOLR
40.0000 mg | Freq: Every day | INTRAVENOUS | Status: DC
  Administered 2018-09-09 – 2018-09-12 (×4): 40 mg via INTRAVENOUS
  Filled 2018-09-09 (×4): qty 40

## 2018-09-09 MED ORDER — SODIUM CHLORIDE 0.9 % IV SOLN: 50.0000 mL | Freq: Once | INTRAVENOUS | Status: DC

## 2018-09-09 MED ORDER — SODIUM CHLORIDE 0.9 % IV SOLN
50.0000 mL/h | INTRAVENOUS | Status: DC
  Administered 2018-09-10 – 2018-09-12 (×4): 50 mL/h via INTRAVENOUS

## 2018-09-09 MED ORDER — ACETAMINOPHEN 325 MG PO TABS: 650.0000 mg | ORAL_TABLET | ORAL | Status: DC | PRN

## 2018-09-09 MED ORDER — IOPAMIDOL (ISOVUE-370) INJECTION 76%
150.0000 mL | Freq: Once | INTRAVENOUS | Status: AC | PRN
  Administered 2018-09-09: 115 mL via INTRAVENOUS

## 2018-09-09 MED ORDER — STROKE: EARLY STAGES OF RECOVERY BOOK
Freq: Once | Status: AC
  Administered 2018-09-10: 01:00:00

## 2018-09-09 MED ORDER — ALTEPLASE 100 MG IV SOLR
INTRAVENOUS | Status: AC
  Administered 2018-09-09: 63.3 mg via INTRAVENOUS
  Filled 2018-09-09: qty 100

## 2018-09-09 MED ORDER — NICARDIPINE HCL IN NACL 20-0.86 MG/200ML-% IV SOLN
0.0000 mg/h | INTRAVENOUS | Status: DC | PRN
  Filled 2018-09-09: qty 200

## 2018-09-09 MED ORDER — LABETALOL HCL 5 MG/ML IV SOLN: 10.0000 mg | Freq: Once | INTRAVENOUS | Status: DC | PRN

## 2018-09-09 MED ORDER — ACETAMINOPHEN 160 MG/5ML PO SOLN: 650.0000 mg | ORAL | Status: DC | PRN

## 2018-09-09 MED ORDER — ALTEPLASE (STROKE) FULL DOSE INFUSION
0.9000 mg/kg | Freq: Once | INTRAVENOUS | Status: AC
  Administered 2018-09-09: 63.3 mg via INTRAVENOUS
  Filled 2018-09-09: qty 100

## 2018-09-09 MED ORDER — ACETAMINOPHEN 650 MG RE SUPP: 650.0000 mg | RECTAL | Status: DC | PRN

## 2018-09-09 NOTE — H&P (Signed)
Neurology H&P  CC: Right-sided weakness  History is obtained from:patient, Chart review, nursing home  HPI: [name] is a 85 y.o. male with a history of atrial fibrillation as well as hygroma who was taken off of anticoagulation approximately 1 year ago.  I called his nursing home to discuss what his baseline status is, and he is ambulatory, and able to speak, but has significant dementia.  Though he feeds himself, he has to be told when to go to eat, has to be given his medications, has to essentially be lead through the day.  He was in his normal state of health earlier tonight, then sometime after 6:30 PM, he was found to have significant right-sided weakness.  He was taken to [facility] where he was given IV TPA and a CT angiogram revealed a distal carotid occlusion  He was transferred to [facility] for consideration of emergent thrombectomy.  LKW: 6:30 PM tpa given?:  Yes Modified Rankin Scale: 3-Moderate disability-requires help but walks WITHOUT assistance  ROS:  Unable to obtain due to aphasia  Past Medical History:  Diagnosis Date  . [name] deficiency 2011  . Bladder cancer ([place])   . CAD (coronary artery disease)   . Hx of colonic polyp   . Hyperlipidemia   . Memory loss 2011     Family History  Problem Relation Age of Onset  . Coronary artery disease Other   . Dementia Other      Social History:  reports that he quit smoking about 44 years ago. He has never used smokeless tobacco. He reports that he does not drink alcohol. No history on file for drug.   Exam: Current vital signs: BP 135/70 (BP Location: Left Arm) Comment: arrived to 4N  Pulse 86   Temp 98.2 F (36.8 C) (Oral)   Resp 14   Ht 5\' 10"  (1.778 m)   Wt 70.3 kg   SpO2 100%   BMI 22.24 kg/m  Vital signs in last 24 hours: Temp:  [97.5 F (36.4 C)-98.2 F (36.8 C)] 98.2 F (36.8 C) (03/02 2159) Pulse Rate:  [71-86] 86 (03/02 2145) Resp:  [10-27] 14 (03/02 2145) BP:  (117-135)/(59-93) 135/70 (03/02 2145) SpO2:  [98 %-100 %] 100 % (03/02 2145) Weight:  [70.3 kg] 70.3 kg (03/02 1957)  Physical Exam  Constitutional: Appears well-developed and well-nourished.  Psych: Affect appropriate to situation Eyes: No scleral injection HENT: No OP obstrucion Head: Normocephalic.  Cardiovascular: Normal rate and regular rhythm.  Respiratory: Effort normal and breath sounds normal to anterior ascultation GI: Soft.  No distension. There is no tenderness.  Skin: WDI  Neuro: Mental Status: Patient is awake, alert, able to tell me his name, and follows the command of closes eyes, but does not make a fist.  His speech is mostly nonsensical. Cranial Nerves: II: Visual Fields are full. Pupils are equal, round, and reactive to light.   III,IV, VI: EOMI without ptosis or diploplia.  V: Facial sensation is diminished on the right VII: Facial movement is diminished on the right VIII: hearing is intact to voice XII: tongue is midline without atrophy or fasciculations.  Motor: He has significant right-sided weakness, but is able to lift his arm against gravity, he has 4-/5 strength in both the right arm and leg, 5/5 on the left. Sensory: Sensation is diminished in the right arm and leg as evidenced by less reaction to noxious stimulation than on the left.   Deep Tendon Reflexes: 2+ and symmetric  in the biceps and patellae.  Cerebellar: He has no clear ataxia when moving his left arm, he does not move his right enough to assess if it is out of proportion to weakness.   I have reviewed labs in epic and the results pertinent to this consultation are: CMP- borderline glucose at 128 CBC-mild anemia at 12.2  I have reviewed the images obtained: CT head-aspects 10, CTA- distal left carotid occlusion, CTP- limited due to motion  Primary Diagnosis:  Cerebral infarction due to embolism of left middle cerebral artery  Secondary Diagnosis: Paroxysmal atrial  fibrillation   Impression: 85 year old male with likely embolic stroke due to atrial fibrillation who received IV TPA at [facility].  Unfortunately due to his compromised functionality at baseline, any intervention in his case would fall outside standard of care as established by studies that only included patients up to MRIs 2.  I called his contact for his guardian and left a voicemail.  At this time, especially given lack of readily consentable family and improvement with IV TPA, I think that inclusion in an IRB protocol would not be prudent and I discussed this with Dr. Estanislado Pandy who agreed.  Recommendations: -Post TPA protocol - HgbA1c, fasting lipid panel - MRI of the brain without contrast - Frequent neuro checks - Echocardiogram - Prophylactic therapy-none for 24 hours - Risk factor modification - Telemetry monitoring - PT consult, OT consult, Speech consult -BP goal less than 180/105 - Stroke team to follow   This patient is critically ill and at significant risk of neurological worsening, death and care requires constant monitoring of vital signs, hemodynamics,respiratory and cardiac monitoring, neurological assessment, discussion with family, other specialists and medical decision making of high complexity. I spent 50 minutes of neurocritical care time  in the care of  this patient. This was time spent independent of any time provided by nurse practitioner or PA.  Roland Rack, MD [facility] [phone]  If 7pm- 7am, please page neurology on call as listed in [place]. 09/09/2018  11:06 PM

## 2018-09-09 NOTE — ED Provider Notes (Signed)
[facility] EMERGENCY DEPARTMENT Provider Note   CSN: [id number] Arrival date & time: 09/09/18  1930    History   Chief Complaint Chief Complaint  Patient presents with  . Code Stroke    HPI TRUMAINE WIMER is a 85 y.o. male.     HPI Level 5 caveat for dementia and acuity of the condition.  85 year old male with history of CAD, A. fib, hyperlipidemia who is no longer on Xarelto according to [facility] nursing facility and awarded to state comes to the ER with chief complaint of right-sided weakness and slurred speech.  According to the nursing home personnel, they had supper around 530 and patient was acting normally.  Patient was last seen normal around 6:30 PM after which he was noted to have slurred speech and right-sided weakness.  At baseline patient is able to ambulate.  They informed me that patient has been awarded to [place], contact information listed is for [place] at [phone] extension [phone].   Past Medical History:  Diagnosis Date  . [name] deficiency 2011  . Bladder cancer ([place])   . CAD (coronary artery disease)   . Hx of colonic polyp   . Hyperlipidemia   . Memory loss 2011    Patient Active Problem List   Diagnosis Date Noted  . 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 Chest pain 12/29/2013  . Atrial fibrillation ([place]) 12/29/2013  . VERTIGO 02/04/2010  . [name] DEFICIENCY 01/31/2010  . MEMORY LOSS 01/27/2010  . PARESTHESIA 01/27/2010  . HYPERLIPIDEMIA 11/02/2009  . CORONARY ARTERY DISEASE 11/02/2009  . SINUSITIS, ACUTE 11/02/2009  . EPISTAXIS 11/02/2009  . COLONIC POLYPS, HX OF 11/02/2009    Past Surgical History:  Procedure Laterality Date  . CHOLECYSTECTOMY          Home Medications    Prior to Admission medications   Medication Sig Start Date End Date Taking? Authorizing Provider  Ascorbic Acid (VITAMIN C PO) Take 1 tablet by mouth daily.    [provider]  aspirin 81 MG tablet Take 81 mg by mouth daily.      [provider]  Cholecalciferol 1000 UNITS tablet Take 1,000 Units by mouth daily.      [provider]  metoprolol succinate (TOPROL XL) 25 MG 24 hr tablet Take 1 tablet (25 mg total) by mouth daily. 12/29/13   Troy Sine, MD  Rivaroxaban (XARELTO) 15 MG TABS tablet Take 1 tablet (15 mg total) by mouth daily. 12/29/13   Troy Sine, MD  vitamin E 400 UNIT capsule Take 400 Units by mouth daily.    [provider]    Family History Family History  Problem Relation Age of Onset  . Coronary artery disease Other   . Dementia Other     Social History Social History   Tobacco Use  . Smoking status: Former Smoker    Last attempt to quit: 12/29/1973    Years since quitting: 44.7  . Smokeless tobacco: Never Used  Substance Use Topics  . Alcohol use: No  . Drug use: Not on file     Allergies   Patient has no known allergies.   Review of Systems Review of Systems  Unable to perform ROS: Acuity of condition     Physical Exam Updated Vital Signs BP 135/70 (BP Location: Left Arm) Comment: arrived to 4N  Pulse 86   Temp 98.2 F (36.8 C) (Oral)   Resp 14   Ht 5\' 10"  (1.778 m)   Wt  70.3 kg   SpO2 100%   BMI 22.24 kg/m   Physical Exam Vitals signs and nursing note reviewed.  Constitutional:      Appearance: He is well-developed.  HENT:     Head: Atraumatic.  Neck:     Musculoskeletal: Neck supple.  Cardiovascular:     Rate and Rhythm: Normal rate.  Pulmonary:     Effort: Pulmonary effort is normal.  Musculoskeletal:     Comments: Patient is noted to have right-sided weakness, right-sided facial droop and dysarthria.  Skin:    General: Skin is warm.  Neurological:     Mental Status: He is alert.     Comments: Patient is noted to have right-sided upper extremity weakness, right-sided facial droop and dysarthria.      ED Treatments / Results  Labs (all labs ordered are listed, but only abnormal results are displayed) Labs Reviewed  CBC  - Abnormal; Notable for the following components:      Result Value   RBC 3.84 (*)    Hemoglobin 12.2 (*)    HCT 37.7 (*)    Platelets 146 (*)    All other components within normal limits  COMPREHENSIVE METABOLIC PANEL - Abnormal; Notable for the following components:   Glucose, Bld 128 (*)    Total Protein 6.4 (*)    GFR calc non Af Amer 56 (*)    All other components within normal limits  ETHANOL  PROTIME-INR  APTT  DIFFERENTIAL  CBC  HEMOGLOBIN A1C  LIPID PANEL  I-STAT CREATININE, ED    EKG None  Radiology Ct Angio Head W Or Wo Contrast  Result Date: 09/09/2018 CLINICAL DATA:  Code stroke. RIGHT-sided weakness and aphasia. History of bladder cancer and memory loss. EXAM: CT ANGIOGRAPHY HEAD AND NECK CT PERFUSION BRAIN TECHNIQUE: Multidetector CT imaging of the head and neck was performed using the standard protocol during bolus administration of intravenous contrast. Multiplanar CT image reconstructions and MIPs were obtained to evaluate the vascular anatomy. Carotid stenosis measurements (when applicable) are obtained utilizing NASCET criteria, using the distal internal carotid diameter as the denominator. Multiphase CT imaging of the brain was performed following IV bolus contrast injection. Subsequent parametric perfusion maps were calculated using RAPID software. CONTRAST:  172mL ISOVUE-370 IOPAMIDOL (ISOVUE-370) INJECTION 76% COMPARISON:  CT HEAD February 07, 2010 FINDINGS: CT HEAD FINDINGS BRAIN: No intraparenchymal hemorrhage, mass effect nor midline shift. No parenchymal brain volume loss for age. No hydrocephalus. Patchy supratentorial white matter hypodensities less than expected for patient's age, though non-specific are most compatible with chronic small vessel ischemic disease. No acute large vascular territory infarcts. Streak artifact through middle cranial fossa limited assessment. No abnormal extra-axial fluid collections. Basal cisterns are patent. VASCULAR: Trace  calcific atherosclerosis of the carotid siphons. SKULL: No skull fracture. No significant scalp soft tissue swelling. SINUSES/ORBITS: Trace paranasal sinus mucosal thickening. Mastoid air cells are well aerated.The included ocular globes and orbital contents are non-suspicious. Status post bilateral ocular lens implants. OTHER: Patient is edentulous. ASPECTS ([place] Stroke Program Early CT Score) - Ganglionic level infarction (caudate, lentiform nuclei, internal capsule, insula, M1-M3 cortex): 7 - Supraganglionic infarction (M4-M6 cortex): 3 Total score (0-10 with 10 being normal): 10 CTA NECK FINDINGS: AORTIC ARCH: Normal appearance of the thoracic arch, normal branch pattern. Moderate calcific atherosclerosis aortic arch. The origins of the innominate, left Common carotid artery and subclavian artery are patent. 2.8 segment occluded RIGHT subclavian artery origin with reconstitution. RIGHT CAROTID SYSTEM: Common carotid artery is patent. Mild calcific  atherosclerosis of the carotid bifurcation without hemodynamically significant stenosis by NASCET criteria. Patent internal carotid artery, tonsillar loop. LEFT CAROTID SYSTEM: Common carotid artery is patent, mild atherosclerosis. Normal appearance of the carotid bifurcation without hemodynamically significant stenosis by NASCET criteria. Patent internal carotid artery, tonsillar loop. VERTEBRAL ARTERIES:Left vertebral artery is dominant. Normal appearance of the vertebral arteries, widely patent. SKELETON: No acute osseous process though bone windows have not been submitted. Multiple thyroid nodules without dominant thyroid. No routine indicated follow-up. OTHER NECK: Soft tissues of the neck are nonacute though, not tailored for evaluation. Debris within hypopharynx. UPPER CHEST: Included lung apices are clear. Mild centrilobular emphysema. No superior mediastinal lymphadenopathy. CTA HEAD FINDINGS: ANTERIOR CIRCULATION: Patent cervical internal carotid arteries,  petrous, cavernous internal carotid arteries. Sharp like defect LEFT paraclinoid ICA, occluded LEFT ICA carotid terminus. Distal patent anterior communicating artery. Patent anterior and middle cerebral arteries. No flow-limiting stenosis, contrast extravasation or aneurysm. POSTERIOR CIRCULATION: Patent vertebral arteries, vertebrobasilar junction and basilar artery, as well as main branch vessels. RIGHT vertebral artery predominantly terminates in the posterior inferior cerebellar artery. Severe stenosis RIGHT V4 segment. Patent posterior cerebral arteries. No large vessel occlusion, contrast extravasation or aneurysm. VENOUS SINUSES: Major dural venous sinuses are patent though not tailored for evaluation on this angiographic examination. ANATOMIC VARIANTS: None. DELAYED PHASE: Not performed. MIP images reviewed. CT Brain Perfusion Findings: CBF (<30%) Volume: 68mL Perfusion (Tmax>6.0s) volume: 326mL Mismatch Volume: 350mL Infarction Location:Infant at; severely motion degraded examination with LEFT > RIGHT confluent areas of penumbra most compatible with artifact. IMPRESSION: CT HEAD: 1. Negative non-contrast CT HEAD for age. CTA NECK: 1. No hemodynamically significant stenosis ICA's. Patent vertebral arteries. 2. 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 3. Patent vertebral arteries. Debris within hypopharynx, aspiration risk. CTA HEAD: 1. Emergent LEFT carotid terminus thromboembolism; less likely dissection. Patent cerebral arteries. 2. Severe stenosis RIGHT V4 segment. CT PERFUSION: 1. Nondiagnostic examination due to motion. Critical Value/emergent results were called by telephone at the time of interpretation on 09/09/2018 at 8:17 pm to Dr. Varney Biles , who verbally acknowledged these results. Aortic Atherosclerosis (ICD10-I70.0). Emphysema (ICD10-J43.9). Electronically Signed   By: Elon Alas M.D.   On: 09/09/2018 20:33   Ct Angio Neck W Or Wo Contrast  Result Date:  09/09/2018 CLINICAL DATA:  Code stroke. RIGHT-sided weakness and aphasia. History of bladder cancer and memory loss. EXAM: CT ANGIOGRAPHY HEAD AND NECK CT PERFUSION BRAIN TECHNIQUE: Multidetector CT imaging of the head and neck was performed using the standard protocol during bolus administration of intravenous contrast. Multiplanar CT image reconstructions and MIPs were obtained to evaluate the vascular anatomy. Carotid stenosis measurements (when applicable) are obtained utilizing NASCET criteria, using the distal internal carotid diameter as the denominator. Multiphase CT imaging of the brain was performed following IV bolus contrast injection. Subsequent parametric perfusion maps were calculated using RAPID software. CONTRAST:  169mL ISOVUE-370 IOPAMIDOL (ISOVUE-370) INJECTION 76% COMPARISON:  CT HEAD February 07, 2010 FINDINGS: CT HEAD FINDINGS BRAIN: No intraparenchymal hemorrhage, mass effect nor midline shift. No parenchymal brain volume loss for age. No hydrocephalus. Patchy supratentorial white matter hypodensities less than expected for patient's age, though non-specific are most compatible with chronic small vessel ischemic disease. No acute large vascular territory infarcts. Streak artifact through middle cranial fossa limited assessment. No abnormal extra-axial fluid collections. Basal cisterns are patent. VASCULAR: Trace calcific atherosclerosis of the carotid siphons. SKULL: No skull fracture. No significant scalp soft tissue swelling. SINUSES/ORBITS: Trace paranasal sinus mucosal thickening. Mastoid air  cells are well aerated.The included ocular globes and orbital contents are non-suspicious. Status post bilateral ocular lens implants. OTHER: Patient is edentulous. ASPECTS ([place] Stroke Program Early CT Score) - Ganglionic level infarction (caudate, lentiform nuclei, internal capsule, insula, M1-M3 cortex): 7 - Supraganglionic infarction (M4-M6 cortex): 3 Total score (0-10 with 10 being normal): 10  CTA NECK FINDINGS: AORTIC ARCH: Normal appearance of the thoracic arch, normal branch pattern. Moderate calcific atherosclerosis aortic arch. The origins of the innominate, left Common carotid artery and subclavian artery are patent. 2.8 segment occluded RIGHT subclavian artery origin with reconstitution. RIGHT CAROTID SYSTEM: Common carotid artery is patent. Mild calcific atherosclerosis of the carotid bifurcation without hemodynamically significant stenosis by NASCET criteria. Patent internal carotid artery, tonsillar loop. LEFT CAROTID SYSTEM: Common carotid artery is patent, mild atherosclerosis. Normal appearance of the carotid bifurcation without hemodynamically significant stenosis by NASCET criteria. Patent internal carotid artery, tonsillar loop. VERTEBRAL ARTERIES:Left vertebral artery is dominant. Normal appearance of the vertebral arteries, widely patent. SKELETON: No acute osseous process though bone windows have not been submitted. Multiple thyroid nodules without dominant thyroid. No routine indicated follow-up. OTHER NECK: Soft tissues of the neck are nonacute though, not tailored for evaluation. Debris within hypopharynx. UPPER CHEST: Included lung apices are clear. Mild centrilobular emphysema. No superior mediastinal lymphadenopathy. CTA HEAD FINDINGS: ANTERIOR CIRCULATION: Patent cervical internal carotid arteries, petrous, cavernous internal carotid arteries. Sharp like defect LEFT paraclinoid ICA, occluded LEFT ICA carotid terminus. Distal patent anterior communicating artery. Patent anterior and middle cerebral arteries. No flow-limiting stenosis, contrast extravasation or aneurysm. POSTERIOR CIRCULATION: Patent vertebral arteries, vertebrobasilar junction and basilar artery, as well as main branch vessels. RIGHT vertebral artery predominantly terminates in the posterior inferior cerebellar artery. Severe stenosis RIGHT V4 segment. Patent posterior cerebral arteries. No large vessel occlusion,  contrast extravasation or aneurysm. VENOUS SINUSES: Major dural venous sinuses are patent though not tailored for evaluation on this angiographic examination. ANATOMIC VARIANTS: None. DELAYED PHASE: Not performed. MIP images reviewed. CT Brain Perfusion Findings: CBF (<30%) Volume: 63mL Perfusion (Tmax>6.0s) volume: 351mL Mismatch Volume: 384mL Infarction Location:Infant at; severely motion degraded examination with LEFT > RIGHT confluent areas of penumbra most compatible with artifact. IMPRESSION: CT HEAD: 1. Negative non-contrast CT HEAD for age. CTA NECK: 1. No hemodynamically significant stenosis ICA's. Patent vertebral arteries. 2. 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 3. Patent vertebral arteries. Debris within hypopharynx, aspiration risk. CTA HEAD: 1. Emergent LEFT carotid terminus thromboembolism; less likely dissection. Patent cerebral arteries. 2. Severe stenosis RIGHT V4 segment. CT PERFUSION: 1. Nondiagnostic examination due to motion. Critical Value/emergent results were called by telephone at the time of interpretation on 09/09/2018 at 8:17 pm to Dr. Varney Biles , who verbally acknowledged these results. Aortic Atherosclerosis (ICD10-I70.0). Emphysema (ICD10-J43.9). Electronically Signed   By: Elon Alas M.D.   On: 09/09/2018 20:33   Ct Cerebral Perfusion W Contrast  Result Date: 09/09/2018 CLINICAL DATA:  Code stroke. RIGHT-sided weakness and aphasia. History of bladder cancer and memory loss. EXAM: CT ANGIOGRAPHY HEAD AND NECK CT PERFUSION BRAIN TECHNIQUE: Multidetector CT imaging of the head and neck was performed using the standard protocol during bolus administration of intravenous contrast. Multiplanar CT image reconstructions and MIPs were obtained to evaluate the vascular anatomy. Carotid stenosis measurements (when applicable) are obtained utilizing NASCET criteria, using the distal internal carotid diameter as the denominator. Multiphase CT imaging of  the brain was performed following IV bolus contrast injection. Subsequent parametric perfusion maps were calculated using RAPID software. CONTRAST:  166mL ISOVUE-370 IOPAMIDOL (ISOVUE-370) INJECTION 76% COMPARISON:  CT HEAD February 07, 2010 FINDINGS: CT HEAD FINDINGS BRAIN: No intraparenchymal hemorrhage, mass effect nor midline shift. No parenchymal brain volume loss for age. No hydrocephalus. Patchy supratentorial white matter hypodensities less than expected for patient's age, though non-specific are most compatible with chronic small vessel ischemic disease. No acute large vascular territory infarcts. Streak artifact through middle cranial fossa limited assessment. No abnormal extra-axial fluid collections. Basal cisterns are patent. VASCULAR: Trace calcific atherosclerosis of the carotid siphons. SKULL: No skull fracture. No significant scalp soft tissue swelling. SINUSES/ORBITS: Trace paranasal sinus mucosal thickening. Mastoid air cells are well aerated.The included ocular globes and orbital contents are non-suspicious. Status post bilateral ocular lens implants. OTHER: Patient is edentulous. ASPECTS ([place] Stroke Program Early CT Score) - Ganglionic level infarction (caudate, lentiform nuclei, internal capsule, insula, M1-M3 cortex): 7 - Supraganglionic infarction (M4-M6 cortex): 3 Total score (0-10 with 10 being normal): 10 CTA NECK FINDINGS: AORTIC ARCH: Normal appearance of the thoracic arch, normal branch pattern. Moderate calcific atherosclerosis aortic arch. The origins of the innominate, left Common carotid artery and subclavian artery are patent. 2.8 segment occluded RIGHT subclavian artery origin with reconstitution. RIGHT CAROTID SYSTEM: Common carotid artery is patent. Mild calcific atherosclerosis of the carotid bifurcation without hemodynamically significant stenosis by NASCET criteria. Patent internal carotid artery, tonsillar loop. LEFT CAROTID SYSTEM: Common carotid artery is patent, mild  atherosclerosis. Normal appearance of the carotid bifurcation without hemodynamically significant stenosis by NASCET criteria. Patent internal carotid artery, tonsillar loop. VERTEBRAL ARTERIES:Left vertebral artery is dominant. Normal appearance of the vertebral arteries, widely patent. SKELETON: No acute osseous process though bone windows have not been submitted. Multiple thyroid nodules without dominant thyroid. No routine indicated follow-up. OTHER NECK: Soft tissues of the neck are nonacute though, not tailored for evaluation. Debris within hypopharynx. UPPER CHEST: Included lung apices are clear. Mild centrilobular emphysema. No superior mediastinal lymphadenopathy. CTA HEAD FINDINGS: ANTERIOR CIRCULATION: Patent cervical internal carotid arteries, petrous, cavernous internal carotid arteries. Sharp like defect LEFT paraclinoid ICA, occluded LEFT ICA carotid terminus. Distal patent anterior communicating artery. Patent anterior and middle cerebral arteries. No flow-limiting stenosis, contrast extravasation or aneurysm. POSTERIOR CIRCULATION: Patent vertebral arteries, vertebrobasilar junction and basilar artery, as well as main branch vessels. RIGHT vertebral artery predominantly terminates in the posterior inferior cerebellar artery. Severe stenosis RIGHT V4 segment. Patent posterior cerebral arteries. No large vessel occlusion, contrast extravasation or aneurysm. VENOUS SINUSES: Major dural venous sinuses are patent though not tailored for evaluation on this angiographic examination. ANATOMIC VARIANTS: None. DELAYED PHASE: Not performed. MIP images reviewed. CT Brain Perfusion Findings: CBF (<30%) Volume: 65mL Perfusion (Tmax>6.0s) volume: 352mL Mismatch Volume: 339mL Infarction Location:Infant at; severely motion degraded examination with LEFT > RIGHT confluent areas of penumbra most compatible with artifact. IMPRESSION: CT HEAD: 1. Negative non-contrast CT HEAD for age. CTA NECK: 1. No hemodynamically  significant stenosis ICA's. Patent vertebral arteries. 2. 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 3. Patent vertebral arteries. Debris within hypopharynx, aspiration risk. CTA HEAD: 1. Emergent LEFT carotid terminus thromboembolism; less likely dissection. Patent cerebral arteries. 2. Severe stenosis RIGHT V4 segment. CT PERFUSION: 1. Nondiagnostic examination due to motion. Critical Value/emergent results were called by telephone at the time of interpretation on 09/09/2018 at 8:17 pm to Dr. Varney Biles , who verbally acknowledged these results. Aortic Atherosclerosis (ICD10-I70.0). Emphysema (ICD10-J43.9). Electronically Signed   By: Elon Alas M.D.   On: 09/09/2018 20:33   Ct Head Code Stroke Wo  Contrast  Result Date: 09/09/2018 CLINICAL DATA:  Code stroke. RIGHT-sided weakness and aphasia. History of bladder cancer and memory loss. EXAM: CT ANGIOGRAPHY HEAD AND NECK CT PERFUSION BRAIN TECHNIQUE: Multidetector CT imaging of the head and neck was performed using the standard protocol during bolus administration of intravenous contrast. Multiplanar CT image reconstructions and MIPs were obtained to evaluate the vascular anatomy. Carotid stenosis measurements (when applicable) are obtained utilizing NASCET criteria, using the distal internal carotid diameter as the denominator. Multiphase CT imaging of the brain was performed following IV bolus contrast injection. Subsequent parametric perfusion maps were calculated using RAPID software. CONTRAST:  135mL ISOVUE-370 IOPAMIDOL (ISOVUE-370) INJECTION 76% COMPARISON:  CT HEAD February 07, 2010 FINDINGS: CT HEAD FINDINGS BRAIN: No intraparenchymal hemorrhage, mass effect nor midline shift. No parenchymal brain volume loss for age. No hydrocephalus. Patchy supratentorial white matter hypodensities less than expected for patient's age, though non-specific are most compatible with chronic small vessel ischemic disease. No acute large  vascular territory infarcts. Streak artifact through middle cranial fossa limited assessment. No abnormal extra-axial fluid collections. Basal cisterns are patent. VASCULAR: Trace calcific atherosclerosis of the carotid siphons. SKULL: No skull fracture. No significant scalp soft tissue swelling. SINUSES/ORBITS: Trace paranasal sinus mucosal thickening. Mastoid air cells are well aerated.The included ocular globes and orbital contents are non-suspicious. Status post bilateral ocular lens implants. OTHER: Patient is edentulous. ASPECTS ([place] Stroke Program Early CT Score) - Ganglionic level infarction (caudate, lentiform nuclei, internal capsule, insula, M1-M3 cortex): 7 - Supraganglionic infarction (M4-M6 cortex): 3 Total score (0-10 with 10 being normal): 10 CTA NECK FINDINGS: AORTIC ARCH: Normal appearance of the thoracic arch, normal branch pattern. Moderate calcific atherosclerosis aortic arch. The origins of the innominate, left Common carotid artery and subclavian artery are patent. 2.8 segment occluded RIGHT subclavian artery origin with reconstitution. RIGHT CAROTID SYSTEM: Common carotid artery is patent. Mild calcific atherosclerosis of the carotid bifurcation without hemodynamically significant stenosis by NASCET criteria. Patent internal carotid artery, tonsillar loop. LEFT CAROTID SYSTEM: Common carotid artery is patent, mild atherosclerosis. Normal appearance of the carotid bifurcation without hemodynamically significant stenosis by NASCET criteria. Patent internal carotid artery, tonsillar loop. VERTEBRAL ARTERIES:Left vertebral artery is dominant. Normal appearance of the vertebral arteries, widely patent. SKELETON: No acute osseous process though bone windows have not been submitted. Multiple thyroid nodules without dominant thyroid. No routine indicated follow-up. OTHER NECK: Soft tissues of the neck are nonacute though, not tailored for evaluation. Debris within hypopharynx. UPPER CHEST:  Included lung apices are clear. Mild centrilobular emphysema. No superior mediastinal lymphadenopathy. CTA HEAD FINDINGS: ANTERIOR CIRCULATION: Patent cervical internal carotid arteries, petrous, cavernous internal carotid arteries. Sharp like defect LEFT paraclinoid ICA, occluded LEFT ICA carotid terminus. Distal patent anterior communicating artery. Patent anterior and middle cerebral arteries. No flow-limiting stenosis, contrast extravasation or aneurysm. POSTERIOR CIRCULATION: Patent vertebral arteries, vertebrobasilar junction and basilar artery, as well as main branch vessels. RIGHT vertebral artery predominantly terminates in the posterior inferior cerebellar artery. Severe stenosis RIGHT V4 segment. Patent posterior cerebral arteries. No large vessel occlusion, contrast extravasation or aneurysm. VENOUS SINUSES: Major dural venous sinuses are patent though not tailored for evaluation on this angiographic examination. ANATOMIC VARIANTS: None. DELAYED PHASE: Not performed. MIP images reviewed. CT Brain Perfusion Findings: CBF (<30%) Volume: 45mL Perfusion (Tmax>6.0s) volume: 38mL Mismatch Volume: 377mL Infarction Location:Infant at; severely motion degraded examination with LEFT > RIGHT confluent areas of penumbra most compatible with artifact. IMPRESSION: CT HEAD: 1. Negative non-contrast CT HEAD for age. CTA NECK: 1. No  hemodynamically significant stenosis ICA's. Patent vertebral arteries. 2. 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 3. Patent vertebral arteries. Debris within hypopharynx, aspiration risk. CTA HEAD: 1. Emergent LEFT carotid terminus thromboembolism; less likely dissection. Patent cerebral arteries. 2. Severe stenosis RIGHT V4 segment. CT PERFUSION: 1. Nondiagnostic examination due to motion. Critical Value/emergent results were called by telephone at the time of interpretation on 09/09/2018 at 8:17 pm to Dr. Varney Biles , who verbally acknowledged these results.  Aortic Atherosclerosis (ICD10-I70.0). Emphysema (ICD10-J43.9). Electronically Signed   By: Elon Alas M.D.   On: 09/09/2018 20:33    Procedures .Critical Care Performed by: Varney Biles, MD Authorized by: Varney Biles, MD   Critical care provider statement:    Critical care time (minutes):  52   Critical care was necessary to treat or prevent imminent or life-threatening deterioration of the following conditions:  CNS failure or compromise   Critical care was time spent personally by me on the following activities:  Discussions with consultants, evaluation of patient's response to treatment, examination of patient, ordering and performing treatments and interventions, ordering and review of laboratory studies, ordering and review of radiographic studies, pulse oximetry, re-evaluation of patient's condition, obtaining history from patient or surrogate and review of old charts   (including critical care time)  Medications Ordered in ED Medications  alteplase (ACTIVASE) 1 mg/mL infusion 63.3 mg (63.3 mg Intravenous Transfusing/Transfer 09/09/18 2117)    Followed by  0.9 %  sodium chloride infusion (has no administration in time range)   stroke: mapping our early stages of recovery book (has no administration in time range)  0.9 %  sodium chloride infusion (has no administration in time range)  acetaminophen (TYLENOL) tablet 650 mg (has no administration in time range)    Or  acetaminophen (TYLENOL) solution 650 mg (has no administration in time range)    Or  acetaminophen (TYLENOL) suppository 650 mg (has no administration in time range)  labetalol (NORMODYNE,TRANDATE) injection 10 mg (has no administration in time range)    And  nicardipine (CARDENE) 20mg  in 0.86% saline 281ml IV infusion (0.1 mg/ml) (has no administration in time range)  pantoprazole (PROTONIX) injection 40 mg (has no administration in time range)  iopamidol (ISOVUE-370) 76 % injection 150 mL (115 mLs  Intravenous Contrast Given 09/09/18 1939)     Initial Impression / Assessment and Plan / ED Course  I have reviewed the triage vital signs and the nursing notes.  Pertinent labs & imaging results that were available during my care of the patient were reviewed by me and considered in my medical decision making (see chart for details).  Clinical Course as of Sep 09 2239  Mon Sep 09, 2018  1950 I called the nursing home facility [facility].  They reported that patient is only on as needed Tylenol.  He has not been taking Xarelto for a while now.  No recent trauma, no known history of brain bleed.  Does not have power of attorney, his POA is listed as DSS.  I called DSS at [phone] extension [phone].  I received a answering machine on [employer].  We will call again.  I did not leave an answering machine voicemail.   [AN]  1959 No one at the DSS line is picking up the phone nor is there any emergency answering service.  I called back the extension for Ms. Ross and left a message. Telemetry neurology has been updated on patient's POA.  CT head does not reveal  any evidence of brain bleed.   [AN]    Clinical Course User Index [AN] Varney Biles, MD      85 year old male comes in with chief complaint of right-sided weakness, slurred speech.  He has history of A. fib, CAD, hypertension.  It appears that he used to be on Xarelto, however at this time he is not on any DOAC according to the nursing home team.  Clinical concerns are for stroke.  Pacifically we are concerned for LVO given the weakness on the right side along with speech abnormality.  LVO imaging has been ordered.  Telemetry neurology has been consulted.  Patient is well within the window for TPA and thrombectomy if he is a candidate.  Reassessment: Patient received TPA after telemetry neuro evaluation. Patient has a left MCA stroke.  Neurology excepting at [facility].  Final Clinical Impressions(s) / ED Diagnoses   Final  diagnoses:  Ischemic stroke [facility])    ED Discharge Orders    None       Varney Biles, MD 09/09/18 2241

## 2018-09-09 NOTE — Progress Notes (Signed)
CALL TIME = 1924 BEEPER TIME 1927 EXAM STARTED 1939 EXAM FINISHED 1943 IMAGES SENT TO soc 1946 EXAM COMPLETED IN Epic 1946 [place]

## 2018-09-09 NOTE — ED Notes (Addendum)
Pt is ward of state. Contact DSS Supervisor Glennie Hawk [phone] if you have any questions.

## 2018-09-09 NOTE — Progress Notes (Signed)
CALL TIME = 1924 BEEPER TIME 1927 EXAM STARTED 1939 EXAM FINISHED 1943 IMAGES SENT TO soc 1946 EXAM COMPLETED IN Epic 1946 [place]

## 2018-09-09 NOTE — ED Triage Notes (Signed)
Pt arrived via [facility] from [facility] for code stroke. LKW 1850. Pt presents with Aphasia and right side weakness.

## 2018-09-09 NOTE — ED Notes (Signed)
B[place]ard paged @ 57.

## 2018-09-09 NOTE — Consult Note (Signed)
[facility] TeleNeurology Consult Services   Date of Service:   09/09/2018 19:42:26  Impression:     .  Large Vessel Infarct     .  Left Hemispheric Infarct     .  MCA Distribution Infarct  Comments/Sign-Out: Patient with LMCA syndrome. tPA is indicated - patient's POA is DSS, and contact number went to a VM - message left. Dr. Kathrynn Humble and myself felt the benefits of tPA outweighed the risks, so tPA administration pursued. CTA showed carotid terminus occlusion with cross filling into the LMCA from [employer]. Perfusion study bolus was suboptimal, but definite increase in MTT and no obvious matched defect on CBV/CBF. Case reviewed with team at [facility] and patient will be transferred for potential thrombectomy.  Mechanism of Stroke: Possible Cardioembolic  Metrics: Last Known Well: 09/09/2018 18:50:00 [facility] Notification Time: 09/09/2018 19:42:26 Arrival Time: 09/09/2018 19:30:00 Stamp Time: 09/09/2018 19:42:26 Time First Login Attempt: 09/09/2018 19:44:00 Video Start Time: 09/09/2018 19:44:00  Symptoms: right-sided weakness NIHSS Start Assessment Time: 09/09/2018 20:05:00 tPA Verbal Order Time: 09/09/2018 20:06:00 Patient is a candidate for tPA. tPA CPOE Order Time: 09/09/2018 20:13:00 Needle Time: 09/09/2018 20:24:00 Weight Noted by Staff: 70.3 kg Video End Time: 09/09/2018 20:25:00  CT head showed no acute hemorrhage or acute core infarct.  Clinical Presentation is Suggestive of Large Vessel Occlusive Disease, Recommendations are as Follows  Advanced Imaging is Suggestive of Large Vessel Occlusion, Neurointerventional Specialist to be Consulted.   ED Physician notified of diagnostic impression and management plan on 09/09/2018 20:25:00  Verbal Consent to tPA: Dr. Kathrynn Humble and I discussed the nature of the patient's condition, the use of tPA fibrinolytic agent, and the benefits to be reasonably expected compared with alternative approaches. I  have discussed the likelihood of major risks or complications of this procedure including (if applicable) but not limited to loss of limb function, brain damage, paralysis, hemorrhage, infection, complications from transfusion of blood components, drug reactions, blood clots and loss of life. I have also indicated that with any procedure there is always the possibility of an unexpected complication. After the above noted review, Dr. Kathrynn Humble and I felt the benefits of tPA outweighed the risks.  Our recommendations are outlined below.  Recommendations: IV tPA recommended.  tPA bolus given Without Complication.   IV tPA Total Dose - 63.3 mg IV tPA Bolus Dose - 6.3 mg IV tPA Infusion Dose - 57.0 mg  Routine post tPA monitoring including neuro checks and blood pressure control during/after treatment Monitor blood pressure Check blood pressure and NIHSS every 15 min for 2 h, then every 30 min for 6 h, and finally every hour for 16 h.  Manage Blood Pressure per post tPA protocol.      .  Admission to ICU     .  CT brain 24 hours post tPA     .  NPO until swallowing screen performed and passed     .  No antiplatelet agents or anticoagulants (including heparin for DVT prophylaxis) in first 24 hours     .  No Foley catheter, nasogastric tube, arterial catheter or central venous catheter for 24 hr, unless absolutely necessary     .  Telemetry     .  Bedside swallow evaluation     .  HOB less than 30 degrees     .  Euglycemia     .  Avoid hyperthermia, PRN acetaminophen     .  DVT prophylaxis     .  Inpatient Neurology  Consultation     .  Stroke evaluation as per inpatient neurology recommendations  Discussed with ED physician    ------------------------------------------------------------------------------  History of Present Illness: Patient is a 85 year old Male.  Patient was brought by EMS for symptoms of right-sided weakness  Pt with a history of dementia and afib. He is not on  any AC as per his SNF - only taking APAP PRN now and no other medications. SNF staff last saw him normal at 1850 after dinner. Thereafter he was found with right-sided weakness and aphasia.  CT head showed no acute hemorrhage or acute core infarct.    Examination: BP(127/67), Blood Glucose(136) 1A: Level of Consciousness - Alert; keenly responsive + 0 1B: Ask Month and Age - Aphasic + 2 1C: Blink Eyes & Squeeze Hands - Performs 0 Tasks + 2 2: Test Horizontal Extraocular Movements - Forced Gaze Palsy: Cannot Be Overcome + 2 3: Test Visual Fields - No Visual Loss + 0 4: Test Facial Palsy (Use Grimace if Obtunded) - Partial paralysis (lower face) + 2 5A: Test Left Arm Motor Drift - Drift, but doesn't hit bed + 1 5B: Test Right Arm Motor Drift - No Movement + 4 6A: Test Left Leg Motor Drift - No Effort Against Gravity + 3 6B: Test Right Leg Motor Drift - No Movement + 4 7: Test Limb Ataxia (FNF/Heel-Shin) - No Ataxia + 0 8: Test Sensation - Mild-Moderate Loss: Less Sharp/More Dull + 1 9: Test Language/Aphasia - Mute/Global Aphasia: No Usable Speech/Auditory Comprehension + 3 10: Test Dysarthria - Mute/Anarthric + 2 11: Test Extinction/Inattention - No abnormality + 0  NIHSS Score: 26  Patient was informed the Neurology Consult would happen via TeleHealth consult by way of interactive audio and video telecommunications and consented to receiving care in this manner.  Due to the immediate potential for life-threatening deterioration due to underlying acute neurologic illness, I spent 35 minutes providing critical care. This time includes time for face to face visit via telemedicine, review of medical records, imaging studies and discussion of findings with providers, the patient and/or family.   Dr Arne Cleveland   [facility] [phone]  Case [id number]

## 2018-09-10 ENCOUNTER — Inpatient Hospital Stay (HOSPITAL_COMMUNITY): Payer: Medicare HMO

## 2018-09-10 ENCOUNTER — Other Ambulatory Visit (HOSPITAL_COMMUNITY): Payer: Medicare HMO

## 2018-09-10 DIAGNOSIS — D181 Lymphangioma, any site: Secondary | ICD-10-CM

## 2018-09-10 DIAGNOSIS — I361 Nonrheumatic tricuspid (valve) insufficiency: Secondary | ICD-10-CM

## 2018-09-10 DIAGNOSIS — I340 Nonrheumatic mitral (valve) insufficiency: Secondary | ICD-10-CM

## 2018-09-10 DIAGNOSIS — I63032 Cerebral infarction due to thrombosis of left carotid artery: Secondary | ICD-10-CM

## 2018-09-10 LAB — ECHOCARDIOGRAM COMPLETE
Height: 70 in
Weight: 2480 oz

## 2018-09-10 LAB — MRSA PCR SCREENING: MRSA by PCR: NEGATIVE

## 2018-09-10 MED ORDER — ASPIRIN 325 MG PO TABS: 325.0000 mg | ORAL_TABLET | Freq: Every day | ORAL | Status: DC

## 2018-09-10 MED ORDER — ASPIRIN 325 MG PO TABS
325.0000 mg | ORAL_TABLET | Freq: Every day | ORAL | Status: DC
  Administered 2018-09-10: 325 mg via ORAL
  Filled 2018-09-10: qty 1

## 2018-09-10 NOTE — Progress Notes (Signed)
PT Cancellation Note  Patient Details Name: [name] MRN: [id number] DOB: 02-17-1930   Cancelled Treatment:    Reason Eval/Treat Not Completed: Active bedrest order   Sandy Salaam Hollynn Garno 09/10/2018, 6:57 AM  Elwyn Reach, PT Acute Rehabilitation Services Pager: [phone] Office: [phone]

## 2018-09-10 NOTE — Evaluation (Addendum)
Clinical/Bedside Swallow Evaluation Patient Details  Name: [name] MRN: [id number] Date of Birth: 04-03-1930  Today's Date: 09/10/2018 Time: SLP Start Time (ACUTE ONLY): 0910 SLP Stop Time (ACUTE ONLY): 0928 SLP Time Calculation (min) (ACUTE ONLY): 18 min  Past Medical History:  Past Medical History:  Diagnosis Date  . [name] deficiency 2011  . Bladder cancer ([place])   . CAD (coronary artery disease)   . Hx of colonic polyp   . Hyperlipidemia   . Memory loss 2011   Past Surgical History:  Past Surgical History:  Procedure Laterality Date  . CHOLECYSTECTOMY     HPI:  Pt is a 85 y.o. male with a history of atrial fibrillation. He was taken to [facility] secondary to right-sided weakness and IV TPA was administered. A CT angiogram revealed a carotid terminus occlusion with cross filling into the LMCA from [employer]. Pt was transferred to [facility] for  potential thrombectomy.    Assessment / Plan / Recommendation Clinical Impression  Pt denied a history of oropharyngeal dysphagia but stated that he sometimes takes his pills in applesauce. The oral mechanism exam was significant for right-sided labial weakness and lingual weakness with deviation towards the right and the pt presented with full dentures. Pt's oropharyngeal swallow mechanism appears to be within functional limits at this time. A regular texture diet with thin liquids is recommended at this time. Medications may be administered whole in applesauce. SLP will follow to ensure tolerance of the recommended diet.  SLP Visit Diagnosis: Cognitive communication deficit (R41.841)    Aspiration Risk  No limitations    Diet Recommendation Regular;Thin liquid   Liquid Administration via: Cup;Straw Medication Administration: Whole meds with puree Supervision: Staff to assist with self feeding Compensations: Small sips/bites;Slow rate Postural Changes: Seated upright at 90 degrees    Other  Recommendations Oral Care  Recommendations: Oral care BID   Follow up Recommendations None;24 hour supervision/assistance      Frequency and Duration min 1 x/week  1 week       Prognosis Prognosis for Safe Diet Advancement: Good      Swallow Study   General Date of Onset: 09/09/18 HPI: Pt is a 85 y.o. male with a history of atrial fibrillation. He was taken to [facility] secondary to right-sided weakness and IV TPA was administered. A CT angiogram revealed a carotid terminus occlusion with cross filling into the LMCA from [employer]. Pt was transferred to [facility] for  potential thrombectomy.  Type of Study: Bedside Swallow Evaluation Previous Swallow Assessment: None Diet Prior to this Study: NPO Temperature Spikes Noted: No Respiratory Status: Room air History of Recent Intubation: No Behavior/Cognition: Alert;Cooperative;Pleasant mood Oral Cavity Assessment: Within Functional Limits Oral Care Completed by SLP: No Oral Cavity - Dentition: Dentures, top;Dentures, bottom Vision: Functional for self-feeding Self-Feeding Abilities: Able to feed self Patient Positioning: Upright in bed;Postural control adequate for testing Baseline Vocal Quality: Normal Volitional Cough: Strong Volitional Swallow: Able to elicit    Oral/Motor/Sensory Function Overall Oral Motor/Sensory Function: Mild impairment Facial ROM: Reduced right Facial Symmetry: Abnormal symmetry right;Suspected CN VII (facial) dysfunction Facial Strength: Reduced right;Suspected CN VII (facial) dysfunction Facial Sensation: Within Functional Limits Lingual ROM: Reduced right;Suspected CN XII (hypoglossal) dysfunction Lingual Symmetry: Abnormal symmetry right;Suspected CN XII (hypoglossal) dysfunction Lingual Strength: Reduced Lingual Sensation: Within Functional Limits Velum: Within Functional Limits Mandible: Within Functional Limits   Ice Chips Ice chips: Within functional limits Presentation: Spoon   Thin Liquid Thin Liquid: Within  functional  limits Presentation: Cup;Spoon;Straw    Nectar Thick Nectar Thick Liquid: Not tested   Honey Thick Honey Thick Liquid: Not tested   Puree Puree: Within functional limits Presentation: Spoon   Solid     Solid: Within functional limits Presentation: [place] I. Hardin Negus, [place], [place] Office number [phone] Pager [phone]  Horton Marshall 09/10/2018,9:38 AM

## 2018-09-10 NOTE — Progress Notes (Signed)
Pt is a ward of the state. Contact names and number for his case workers are:  Glennie Hawk [phone]  Marinda Elk [phone] ext. [phone]

## 2018-09-10 NOTE — Progress Notes (Addendum)
STROKE TEAM PROGRESS NOTE   INTERVAL HISTORY Patient has baseline dementia presented with right-sided weakness CT Angio showed terminal left ICA occlusion but patient was not felt to be a candidate for intervention due to his poor functional baseline. He has remained neurologically stable. Blood pressure adequately controlled.he received IV tPA and has shown improvement   Vitals:   09/10/18 0700 09/10/18 0730 09/10/18 0800 09/10/18 0900  BP: (!) 108/54 121/82 124/65 122/68  Pulse: (!) 57 65 78 68  Resp: 15 15 16 12   Temp:   98.4 F (36.9 C)   TempSrc:   Oral   SpO2: 97% 99% 98% 97%  Weight:      Height:        CBC:  Recent Labs  Lab 09/09/18 1935  WBC 4.8  NEUTROABS 3.0  HGB 12.2*  HCT 37.7*  MCV 98.2  PLT 146*    Basic Metabolic Panel:  Recent Labs  Lab 09/09/18 1935  NA 140  K 3.6  CL 106  CO2 29  GLUCOSE 128*  BUN 17  CREATININE 1.18  CALCIUM 9.0   Lipid Panel:     Component Value Date/Time   CHOL 136 01/27/2010 1221   TRIG 85.0 01/27/2010 1221   HDL 37.50 (L) 01/27/2010 1221   CHOLHDL 4 01/27/2010 1221   VLDL 17.0 01/27/2010 1221   LDLCALC 82 01/27/2010 1221   HgbA1c: No results found for: HGBA1C Urine Drug Screen: No results found for: LABOPIA, COCAINSCRNUR, LABBENZ, AMPHETMU, THCU, LABBARB  Alcohol Level     Component Value Date/Time   ETH <10 09/09/2018 1935    IMAGING Ct Angio Head W Or Wo Contrast  Result Date: 09/09/2018 CLINICAL DATA:  Code stroke. RIGHT-sided weakness and aphasia. History of bladder cancer and memory loss. EXAM: CT ANGIOGRAPHY HEAD AND NECK CT PERFUSION BRAIN TECHNIQUE: Multidetector CT imaging of the head and neck was performed using the standard protocol during bolus administration of intravenous contrast. Multiplanar CT image reconstructions and MIPs were obtained to evaluate the vascular anatomy. Carotid stenosis measurements (when applicable) are obtained utilizing NASCET criteria, using the distal internal carotid  diameter as the denominator. Multiphase CT imaging of the brain was performed following IV bolus contrast injection. Subsequent parametric perfusion maps were calculated using RAPID software. CONTRAST:  175mL ISOVUE-370 IOPAMIDOL (ISOVUE-370) INJECTION 76% COMPARISON:  CT HEAD February 07, 2010 FINDINGS: CT HEAD FINDINGS BRAIN: No intraparenchymal hemorrhage, mass effect nor midline shift. No parenchymal brain volume loss for age. No hydrocephalus. Patchy supratentorial white matter hypodensities less than expected for patient's age, though non-specific are most compatible with chronic small vessel ischemic disease. No acute large vascular territory infarcts. Streak artifact through middle cranial fossa limited assessment. No abnormal extra-axial fluid collections. Basal cisterns are patent. VASCULAR: Trace calcific atherosclerosis of the carotid siphons. SKULL: No skull fracture. No significant scalp soft tissue swelling. SINUSES/ORBITS: Trace paranasal sinus mucosal thickening. Mastoid air cells are well aerated.The included ocular globes and orbital contents are non-suspicious. Status post bilateral ocular lens implants. OTHER: Patient is edentulous. ASPECTS ([place] Stroke Program Early CT Score) - Ganglionic level infarction (caudate, lentiform nuclei, internal capsule, insula, M1-M3 cortex): 7 - Supraganglionic infarction (M4-M6 cortex): 3 Total score (0-10 with 10 being normal): 10 CTA NECK FINDINGS: AORTIC ARCH: Normal appearance of the thoracic arch, normal branch pattern. Moderate calcific atherosclerosis aortic arch. The origins of the innominate, left Common carotid artery and subclavian artery are patent. 2.8 segment occluded RIGHT subclavian artery origin with reconstitution. RIGHT CAROTID SYSTEM: Common carotid  artery is patent. Mild calcific atherosclerosis of the carotid bifurcation without hemodynamically significant stenosis by NASCET criteria. Patent internal carotid artery, tonsillar loop. LEFT  CAROTID SYSTEM: Common carotid artery is patent, mild atherosclerosis. Normal appearance of the carotid bifurcation without hemodynamically significant stenosis by NASCET criteria. Patent internal carotid artery, tonsillar loop. VERTEBRAL ARTERIES:Left vertebral artery is dominant. Normal appearance of the vertebral arteries, widely patent. SKELETON: No acute osseous process though bone windows have not been submitted. Multiple thyroid nodules without dominant thyroid. No routine indicated follow-up. OTHER NECK: Soft tissues of the neck are nonacute though, not tailored for evaluation. Debris within hypopharynx. UPPER CHEST: Included lung apices are clear. Mild centrilobular emphysema. No superior mediastinal lymphadenopathy. CTA HEAD FINDINGS: ANTERIOR CIRCULATION: Patent cervical internal carotid arteries, petrous, cavernous internal carotid arteries. Sharp like defect LEFT paraclinoid ICA, occluded LEFT ICA carotid terminus. Distal patent anterior communicating artery. Patent anterior and middle cerebral arteries. No flow-limiting stenosis, contrast extravasation or aneurysm. POSTERIOR CIRCULATION: Patent vertebral arteries, vertebrobasilar junction and basilar artery, as well as main branch vessels. RIGHT vertebral artery predominantly terminates in the posterior inferior cerebellar artery. Severe stenosis RIGHT V4 segment. Patent posterior cerebral arteries. No large vessel occlusion, contrast extravasation or aneurysm. VENOUS SINUSES: Major dural venous sinuses are patent though not tailored for evaluation on this angiographic examination. ANATOMIC VARIANTS: None. DELAYED PHASE: Not performed. MIP images reviewed. CT Brain Perfusion Findings: CBF (<30%) Volume: 44mL Perfusion (Tmax>6.0s) volume: 337mL Mismatch Volume: 396mL Infarction Location:Infant at; severely motion degraded examination with LEFT > RIGHT confluent areas of penumbra most compatible with artifact. IMPRESSION: CT HEAD: 1. Negative non-contrast  CT HEAD for age. CTA NECK: 1. No hemodynamically significant stenosis ICA's. Patent vertebral arteries. 2. 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 3. Patent vertebral arteries. Debris within hypopharynx, aspiration risk. CTA HEAD: 1. Emergent LEFT carotid terminus thromboembolism; less likely dissection. Patent cerebral arteries. 2. Severe stenosis RIGHT V4 segment. CT PERFUSION: 1. Nondiagnostic examination due to motion. Critical Value/emergent results were called by telephone at the time of interpretation on 09/09/2018 at 8:17 pm to Dr. Varney Biles , who verbally acknowledged these results. Aortic Atherosclerosis (ICD10-I70.0). Emphysema (ICD10-J43.9). Electronically Signed   By: Elon Alas M.D.   On: 09/09/2018 20:33   Ct Angio Neck W Or Wo Contrast  Result Date: 09/09/2018 CLINICAL DATA:  Code stroke. RIGHT-sided weakness and aphasia. History of bladder cancer and memory loss. EXAM: CT ANGIOGRAPHY HEAD AND NECK CT PERFUSION BRAIN TECHNIQUE: Multidetector CT imaging of the head and neck was performed using the standard protocol during bolus administration of intravenous contrast. Multiplanar CT image reconstructions and MIPs were obtained to evaluate the vascular anatomy. Carotid stenosis measurements (when applicable) are obtained utilizing NASCET criteria, using the distal internal carotid diameter as the denominator. Multiphase CT imaging of the brain was performed following IV bolus contrast injection. Subsequent parametric perfusion maps were calculated using RAPID software. CONTRAST:  158mL ISOVUE-370 IOPAMIDOL (ISOVUE-370) INJECTION 76% COMPARISON:  CT HEAD February 07, 2010 FINDINGS: CT HEAD FINDINGS BRAIN: No intraparenchymal hemorrhage, mass effect nor midline shift. No parenchymal brain volume loss for age. No hydrocephalus. Patchy supratentorial white matter hypodensities less than expected for patient's age, though non-specific are most compatible with chronic  small vessel ischemic disease. No acute large vascular territory infarcts. Streak artifact through middle cranial fossa limited assessment. No abnormal extra-axial fluid collections. Basal cisterns are patent. VASCULAR: Trace calcific atherosclerosis of the carotid siphons. SKULL: No skull fracture. No significant scalp soft tissue swelling. SINUSES/ORBITS: Trace  paranasal sinus mucosal thickening. Mastoid air cells are well aerated.The included ocular globes and orbital contents are non-suspicious. Status post bilateral ocular lens implants. OTHER: Patient is edentulous. ASPECTS ([place] Stroke Program Early CT Score) - Ganglionic level infarction (caudate, lentiform nuclei, internal capsule, insula, M1-M3 cortex): 7 - Supraganglionic infarction (M4-M6 cortex): 3 Total score (0-10 with 10 being normal): 10 CTA NECK FINDINGS: AORTIC ARCH: Normal appearance of the thoracic arch, normal branch pattern. Moderate calcific atherosclerosis aortic arch. The origins of the innominate, left Common carotid artery and subclavian artery are patent. 2.8 segment occluded RIGHT subclavian artery origin with reconstitution. RIGHT CAROTID SYSTEM: Common carotid artery is patent. Mild calcific atherosclerosis of the carotid bifurcation without hemodynamically significant stenosis by NASCET criteria. Patent internal carotid artery, tonsillar loop. LEFT CAROTID SYSTEM: Common carotid artery is patent, mild atherosclerosis. Normal appearance of the carotid bifurcation without hemodynamically significant stenosis by NASCET criteria. Patent internal carotid artery, tonsillar loop. VERTEBRAL ARTERIES:Left vertebral artery is dominant. Normal appearance of the vertebral arteries, widely patent. SKELETON: No acute osseous process though bone windows have not been submitted. Multiple thyroid nodules without dominant thyroid. No routine indicated follow-up. OTHER NECK: Soft tissues of the neck are nonacute though, not tailored for evaluation.  Debris within hypopharynx. UPPER CHEST: Included lung apices are clear. Mild centrilobular emphysema. No superior mediastinal lymphadenopathy. CTA HEAD FINDINGS: ANTERIOR CIRCULATION: Patent cervical internal carotid arteries, petrous, cavernous internal carotid arteries. Sharp like defect LEFT paraclinoid ICA, occluded LEFT ICA carotid terminus. Distal patent anterior communicating artery. Patent anterior and middle cerebral arteries. No flow-limiting stenosis, contrast extravasation or aneurysm. POSTERIOR CIRCULATION: Patent vertebral arteries, vertebrobasilar junction and basilar artery, as well as main branch vessels. RIGHT vertebral artery predominantly terminates in the posterior inferior cerebellar artery. Severe stenosis RIGHT V4 segment. Patent posterior cerebral arteries. No large vessel occlusion, contrast extravasation or aneurysm. VENOUS SINUSES: Major dural venous sinuses are patent though not tailored for evaluation on this angiographic examination. ANATOMIC VARIANTS: None. DELAYED PHASE: Not performed. MIP images reviewed. CT Brain Perfusion Findings: CBF (<30%) Volume: 23mL Perfusion (Tmax>6.0s) volume: 376mL Mismatch Volume: 368mL Infarction Location:Infant at; severely motion degraded examination with LEFT > RIGHT confluent areas of penumbra most compatible with artifact. IMPRESSION: CT HEAD: 1. Negative non-contrast CT HEAD for age. CTA NECK: 1. No hemodynamically significant stenosis ICA's. Patent vertebral arteries. 2. 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 3. Patent vertebral arteries. Debris within hypopharynx, aspiration risk. CTA HEAD: 1. Emergent LEFT carotid terminus thromboembolism; less likely dissection. Patent cerebral arteries. 2. Severe stenosis RIGHT V4 segment. CT PERFUSION: 1. Nondiagnostic examination due to motion. Critical Value/emergent results were called by telephone at the time of interpretation on 09/09/2018 at 8:17 pm to Dr. Varney Biles ,  who verbally acknowledged these results. Aortic Atherosclerosis (ICD10-I70.0). Emphysema (ICD10-J43.9). Electronically Signed   By: Elon Alas M.D.   On: 09/09/2018 20:33   Ct Cerebral Perfusion W Contrast  Result Date: 09/09/2018 CLINICAL DATA:  Code stroke. RIGHT-sided weakness and aphasia. History of bladder cancer and memory loss. EXAM: CT ANGIOGRAPHY HEAD AND NECK CT PERFUSION BRAIN TECHNIQUE: Multidetector CT imaging of the head and neck was performed using the standard protocol during bolus administration of intravenous contrast. Multiplanar CT image reconstructions and MIPs were obtained to evaluate the vascular anatomy. Carotid stenosis measurements (when applicable) are obtained utilizing NASCET criteria, using the distal internal carotid diameter as the denominator. Multiphase CT imaging of the brain was performed following IV bolus contrast injection. Subsequent parametric perfusion maps were  calculated using RAPID software. CONTRAST:  169mL ISOVUE-370 IOPAMIDOL (ISOVUE-370) INJECTION 76% COMPARISON:  CT HEAD February 07, 2010 FINDINGS: CT HEAD FINDINGS BRAIN: No intraparenchymal hemorrhage, mass effect nor midline shift. No parenchymal brain volume loss for age. No hydrocephalus. Patchy supratentorial white matter hypodensities less than expected for patient's age, though non-specific are most compatible with chronic small vessel ischemic disease. No acute large vascular territory infarcts. Streak artifact through middle cranial fossa limited assessment. No abnormal extra-axial fluid collections. Basal cisterns are patent. VASCULAR: Trace calcific atherosclerosis of the carotid siphons. SKULL: No skull fracture. No significant scalp soft tissue swelling. SINUSES/ORBITS: Trace paranasal sinus mucosal thickening. Mastoid air cells are well aerated.The included ocular globes and orbital contents are non-suspicious. Status post bilateral ocular lens implants. OTHER: Patient is edentulous. ASPECTS  ([place] Stroke Program Early CT Score) - Ganglionic level infarction (caudate, lentiform nuclei, internal capsule, insula, M1-M3 cortex): 7 - Supraganglionic infarction (M4-M6 cortex): 3 Total score (0-10 with 10 being normal): 10 CTA NECK FINDINGS: AORTIC ARCH: Normal appearance of the thoracic arch, normal branch pattern. Moderate calcific atherosclerosis aortic arch. The origins of the innominate, left Common carotid artery and subclavian artery are patent. 2.8 segment occluded RIGHT subclavian artery origin with reconstitution. RIGHT CAROTID SYSTEM: Common carotid artery is patent. Mild calcific atherosclerosis of the carotid bifurcation without hemodynamically significant stenosis by NASCET criteria. Patent internal carotid artery, tonsillar loop. LEFT CAROTID SYSTEM: Common carotid artery is patent, mild atherosclerosis. Normal appearance of the carotid bifurcation without hemodynamically significant stenosis by NASCET criteria. Patent internal carotid artery, tonsillar loop. VERTEBRAL ARTERIES:Left vertebral artery is dominant. Normal appearance of the vertebral arteries, widely patent. SKELETON: No acute osseous process though bone windows have not been submitted. Multiple thyroid nodules without dominant thyroid. No routine indicated follow-up. OTHER NECK: Soft tissues of the neck are nonacute though, not tailored for evaluation. Debris within hypopharynx. UPPER CHEST: Included lung apices are clear. Mild centrilobular emphysema. No superior mediastinal lymphadenopathy. CTA HEAD FINDINGS: ANTERIOR CIRCULATION: Patent cervical internal carotid arteries, petrous, cavernous internal carotid arteries. Sharp like defect LEFT paraclinoid ICA, occluded LEFT ICA carotid terminus. Distal patent anterior communicating artery. Patent anterior and middle cerebral arteries. No flow-limiting stenosis, contrast extravasation or aneurysm. POSTERIOR CIRCULATION: Patent vertebral arteries, vertebrobasilar junction and  basilar artery, as well as main branch vessels. RIGHT vertebral artery predominantly terminates in the posterior inferior cerebellar artery. Severe stenosis RIGHT V4 segment. Patent posterior cerebral arteries. No large vessel occlusion, contrast extravasation or aneurysm. VENOUS SINUSES: Major dural venous sinuses are patent though not tailored for evaluation on this angiographic examination. ANATOMIC VARIANTS: None. DELAYED PHASE: Not performed. MIP images reviewed. CT Brain Perfusion Findings: CBF (<30%) Volume: 16mL Perfusion (Tmax>6.0s) volume: 367mL Mismatch Volume: 374mL Infarction Location:Infant at; severely motion degraded examination with LEFT > RIGHT confluent areas of penumbra most compatible with artifact. IMPRESSION: CT HEAD: 1. Negative non-contrast CT HEAD for age. CTA NECK: 1. No hemodynamically significant stenosis ICA's. Patent vertebral arteries. 2. 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 3. Patent vertebral arteries. Debris within hypopharynx, aspiration risk. CTA HEAD: 1. Emergent LEFT carotid terminus thromboembolism; less likely dissection. Patent cerebral arteries. 2. Severe stenosis RIGHT V4 segment. CT PERFUSION: 1. Nondiagnostic examination due to motion. Critical Value/emergent results were called by telephone at the time of interpretation on 09/09/2018 at 8:17 pm to Dr. Varney Biles , who verbally acknowledged these results. Aortic Atherosclerosis (ICD10-I70.0). Emphysema (ICD10-J43.9). Electronically Signed   By: Elon Alas M.D.   On: 09/09/2018 20:33  Ct Head Code Stroke Wo Contrast  Result Date: 09/09/2018 CLINICAL DATA:  Code stroke. RIGHT-sided weakness and aphasia. History of bladder cancer and memory loss. EXAM: CT ANGIOGRAPHY HEAD AND NECK CT PERFUSION BRAIN TECHNIQUE: Multidetector CT imaging of the head and neck was performed using the standard protocol during bolus administration of intravenous contrast. Multiplanar CT image  reconstructions and MIPs were obtained to evaluate the vascular anatomy. Carotid stenosis measurements (when applicable) are obtained utilizing NASCET criteria, using the distal internal carotid diameter as the denominator. Multiphase CT imaging of the brain was performed following IV bolus contrast injection. Subsequent parametric perfusion maps were calculated using RAPID software. CONTRAST:  136mL ISOVUE-370 IOPAMIDOL (ISOVUE-370) INJECTION 76% COMPARISON:  CT HEAD February 07, 2010 FINDINGS: CT HEAD FINDINGS BRAIN: No intraparenchymal hemorrhage, mass effect nor midline shift. No parenchymal brain volume loss for age. No hydrocephalus. Patchy supratentorial white matter hypodensities less than expected for patient's age, though non-specific are most compatible with chronic small vessel ischemic disease. No acute large vascular territory infarcts. Streak artifact through middle cranial fossa limited assessment. No abnormal extra-axial fluid collections. Basal cisterns are patent. VASCULAR: Trace calcific atherosclerosis of the carotid siphons. SKULL: No skull fracture. No significant scalp soft tissue swelling. SINUSES/ORBITS: Trace paranasal sinus mucosal thickening. Mastoid air cells are well aerated.The included ocular globes and orbital contents are non-suspicious. Status post bilateral ocular lens implants. OTHER: Patient is edentulous. ASPECTS ([place] Stroke Program Early CT Score) - Ganglionic level infarction (caudate, lentiform nuclei, internal capsule, insula, M1-M3 cortex): 7 - Supraganglionic infarction (M4-M6 cortex): 3 Total score (0-10 with 10 being normal): 10 CTA NECK FINDINGS: AORTIC ARCH: Normal appearance of the thoracic arch, normal branch pattern. Moderate calcific atherosclerosis aortic arch. The origins of the innominate, left Common carotid artery and subclavian artery are patent. 2.8 segment occluded RIGHT subclavian artery origin with reconstitution. RIGHT CAROTID SYSTEM: Common carotid  artery is patent. Mild calcific atherosclerosis of the carotid bifurcation without hemodynamically significant stenosis by NASCET criteria. Patent internal carotid artery, tonsillar loop. LEFT CAROTID SYSTEM: Common carotid artery is patent, mild atherosclerosis. Normal appearance of the carotid bifurcation without hemodynamically significant stenosis by NASCET criteria. Patent internal carotid artery, tonsillar loop. VERTEBRAL ARTERIES:Left vertebral artery is dominant. Normal appearance of the vertebral arteries, widely patent. SKELETON: No acute osseous process though bone windows have not been submitted. Multiple thyroid nodules without dominant thyroid. No routine indicated follow-up. OTHER NECK: Soft tissues of the neck are nonacute though, not tailored for evaluation. Debris within hypopharynx. UPPER CHEST: Included lung apices are clear. Mild centrilobular emphysema. No superior mediastinal lymphadenopathy. CTA HEAD FINDINGS: ANTERIOR CIRCULATION: Patent cervical internal carotid arteries, petrous, cavernous internal carotid arteries. Sharp like defect LEFT paraclinoid ICA, occluded LEFT ICA carotid terminus. Distal patent anterior communicating artery. Patent anterior and middle cerebral arteries. No flow-limiting stenosis, contrast extravasation or aneurysm. POSTERIOR CIRCULATION: Patent vertebral arteries, vertebrobasilar junction and basilar artery, as well as main branch vessels. RIGHT vertebral artery predominantly terminates in the posterior inferior cerebellar artery. Severe stenosis RIGHT V4 segment. Patent posterior cerebral arteries. No large vessel occlusion, contrast extravasation or aneurysm. VENOUS SINUSES: Major dural venous sinuses are patent though not tailored for evaluation on this angiographic examination. ANATOMIC VARIANTS: None. DELAYED PHASE: Not performed. MIP images reviewed. CT Brain Perfusion Findings: CBF (<30%) Volume: 42mL Perfusion (Tmax>6.0s) volume: 321mL Mismatch Volume:  366mL Infarction Location:Infant at; severely motion degraded examination with LEFT > RIGHT confluent areas of penumbra most compatible with artifact. IMPRESSION: CT HEAD: 1. Negative non-contrast CT HEAD  for age. CTA NECK: 1. No hemodynamically significant stenosis ICA's. Patent vertebral arteries. 2. 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 3. Patent vertebral arteries. Debris within hypopharynx, aspiration risk. CTA HEAD: 1. Emergent LEFT carotid terminus thromboembolism; less likely dissection. Patent cerebral arteries. 2. Severe stenosis RIGHT V4 segment. CT PERFUSION: 1. Nondiagnostic examination due to motion. Critical Value/emergent results were called by telephone at the time of interpretation on 09/09/2018 at 8:17 pm to Dr. Varney Biles , who verbally acknowledged these results. Aortic Atherosclerosis (ICD10-I70.0). Emphysema (ICD10-J43.9). Electronically Signed   By: Elon Alas M.D.   On: 09/09/2018 20:33    PHYSICAL EXAM Pleasant elderly Caucasian male not in distress. . Afebrile. Head is nontraumatic. Neck is supple without bruit.    Cardiac exam no murmur or gallop. Lungs are clear to auscultation. Distal pulses are well felt. Neurological Exam :  Neurological Exam ;  Awake  Alert oriented to person only.. Normal speech and language.diminished attention, registration and recall.  Follows only simple midline and one-step commands.  Poor insight into his illness.  Eye movements full without nystagmus.fundi were not visualized. Vision acuity and fields appear normal. Hearing is normal. Palatal movements are normal. Face symmetric. Tongue midline. Mild right hemiparesis 4/5 strength with weakness of right grip and intrinsic hand muscles.  Orbits left over right upper extremity.  Mild weakness of right hip flexors and ankle dorsiflexors.  Normal sensation. Gait deferred.   ASSESSMENT/PLAN [name] is a 85 y.o. male with history of A. fib not on  anticoagulation x1 year, hygroma, significant dementia who was ambulatory and able to speak at baseline presenting from [facility] to [facility] with right-sided weakness.  Received [facility]PA at [facility] 09/09/2018 at 2047 and transferred to [facility].  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 ASPECTS 10.     CTA head ELVO L ICA. Severe R V4 stenosis   CTA neck no ELVO. R subclavian artery occlusion  CT perfusion non diagnostic  24h CT TINY LEFT PRECENTRAL GYRUS INFARCT.  Small vessel disease. Atrophy.   MRI  pending   2D Echo  pending   LDL pending   HgbA1c pending   SCDs for VTE prophylaxis  aspirin 81 mg daily prior to admission, ok to start aspirin 325 mg daily 24h post tPA  Therapy recommendations:  SNF  Disposition:  pending   Keep in ICU. Plan transfer to floor vs return to SNF tomorrow.  Atrial Fibrillation  Home anticoagulation:  none   Anticoagulation stopped 1 year ago.    Consider anticoagulation at discharge  Other Stroke Risk Factors  Advanced age  Former cigarette smoker, quit 99 years ago   Coronary artery disease  Other Active Problems  Baseline dementia  Hygroma  Hospital day # [place], MSN, APRN, ANVP-BC, AGPCNP-BC Advanced Practice Stroke Nurse [place] for Schedule & Pager information 09/10/2018 6:04 PM  I have personally obtained history,examined this patient, reviewed notes, independently viewed imaging studies, participated in medical decision making and plan of care.ROS completed by me personally and pertinent positives fully documented  I have made any additions or clarifications directly to the above note. Agree with note above.  He has baseline dementia but presented with right-sided weakness likely due to terminal left ICA occlusion.  He received IV TPA but was not considered for intervention  given his poor  neurological baseline.  He seems to have obtained significant improvement.  Continue strict blood pressure control and close neurological monitoring as per post TPA protocol.  Mobilize out of bed.  Therapy consults.  Aspirin after follow-up post TPA CT.  No family available at the bedside for discussion.This patient is critically ill and at significant risk of neurological worsening, death and care requires constant monitoring of vital signs, hemodynamics,respiratory and cardiac monitoring, extensive review of multiple databases, frequent neurological assessment, discussion with family, other specialists and medical decision making of high complexity.I have made any additions or clarifications directly to the above note.This critical care time does not reflect procedure time, or teaching time or supervisory time of PA/NP/Med Resident etc but could involve care discussion time.  I spent 30 minutes of neurocritical care time  in the care of  this patient.      Antony Contras, MD Medical Director [facility] Pager: [phone] 09/10/2018 6:30 PM  To contact Stroke Continuity provider, please refer to [email or web address] After hours, contact General Neurology

## 2018-09-10 NOTE — Evaluation (Signed)
Physical Therapy Evaluation Patient Details Name: [name] MRN: [id number] DOB: 06-08-1930 Today's Date: 09/10/2018   History of Present Illness  85 yo admitted from SNF with right hemiparesis s/p tPA with CT angiogram demonstrating distal carotid occlusion. PMhx: dementia, Afib, hygroma, CAD, HLD, bladder CA  Clinical Impression  Pt pleasant male in bed upon arrival with family present and agreed to participate with physical therapy. Pt appears to be at baseline cognition. Pt presents with decreased balance and functional mobility secondary to recent stroke. Pt would benefit from acute skilled therapy to improve functional mobility and independence. Pt educated on plan to follow acutely and consented.     Follow Up Recommendations SNF    Equipment Recommendations  None recommended by PT    Recommendations for Other Services OT consult     Precautions / Restrictions Precautions Precautions: Fall Restrictions Weight Bearing Restrictions: No      Mobility  Bed Mobility Overal bed mobility: Needs Assistance Bed Mobility: Supine to Sit     Supine to sit: Min guard;HOB elevated     General bed mobility comments: min guard for safety  Transfers Overall transfer level: Needs assistance Equipment used: Rolling walker (2 wheeled) Transfers: Sit to/from Stand Sit to Stand: Min guard         General transfer comment: verbal cues for hand placement, initially in standing pt reaching for IV pole for UE support so given RW   Ambulation/Gait Ambulation/Gait assistance: Min assist Gait Distance (Feet): 200 Feet Assistive device: Rolling walker (2 wheeled) Gait Pattern/deviations: Step-through pattern;Decreased stride length     General Gait Details: pt ambulating with RW however required min A to navigate with RW as he frequently picked up walker or doing wheelies with RW  Stairs            Wheelchair Mobility    Modified Rankin (Stroke Patients  Only) Modified Rankin (Stroke Patients Only) Pre-Morbid Rankin Score: Moderate disability Modified Rankin: Moderately severe disability     Balance Overall balance assessment: Mild deficits observed, not formally tested(one mild LOB during gait trying to lean into room door to push out of the way while having walker on one wheel )                                           Pertinent Vitals/Pain Pain Assessment: No/denies pain    Home Living Family/patient expects to be discharged to:: Skilled nursing facility     Type of Home: [place]                Prior Function Level of Independence: Needs assistance         Comments: independent with mobility in skilled nursing facility but required cuing for initiation      Hand Dominance        Extremity/Trunk Assessment   Upper Extremity Assessment Upper Extremity Assessment: Overall WFL for tasks assessed    Lower Extremity Assessment Lower Extremity Assessment: Overall WFL for tasks assessed       Communication   Communication: No difficulties  Cognition Arousal/Alertness: Awake/alert Behavior During Therapy: WFL for tasks assessed/performed Overall Cognitive Status: History of cognitive impairments - at baseline  General Comments      Exercises     Assessment/Plan    PT Assessment Patient needs continued PT services  PT Problem List Decreased strength;Decreased cognition;Decreased range of motion;Decreased activity tolerance;Decreased knowledge of use of DME;Decreased balance;Decreased safety awareness;Decreased mobility;Decreased knowledge of precautions       PT Treatment Interventions DME instruction;Therapeutic exercise;Gait training;Balance training;Stair training;Neuromuscular re-education;Functional mobility training;Cognitive remediation;Therapeutic activities;Patient/family education    PT Goals (Current  goals can be found in the Care Plan section)  Acute Rehab PT Goals Patient Stated Goal: return home PT Goal Formulation: With patient Time For Goal Achievement: 09/17/18 Potential to Achieve Goals: Good    Frequency Min 3X/week   Barriers to discharge        Co-evaluation               AM-PAC PT "6 Clicks" Mobility  Outcome Measure Help needed turning from your back to your side while in a flat bed without using bedrails?: A Little Help needed moving from lying on your back to sitting on the side of a flat bed without using bedrails?: A Little Help needed moving to and from a bed to a chair (including a wheelchair)?: A Little Help needed standing up from a chair using your arms (e.g., wheelchair or bedside chair)?: A Little Help needed to walk in hospital room?: A Little Help needed climbing 3-5 steps with a railing? : A Lot 6 Click Score: 17    End of Session Equipment Utilized During Treatment: Gait belt Activity Tolerance: Patient tolerated treatment well Patient left: in chair;with chair alarm set;with call bell/phone within reach Nurse Communication: Mobility status PT Visit Diagnosis: Unsteadiness on feet (R26.81);Other symptoms and signs involving the nervous system (R29.898);Difficulty in walking, not elsewhere classified (R26.2)    Time: 1212-1239 PT Time Calculation (min) (ACUTE ONLY): 27 min   Charges:   PT Evaluation $PT Eval Moderate Complexity: 1 Mod PT Treatments $Gait Training: 8-22 mins        [place], [place] [phone]   Charnika Herbst 09/10/2018, 1:42 PM

## 2018-09-10 NOTE — Evaluation (Signed)
Speech Language Pathology Evaluation Patient Details Name: [name] MRN: [id number] DOB: 1930-03-02 Today's Date: 09/10/2018 Time: 3790-2409 SLP Time Calculation (min) (ACUTE ONLY): 18 min  Problem List:  Patient Active Problem List   Diagnosis Date Noted  . Stroke (cerebrum) ([place]) 09/09/2018  . Chest pain 12/29/2013  . Atrial fibrillation ([place]) 12/29/2013  . VERTIGO 02/04/2010  . [name] DEFICIENCY 01/31/2010  . MEMORY LOSS 01/27/2010  . PARESTHESIA 01/27/2010  . HYPERLIPIDEMIA 11/02/2009  . CORONARY ARTERY DISEASE 11/02/2009  . SINUSITIS, ACUTE 11/02/2009  . EPISTAXIS 11/02/2009  . COLONIC POLYPS, HX OF 11/02/2009   Past Medical History:  Past Medical History:  Diagnosis Date  . [name] deficiency 2011  . Bladder cancer ([place])   . CAD (coronary artery disease)   . Hx of colonic polyp   . Hyperlipidemia   . Memory loss 2011   Past Surgical History:  Past Surgical History:  Procedure Laterality Date  . CHOLECYSTECTOMY     HPI:  Pt is a 85 y.o. male with a history of atrial fibrillation. He was taken to [facility] secondary to right-sided weakness and IV TPA was administered. A CT angiogram revealed a carotid terminus occlusion with cross filling into the LMCA from [employer]. Pt was transferred to [facility] for  potential thrombectomy.    Assessment / Plan / Recommendation Clinical Impression  Pt participated in speech/language evaluation and reported baseline difficulty with memory. His speech and language skills are currently within functional limits. His cognitive-linguistic deficits are likely his baseline and are within functional limits considering his needs in the skilled nursing facility. Further skilled SLP services are not clinically indicated at time for speech/language/cognition but SLP will follow for swallowing.     SLP Assessment  SLP Recommendation/Assessment: Patient does not need any further Speech Lanaguage Pathology Services(For  speech/language/cognition but will f/u for swallowing) SLP Visit Diagnosis: Cognitive communication deficit (R41.841)    Follow Up Recommendations  None;24 hour supervision/assistance    Frequency and Duration min 2x/week         SLP Evaluation Cognition  Overall Cognitive Status: History of cognitive impairments - at baseline Orientation Level: Oriented to place;Disoriented to time;Oriented to person;Disoriented to situation Attention: Focused;Sustained Focused Attention: Impaired Focused Attention Impairment: Verbal complex Sustained Attention: Impaired Sustained Attention Impairment: Verbal complex Memory: Impaired Memory Impairment: Retrieval deficit;Storage deficit;Decreased recall of new information Awareness: Impaired Awareness Impairment: Emergent impairment Problem Solving: Impaired Problem Solving Impairment: Verbal complex;Verbal basic;Functional basic Behaviors: Impulsive       Comprehension  Auditory Comprehension Overall Auditory Comprehension: Impaired Yes/No Questions: Within Functional Limits Commands: Impaired One Step Basic Commands: (4/4) Two Step Basic Commands: (3/4) Multistep Basic Commands: (2/4) Conversation: Simple Interfering Components: Attention;Working Field seismologist: Science writer Reading Comprehension Reading Status: Not tested    Expression Expression Primary Mode of Expression: Verbal Verbal Expression Overall Verbal Expression: Appears within functional limits for tasks assessed Initiation: No impairment Automatic Speech: Counting;Day of week;Month of year(WFL) Level of Generative/Spontaneous Verbalization: Sentence;Conversation Repetition: No impairment Naming: No impairment Pragmatics: No impairment Interfering Components: Attention   Oral / Motor  Oral Motor/Sensory Function Overall Oral Motor/Sensory Function: Mild impairment Facial ROM: Reduced right Facial Symmetry: Abnormal symmetry  right;Suspected CN VII (facial) dysfunction Facial Strength: Reduced right;Suspected CN VII (facial) dysfunction Facial Sensation: Within Functional Limits Lingual ROM: Reduced right;Suspected CN XII (hypoglossal) dysfunction Lingual Symmetry: Abnormal symmetry right;Suspected CN XII (hypoglossal) dysfunction Lingual Strength: Reduced Lingual Sensation: Within Functional Limits Velum: Within Functional Limits Mandible: Within Functional Limits Motor  Speech Overall Motor Speech: Appears within functional limits for tasks assessed Respiration: Within functional limits Phonation: Normal Resonance: Within functional limits Articulation: Within functional limitis Intelligibility: Intelligible Motor Planning: Witnin functional limits Motor Speech Errors: Not applicable   [name], [place], [place] Office number [phone] Pager [place] 09/10/2018, 9:35 AM

## 2018-09-10 NOTE — Progress Notes (Signed)
OT Cancellation Note  Patient Details Name: BESNIK FEBUS MRN: [id number] DOB: 17-Jun-1930   Cancelled Treatment:    Reason Eval/Treat Not Completed: Active bedrest order(Will return as schedule allows and pt medically ready.)  [place], OTR/L Acute Rehab Pager: [phone] Office: [phone] 09/10/2018, 8:31 AM

## 2018-09-10 NOTE — Evaluation (Addendum)
Occupational Therapy Evaluation Patient Details Name: [name] MRN: [id number] DOB: 09-Sep-1929 Today's Date: 09/10/2018    History of Present Illness 85 yo admitted from SNF with right hemiparesis s/p tPA with CT angiogram demonstrating distal carotid occlusion. PMhx: dementia, Afib, hygroma, CAD, HLD, bladder CA   Clinical Impression   PTA, pt was living at [facility] and requiring supervision/assistance for ADLs for safety. Pt currently requiring Min Guard A for UB ADLs, Min A for LB ADLs, and Min A for functional mobility with single hand held A. Pt appears to be at baseline cognition as confirmed by family. Pt presenting with decreased strength at RUE and poor balance with right lateral lean. Pt would benefit from further acute OT to facilitate safe dc. Recommend dc to SNF for further OT to optimize safety, independence with ADLs, and return to PLOF.       Follow Up Recommendations  SNF;Supervision/Assistance - 24 hour    Equipment Recommendations  None recommended by OT    Recommendations for Other Services PT consult;Speech consult     Precautions / Restrictions Precautions Precautions: Fall Restrictions Weight Bearing Restrictions: No      Mobility Bed Mobility Overal bed mobility: Needs Assistance Bed Mobility: Supine to Sit     Supine to sit: Min guard;HOB elevated     General bed mobility comments: min guard for safety  Transfers Overall transfer level: Needs assistance Equipment used: None Transfers: Sit to/from Stand Sit to Stand: Min guard;Min assist         General transfer comment: Pt with heavy reliance of legs on bed for gaining balance. Pt requiring Min A for gaining standing balance    Balance Overall balance assessment: Needs assistance Sitting-balance support: Feet supported;No upper extremity supported Sitting balance-Leahy Scale: Fair     Standing balance support: No upper extremity supported;During functional activity Standing  balance-Leahy Scale: Poor Standing balance comment: reliant on physical A for standing balance                           ADL either performed or assessed with clinical judgement   ADL Overall ADL's : Needs assistance/impaired Eating/Feeding: Independent;Sitting   Grooming: Supervision/safety;Set up;Sitting   Upper Body Bathing: Min guard;Sitting   Lower Body Bathing: Minimal assistance;Sit to/from stand   Upper Body Dressing : Min guard;Sitting   Lower Body Dressing: Minimal assistance;Sit to/from stand Lower Body Dressing Details (indicate cue type and reason): Min A for standing balance with both posterior and right lateral leans Toilet Transfer: Minimal assistance;Ambulation(simulated to recliner)           Functional mobility during ADLs: Minimal assistance General ADL Comments: Pt with decreased balance and safety.      Vision         Perception     Praxis      Pertinent Vitals/Pain Pain Assessment: No/denies pain     Hand Dominance Right   Extremity/Trunk Assessment Upper Extremity Assessment Upper Extremity Assessment: RUE deficits/detail RUE Deficits / Details: Noted decreased strength at RUE compared to LUE. Pt also with decreased initiate of reach with RUE.   Lower Extremity Assessment Lower Extremity Assessment: Defer to PT evaluation   Cervical / Trunk Assessment Cervical / Trunk Assessment: Other exceptions Cervical / Trunk Exceptions: Right lateral lean in standing   Communication Communication Communication: No difficulties   Cognition Arousal/Alertness: Awake/alert Behavior During Therapy: WFL for tasks assessed/performed Overall Cognitive Status: History of cognitive impairments - at baseline  General Comments: Baseline dementia.    General Comments  Sister-in-law present during session    Exercises     Shoulder Instructions      Home Living Family/patient expects to be  discharged to:: Skilled nursing facility   Available Help at Discharge: [place] Type of Home: [place]                                  Prior Functioning/Environment Level of Independence: Needs assistance        Comments: Assistance and supervision for ADLs and independent with mobility within SNF.         OT Problem List: Decreased strength;Decreased range of motion;Decreased activity tolerance;Impaired balance (sitting and/or standing);Decreased knowledge of use of DME or AE;Decreased knowledge of precautions;Impaired UE functional use;Decreased safety awareness      OT Treatment/Interventions: Self-care/ADL training;Therapeutic exercise;Energy conservation;DME and/or AE instruction;Therapeutic activities;Patient/family education    OT Goals(Current goals can be found in the care plan section) Acute Rehab OT Goals Patient Stated Goal: Unstated OT Goal Formulation: Patient unable to participate in goal setting Time For Goal Achievement: 09/24/18 Potential to Achieve Goals: Good  OT Frequency: Min 2X/week   Barriers to D/C:            Co-evaluation              AM-PAC OT "6 Clicks" Daily Activity     Outcome Measure Help from another person eating meals?: A Little Help from another person taking care of personal grooming?: A Little Help from another person toileting, which includes using toliet, bedpan, or urinal?: A Little Help from another person bathing (including washing, rinsing, drying)?: A Little Help from another person to put on and taking off regular upper body clothing?: A Little Help from another person to put on and taking off regular lower body clothing?: A Little 6 Click Score: 18   End of Session Equipment Utilized During Treatment: Gait belt Nurse Communication: Mobility status  Activity Tolerance: Patient tolerated treatment well Patient left: in chair;with call bell/phone within reach;with chair  alarm set;with family/visitor present  OT Visit Diagnosis: Unsteadiness on feet (R26.81);Other abnormalities of gait and mobility (R26.89);Muscle weakness (generalized) (M62.81);Other symptoms and signs involving cognitive function                Time: 4917-9150 OT Time Calculation (min): 21 min Charges:  OT General Charges $OT Visit: 1 Visit OT Evaluation $OT Eval Moderate Complexity: [place], OTR/L Acute Rehab Pager: [phone] Office: [place] 09/10/2018, 5:22 PM

## 2018-09-11 DIAGNOSIS — I6300 Cerebral infarction due to thrombosis of unspecified precerebral artery: Secondary | ICD-10-CM

## 2018-09-11 MED ORDER — APIXABAN 5 MG PO TABS
5.0000 mg | ORAL_TABLET | Freq: Two times a day (BID) | ORAL | Status: DC
  Administered 2018-09-11 – 2018-09-14 (×7): 5 mg via ORAL
  Filled 2018-09-11 (×7): qty 1

## 2018-09-11 NOTE — Clinical Social Work Note (Signed)
Clinical Social Work Assessment  Patient Details  Name: [name] MRN: [id number] Date of Birth: 10-27-1929  Date of referral:  09/11/18               Reason for consult:  Facility Placement, Discharge Planning                Permission sought to share information with:  Facility Sport and exercise psychologist, Guardian Permission granted to share information::  No(patient not oriented)  Name::     [name]  Agency::  [facility]; SNFs  Relationship::  legal guardian  Contact Information:  [phone] ext [phone]  Housing/Transportation Living arrangements for the past 2 months:  [place] of Information:  Facility, Guardian Patient Interpreter Needed:  None Criminal Activity/Legal Involvement Pertinent to Current Situation/Hospitalization:  No - Comment as needed Significant Relationships:  Other Family Members Lives with:  Facility Resident Do you feel safe going back to the place where you live?  Yes Need for family participation in patient care:  Yes (Comment)  Care giving concerns: Patient is a resident at [facility] in [place]. Admitted for stroke. PT recommending SNF.    Social Worker assessment / plan: Patient not fully oriented. No family at bedside and unable to reach family by phone number listed in chart. Spoke to patient's legal guardian, [name], with [place] [phone] ext [phone]).   CSW explained recommendations for SNF to guardian. [name] is in agreement for SNF, also open to discussing with patient's ALF if they'll be able to manage his needs without SNF. [name] had questions about patient's progress and projected discharge date. Patient's wife is a long term resident at [facility] in [place].   Sent out initial SNF referrals, awaiting bed offers. Will provide CMS list and bed offers to guardian when available. CSW to follow for medical readiness and support with disposition planning.  Employment  status:  Retired Forensic scientist:  Managed Medicare([employer]) PT Recommendations:  [place] / Referral to community resources:  [place]  Patient/Family's Response to care: Not discussed.  Patient/Family's Understanding of and Emotional Response to Diagnosis, Current Treatment, and Prognosis: Patient's guardian has good understanding of patient's condition and care needs. She is agreeable to SNF, but has remaining questions about patient's medical readiness.  Emotional Assessment Appearance:  Appears stated age Attitude/Demeanor/Rapport:  Unable to Assess Affect (typically observed):  Unable to Assess Orientation:  Oriented to Self, Oriented to Place Alcohol / Substance use:    Psych involvement (Current and /or in the community):     Discharge Needs  Concerns to be addressed:  Discharge Planning Concerns, Care Coordination Readmission within the last 30 days:  No Current discharge risk:  Physical Impairment, Cognitively Impaired Barriers to Discharge:  Continued Medical Work up, [place], [place] 09/11/2018, 3:03 PM

## 2018-09-11 NOTE — NC FL2 (Addendum)
[place] LEVEL OF CARE SCREENING TOOL     IDENTIFICATION  Patient Name: [name] Birthdate: 01/12/1930 Sex: male Admission Date (Current Location): 09/09/2018  [facility] and [place] Number:  Herbalist and Address:  [facility], [place] [address] [place], [place] [address]      Provider Number: [id number]  Attending Physician Name and Address:  Garvin Fila, MD  Relative Name and Phone Number:  Marinda Elk, legal guardian, [phone]    Current Level of Care: Hospital Recommended Level of Care: [place] Prior Approval Number:    Date Approved/Denied:   PASRR Number: [id number]  Discharge Plan: SNF    Current Diagnoses: Patient Active Problem List   Diagnosis Date Noted  . Hygroma 09/10/2018  . 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 Chest pain 12/29/2013  . Atrial fibrillation ([place]) 12/29/2013  . VERTIGO 02/04/2010  . [name] DEFICIENCY 01/31/2010  . Memory loss 01/27/2010  . PARESTHESIA 01/27/2010  . Hyperlipidemia 11/02/2009  . CORONARY ARTERY DISEASE 11/02/2009  . SINUSITIS, ACUTE 11/02/2009  . EPISTAXIS 11/02/2009  . COLONIC POLYPS, HX OF 11/02/2009    Orientation RESPIRATION BLADDER Height & Weight     Self, Place  Normal Continent Weight: 70.3 kg Height:  5\' 10"  (177.8 cm)  BEHAVIORAL SYMPTOMS/MOOD NEUROLOGICAL BOWEL NUTRITION STATUS      Continent Diet(please see DC summary)  AMBULATORY STATUS COMMUNICATION OF NEEDS Skin   Limited Assist Verbally Normal                       Personal Care Assistance Level of Assistance  Bathing, Feeding, Dressing Bathing Assistance: Limited assistance Feeding assistance: Independent Dressing Assistance: Limited assistance     Functional Limitations Info  Sight, Hearing, Speech Sight Info: Adequate Hearing Info: Adequate Speech Info: Adequate    SPECIAL CARE FACTORS FREQUENCY  PT (By licensed  PT), OT (By licensed OT)     PT Frequency: 5x/week OT Frequency: 5x/week            Contractures Contractures Info: Not present    Additional Factors Info  Code Status, Allergies Code Status Info: Full Allergies Info: No Known Allergies           Current Medications (09/11/2018):  This is the current hospital active medication list Current Facility-Administered Medications  Medication Dose Route Frequency Provider Last Rate Last Dose  . 0.9 %  sodium chloride infusion  50 mL/hr Intravenous Continuous Greta Doom, MD 50 mL/hr at 09/11/18 0800 50 mL/hr at 09/11/18 0800  . acetaminophen (TYLENOL) tablet 650 mg  650 mg Oral Q4H PRN Greta Doom, MD       Or  . acetaminophen (TYLENOL) solution 650 mg  650 mg Per Tube Q4H PRN Greta Doom, MD       Or  . acetaminophen (TYLENOL) suppository 650 mg  650 mg Rectal Q4H PRN Greta Doom, MD      . apixaban Arne Cleveland) tablet 5 mg  5 mg Oral BID Wynell Balloon, RPH   5 mg at 09/11/18 2878  . labetalol (NORMODYNE,TRANDATE) injection 10 mg  10 mg Intravenous Once PRN Greta Doom, MD       And  . nicardipine (CARDENE) 20mg  in 0.86% saline 276ml IV infusion (0.1 mg/ml)  0-15 mg/hr Intravenous Continuous PRN Greta Doom, MD      . pantoprazole (PROTONIX) injection 40 mg  40  mg Intravenous QHS Greta Doom, MD   40 mg at 09/10/18 2239     Discharge Medications: Please see discharge summary for a list of discharge medications.  Relevant Imaging Results:  Relevant Lab Results:   Additional Information SSN: [id number]  Estanislado Emms, LCSW  I have personally obtained history,examined this patient, reviewed notes, independently viewed imaging studies, participated in medical decision making and plan of care.ROS completed by me personally and pertinent positives fully documented  I have made any additions or clarifications directly to the above note. Agree with note  above.    Antony Contras, MD Medical Director [facility] Pager: [phone] 09/11/2018 4:45 PM

## 2018-09-11 NOTE — Care Management Note (Signed)
Case Management Note  Patient Details  Name: [name] MRN: [id number] Date of Birth: Apr 05, 1930  Subjective/Objective:  85 yo admitted with right hemiparesis s/p tPA with CT angiogram demonstrating distal carotid occlusion.  PTA, pt resided at [facility] in [place]. (Assisted Living Facility.)                   Action/Plan: PT/OT recommending SNF LOC for rehab at discharge.  Will consult CSW to facilitate discharge to SNF upon medical stability.  Pt has a legal guardian, [name].  Phone:  [phone].   Expected Discharge Date:                  Expected Discharge Plan:  Skilled Nursing Facility  In-House Referral:  Clinical Social Work  Discharge planning Services  CM Consult  Post Acute Care Choice:    Choice offered to:     DME Arranged:    DME Agency:     HH Arranged:    [place] Agency:     Status of Service:  In process, will continue to follow  If discussed at Long Length of Stay Meetings, dates discussed:    Additional Comments:  Reinaldo Raddle, RN, BSN  Trauma/Neuro ICU Case Manager [phone]

## 2018-09-11 NOTE — Plan of Care (Signed)
Care plan and education updated. Will continue to monitor. Lianne Bushy RN BSN.

## 2018-09-11 NOTE — Progress Notes (Addendum)
  Speech Language Pathology Treatment: Dysphagia  Patient Details Name: [name] MRN: [id number] DOB: 26-Jun-1930 Today's Date: 09/11/2018 Time: 2774-1287 SLP Time Calculation (min) (ACUTE ONLY): 11 min  Assessment / Plan / Recommendation Clinical Impression  Pt was seen for dysphagia treatment to ensure tolerance of the recommended diet. Pt and nursing reported that the pt has been tolerating the diet well without overt s/sx of aspiration. He was seen during part of breakfast and his swallow mechanism continues to be within functional limits. Further skilled SLP services are not clinically indicated at this time. Pt and nursing have been educated regarding this and both parties have verbalized understanding as well as agreement with plan of care.    HPI HPI: Pt is a 85 y.o. male with a history of atrial fibrillation. He was taken to [facility] secondary to right-sided weakness and IV TPA was administered. A CT angiogram revealed a carotid terminus occlusion with cross filling into the LMCA from [place]. Pt was transferred to [facility] for  potential thrombectomy.       SLP Plan  Discharge SLP treatment due to (comment);All goals met       Recommendations  Diet recommendations: Regular;Thin liquid Liquids provided via: Cup;Straw Medication Administration: Whole meds with puree Supervision: Staff to assist with self feeding Compensations: Small sips/bites;Slow rate Postural Changes and/or Swallow Maneuvers: Seated upright 90 degrees                Oral Care Recommendations: Oral care BID Follow up Recommendations: None;24 hour supervision/assistance SLP Visit Diagnosis: Dysphagia, unspecified (R13.10) Plan: Discharge SLP treatment due to (comment);All goals met       Bo Teicher I. Hardin Negus, [place], [place] Office number [phone] Pager [place] 09/11/2018, 8:38 AM

## 2018-09-11 NOTE — Progress Notes (Addendum)
STROKE TEAM PROGRESS NOTE   INTERVAL HISTORY He is sitting up in the bedside chair. His wife feels that he may have returned almost back to his baseline. He has no focal deficits.   Vitals:   09/11/18 1200 09/11/18 1205 09/11/18 1400 09/11/18 1600  BP: (!) 76/61 96/61 (!) 89/64   Pulse: (!) 58 (!) 56    Resp: 17 16 18    Temp: 98.4 F (36.9 C)   98.6 F (37 C)  TempSrc: Oral   Oral  SpO2: 96% 96%    Weight:      Height:        CBC:  Recent Labs  Lab 09/09/18 1935  WBC 4.8  NEUTROABS 3.0  HGB 12.2*  HCT 37.7*  MCV 98.2  PLT 146*    Basic Metabolic Panel:  Recent Labs  Lab 09/09/18 1935  NA 140  K 3.6  CL 106  CO2 29  GLUCOSE 128*  BUN 17  CREATININE 1.18  CALCIUM 9.0   Lipid Panel:     Component Value Date/Time   CHOL 136 01/27/2010 1221   TRIG 85.0 01/27/2010 1221   HDL 37.50 (L) 01/27/2010 1221   CHOLHDL 4 01/27/2010 1221   VLDL 17.0 01/27/2010 1221   LDLCALC 82 01/27/2010 1221   HgbA1c: No results found for: HGBA1C Urine Drug Screen: No results found for: LABOPIA, COCAINSCRNUR, LABBENZ, AMPHETMU, THCU, LABBARB  Alcohol Level     Component Value Date/Time   ETH <10 09/09/2018 1935    IMAGING Ct Angio Head W Or Wo Contrast  Result Date: 09/09/2018 CLINICAL DATA:  Code stroke. RIGHT-sided weakness and aphasia. History of bladder cancer and memory loss. EXAM: CT ANGIOGRAPHY HEAD AND NECK CT PERFUSION BRAIN TECHNIQUE: Multidetector CT imaging of the head and neck was performed using the standard protocol during bolus administration of intravenous contrast. Multiplanar CT image reconstructions and MIPs were obtained to evaluate the vascular anatomy. Carotid stenosis measurements (when applicable) are obtained utilizing NASCET criteria, using the distal internal carotid diameter as the denominator. Multiphase CT imaging of the brain was performed following IV bolus contrast injection. Subsequent parametric perfusion maps were calculated using RAPID software.  CONTRAST:  154mL ISOVUE-370 IOPAMIDOL (ISOVUE-370) INJECTION 76% COMPARISON:  CT HEAD February 07, 2010 FINDINGS: CT HEAD FINDINGS BRAIN: No intraparenchymal hemorrhage, mass effect nor midline shift. No parenchymal brain volume loss for age. No hydrocephalus. Patchy supratentorial white matter hypodensities less than expected for patient's age, though non-specific are most compatible with chronic small vessel ischemic disease. No acute large vascular territory infarcts. Streak artifact through middle cranial fossa limited assessment. No abnormal extra-axial fluid collections. Basal cisterns are patent. VASCULAR: Trace calcific atherosclerosis of the carotid siphons. SKULL: No skull fracture. No significant scalp soft tissue swelling. SINUSES/ORBITS: Trace paranasal sinus mucosal thickening. Mastoid air cells are well aerated.The included ocular globes and orbital contents are non-suspicious. Status post bilateral ocular lens implants. OTHER: Patient is edentulous. ASPECTS ([facility] Stroke Program Early CT Score) - Ganglionic level infarction (caudate, lentiform nuclei, internal capsule, insula, M1-M3 cortex): 7 - Supraganglionic infarction (M4-M6 cortex): 3 Total score (0-10 with 10 being normal): 10 CTA NECK FINDINGS: AORTIC ARCH: Normal appearance of the thoracic arch, normal branch pattern. Moderate calcific atherosclerosis aortic arch. The origins of the innominate, left Common carotid artery and subclavian artery are patent. 2.8 segment occluded RIGHT subclavian artery origin with reconstitution. RIGHT CAROTID SYSTEM: Common carotid artery is patent. Mild calcific atherosclerosis of the carotid bifurcation without hemodynamically significant stenosis by NASCET criteria.  Patent internal carotid artery, tonsillar loop. LEFT CAROTID SYSTEM: Common carotid artery is patent, mild atherosclerosis. Normal appearance of the carotid bifurcation without hemodynamically significant stenosis by NASCET criteria. Patent  internal carotid artery, tonsillar loop. VERTEBRAL ARTERIES:Left vertebral artery is dominant. Normal appearance of the vertebral arteries, widely patent. SKELETON: No acute osseous process though bone windows have not been submitted. Multiple thyroid nodules without dominant thyroid. No routine indicated follow-up. OTHER NECK: Soft tissues of the neck are nonacute though, not tailored for evaluation. Debris within hypopharynx. UPPER CHEST: Included lung apices are clear. Mild centrilobular emphysema. No superior mediastinal lymphadenopathy. CTA HEAD FINDINGS: ANTERIOR CIRCULATION: Patent cervical internal carotid arteries, petrous, cavernous internal carotid arteries. Sharp like defect LEFT paraclinoid ICA, occluded LEFT ICA carotid terminus. Distal patent anterior communicating artery. Patent anterior and middle cerebral arteries. No flow-limiting stenosis, contrast extravasation or aneurysm. POSTERIOR CIRCULATION: Patent vertebral arteries, vertebrobasilar junction and basilar artery, as well as main branch vessels. RIGHT vertebral artery predominantly terminates in the posterior inferior cerebellar artery. Severe stenosis RIGHT V4 segment. Patent posterior cerebral arteries. No large vessel occlusion, contrast extravasation or aneurysm. VENOUS SINUSES: Major dural venous sinuses are patent though not tailored for evaluation on this angiographic examination. ANATOMIC VARIANTS: None. DELAYED PHASE: Not performed. MIP images reviewed. CT Brain Perfusion Findings: CBF (<30%) Volume: 49mL Perfusion (Tmax>6.0s) volume: 380mL Mismatch Volume: 34mL Infarction Location:Infant at; severely motion degraded examination with LEFT > RIGHT confluent areas of penumbra most compatible with artifact. IMPRESSION: CT HEAD: 1. Negative non-contrast CT HEAD for age. CTA NECK: 1. No hemodynamically significant stenosis ICA's. Patent vertebral arteries. 2. 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 3. Patent  vertebral arteries. Debris within hypopharynx, aspiration risk. CTA HEAD: 1. Emergent LEFT carotid terminus thromboembolism; less likely dissection. Patent cerebral arteries. 2. Severe stenosis RIGHT V4 segment. CT PERFUSION: 1. Nondiagnostic examination due to motion. Critical Value/emergent results were called by telephone at the time of interpretation on 09/09/2018 at 8:17 pm to Dr. Varney Biles , who verbally acknowledged these results. Aortic Atherosclerosis (ICD10-I70.0). Emphysema (ICD10-J43.9). Electronically Signed   By: Elon Alas M.D.   On: 09/09/2018 20:33   Ct Head Wo Contrast  Result Date: 09/10/2018 CLINICAL DATA:  85 y/o M; stroke follow-up. Right-sided weakness and aphasia. EXAM: CT HEAD WITHOUT CONTRAST TECHNIQUE: Contiguous axial images were obtained from the base of the skull through the vertex without intravenous contrast. COMPARISON:  09/09/2018 CT head, CTA head, CT perfusion FINDINGS: Brain: Tiny cortical lucency in the left postcentral gyrus (series 3, image 29) may represent a recent infarction. No additional findings of stroke, hemorrhage, extra-axial collection, mass effect, or herniation. Nonspecific white matter hypodensities are compatible with mild chronic microvascular ischemic changes and there is volume loss of the brain Vascular: No hyperdense vessel or unexpected calcification. Skull: Normal. Negative for fracture or focal lesion. Sinuses/Orbits: No acute finding. Other: None. IMPRESSION: 1. Tiny cortical lucency in left postcentral gyrus may represent a recent infarction. No hemorrhage. 2. Otherwise no acute intracranial abnormality identified. 3. Mild chronic microvascular ischemic changes and parenchymal volume loss of the brain. Electronically Signed   By: Kristine Garbe M.D.   On: 09/10/2018 17:56   Ct Angio Neck W Or Wo Contrast  Result Date: 09/09/2018 CLINICAL DATA:  Code stroke. RIGHT-sided weakness and aphasia. History of bladder cancer and memory  loss. EXAM: CT ANGIOGRAPHY HEAD AND NECK CT PERFUSION BRAIN TECHNIQUE: Multidetector CT imaging of the head and neck was performed using the standard protocol during bolus administration  of intravenous contrast. Multiplanar CT image reconstructions and MIPs were obtained to evaluate the vascular anatomy. Carotid stenosis measurements (when applicable) are obtained utilizing NASCET criteria, using the distal internal carotid diameter as the denominator. Multiphase CT imaging of the brain was performed following IV bolus contrast injection. Subsequent parametric perfusion maps were calculated using RAPID software. CONTRAST:  160mL ISOVUE-370 IOPAMIDOL (ISOVUE-370) INJECTION 76% COMPARISON:  CT HEAD February 07, 2010 FINDINGS: CT HEAD FINDINGS BRAIN: No intraparenchymal hemorrhage, mass effect nor midline shift. No parenchymal brain volume loss for age. No hydrocephalus. Patchy supratentorial white matter hypodensities less than expected for patient's age, though non-specific are most compatible with chronic small vessel ischemic disease. No acute large vascular territory infarcts. Streak artifact through middle cranial fossa limited assessment. No abnormal extra-axial fluid collections. Basal cisterns are patent. VASCULAR: Trace calcific atherosclerosis of the carotid siphons. SKULL: No skull fracture. No significant scalp soft tissue swelling. SINUSES/ORBITS: Trace paranasal sinus mucosal thickening. Mastoid air cells are well aerated.The included ocular globes and orbital contents are non-suspicious. Status post bilateral ocular lens implants. OTHER: Patient is edentulous. ASPECTS ([facility] Stroke Program Early CT Score) - Ganglionic level infarction (caudate, lentiform nuclei, internal capsule, insula, M1-M3 cortex): 7 - Supraganglionic infarction (M4-M6 cortex): 3 Total score (0-10 with 10 being normal): 10 CTA NECK FINDINGS: AORTIC ARCH: Normal appearance of the thoracic arch, normal branch pattern. Moderate calcific  atherosclerosis aortic arch. The origins of the innominate, left Common carotid artery and subclavian artery are patent. 2.8 segment occluded RIGHT subclavian artery origin with reconstitution. RIGHT CAROTID SYSTEM: Common carotid artery is patent. Mild calcific atherosclerosis of the carotid bifurcation without hemodynamically significant stenosis by NASCET criteria. Patent internal carotid artery, tonsillar loop. LEFT CAROTID SYSTEM: Common carotid artery is patent, mild atherosclerosis. Normal appearance of the carotid bifurcation without hemodynamically significant stenosis by NASCET criteria. Patent internal carotid artery, tonsillar loop. VERTEBRAL ARTERIES:Left vertebral artery is dominant. Normal appearance of the vertebral arteries, widely patent. SKELETON: No acute osseous process though bone windows have not been submitted. Multiple thyroid nodules without dominant thyroid. No routine indicated follow-up. OTHER NECK: Soft tissues of the neck are nonacute though, not tailored for evaluation. Debris within hypopharynx. UPPER CHEST: Included lung apices are clear. Mild centrilobular emphysema. No superior mediastinal lymphadenopathy. CTA HEAD FINDINGS: ANTERIOR CIRCULATION: Patent cervical internal carotid arteries, petrous, cavernous internal carotid arteries. Sharp like defect LEFT paraclinoid ICA, occluded LEFT ICA carotid terminus. Distal patent anterior communicating artery. Patent anterior and middle cerebral arteries. No flow-limiting stenosis, contrast extravasation or aneurysm. POSTERIOR CIRCULATION: Patent vertebral arteries, vertebrobasilar junction and basilar artery, as well as main branch vessels. RIGHT vertebral artery predominantly terminates in the posterior inferior cerebellar artery. Severe stenosis RIGHT V4 segment. Patent posterior cerebral arteries. No large vessel occlusion, contrast extravasation or aneurysm. VENOUS SINUSES: Major dural venous sinuses are patent though not tailored  for evaluation on this angiographic examination. ANATOMIC VARIANTS: None. DELAYED PHASE: Not performed. MIP images reviewed. CT Brain Perfusion Findings: CBF (<30%) Volume: 92mL Perfusion (Tmax>6.0s) volume: 333mL Mismatch Volume: 355mL Infarction Location:Infant at; severely motion degraded examination with LEFT > RIGHT confluent areas of penumbra most compatible with artifact. IMPRESSION: CT HEAD: 1. Negative non-contrast CT HEAD for age. CTA NECK: 1. No hemodynamically significant stenosis ICA's. Patent vertebral arteries. 2. 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 3. Patent vertebral arteries. Debris within hypopharynx, aspiration risk. CTA HEAD: 1. Emergent LEFT carotid terminus thromboembolism; less likely dissection. Patent cerebral arteries. 2. Severe stenosis RIGHT V4 segment. CT PERFUSION:  1. Nondiagnostic examination due to motion. Critical Value/emergent results were called by telephone at the time of interpretation on 09/09/2018 at 8:17 pm to Dr. Varney Biles , who verbally acknowledged these results. Aortic Atherosclerosis (ICD10-I70.0). Emphysema (ICD10-J43.9). Electronically Signed   By: Elon Alas M.D.   On: 09/09/2018 20:33   Mr Brain Wo Contrast  Result Date: 09/10/2018 CLINICAL DATA:  85 y/o M; baseline dementia presenting with right-sided weakness. EXAM: MRI HEAD WITHOUT CONTRAST TECHNIQUE: Multiplanar, multiecho pulse sequences of the brain and surrounding structures were obtained without intravenous contrast. COMPARISON:  09/10/2018 CT head. 09/09/2018 CTA head and CT perfusion head. FINDINGS: Brain: Several small foci of reduced diffusion are present within the left pre and postcentral gyrus, left insula, left frontal operculum, and left anteromedial temporal lobe compatible with acute/early subacute infarction. No associated hemorrhage or mass effect. Background of punctate nonspecific T2 FLAIR hyperintensities in subcortical and periventricular white  matter are compatible with mild chronic microvascular ischemic changes. Moderate Volume loss of the brain. Punctate foci of susceptibility hypointensity are present within the left posterior insula and the right occipital lobe compatible with hemosiderin deposition of chronic microhemorrhage and distinct from regions of acute infarction. No extra-axial collection, hydrocephalus, mass effect, or herniation. Cavum et vergae. Vascular: Normal flow voids. Skull and upper cervical spine: Normal marrow signal. Sinuses/Orbits: Negative. Other: Bilateral intra-ocular lens replacement. IMPRESSION: 1. Several scattered small foci of acute/early subacute infarction are present throughout the left MCA distribution inclusive of the left pre and postcentral gyri. No associated hemorrhage or mass effect. 2. Mild chronic microvascular ischemic changes and moderate volume loss of the brain. These results will be called to the ordering clinician or representative by the Radiologist Assistant, and communication documented in the PACS or zVision Dashboard. Electronically Signed   By: Kristine Garbe M.D.   On: 09/10/2018 22:09   Ct Cerebral Perfusion W Contrast  Result Date: 09/09/2018 CLINICAL DATA:  Code stroke. RIGHT-sided weakness and aphasia. History of bladder cancer and memory loss. EXAM: CT ANGIOGRAPHY HEAD AND NECK CT PERFUSION BRAIN TECHNIQUE: Multidetector CT imaging of the head and neck was performed using the standard protocol during bolus administration of intravenous contrast. Multiplanar CT image reconstructions and MIPs were obtained to evaluate the vascular anatomy. Carotid stenosis measurements (when applicable) are obtained utilizing NASCET criteria, using the distal internal carotid diameter as the denominator. Multiphase CT imaging of the brain was performed following IV bolus contrast injection. Subsequent parametric perfusion maps were calculated using RAPID software. CONTRAST:  115mL ISOVUE-370  IOPAMIDOL (ISOVUE-370) INJECTION 76% COMPARISON:  CT HEAD February 07, 2010 FINDINGS: CT HEAD FINDINGS BRAIN: No intraparenchymal hemorrhage, mass effect nor midline shift. No parenchymal brain volume loss for age. No hydrocephalus. Patchy supratentorial white matter hypodensities less than expected for patient's age, though non-specific are most compatible with chronic small vessel ischemic disease. No acute large vascular territory infarcts. Streak artifact through middle cranial fossa limited assessment. No abnormal extra-axial fluid collections. Basal cisterns are patent. VASCULAR: Trace calcific atherosclerosis of the carotid siphons. SKULL: No skull fracture. No significant scalp soft tissue swelling. SINUSES/ORBITS: Trace paranasal sinus mucosal thickening. Mastoid air cells are well aerated.The included ocular globes and orbital contents are non-suspicious. Status post bilateral ocular lens implants. OTHER: Patient is edentulous. ASPECTS [facility] Stroke Program Early CT Score) - Ganglionic level infarction (caudate, lentiform nuclei, internal capsule, insula, M1-M3 cortex): 7 - Supraganglionic infarction (M4-M6 cortex): 3 Total score (0-10 with 10 being normal): 10 CTA NECK FINDINGS: AORTIC ARCH: Normal appearance of  the thoracic arch, normal branch pattern. Moderate calcific atherosclerosis aortic arch. The origins of the innominate, left Common carotid artery and subclavian artery are patent. 2.8 segment occluded RIGHT subclavian artery origin with reconstitution. RIGHT CAROTID SYSTEM: Common carotid artery is patent. Mild calcific atherosclerosis of the carotid bifurcation without hemodynamically significant stenosis by NASCET criteria. Patent internal carotid artery, tonsillar loop. LEFT CAROTID SYSTEM: Common carotid artery is patent, mild atherosclerosis. Normal appearance of the carotid bifurcation without hemodynamically significant stenosis by NASCET criteria. Patent internal carotid artery, tonsillar  loop. VERTEBRAL ARTERIES:Left vertebral artery is dominant. Normal appearance of the vertebral arteries, widely patent. SKELETON: No acute osseous process though bone windows have not been submitted. Multiple thyroid nodules without dominant thyroid. No routine indicated follow-up. OTHER NECK: Soft tissues of the neck are nonacute though, not tailored for evaluation. Debris within hypopharynx. UPPER CHEST: Included lung apices are clear. Mild centrilobular emphysema. No superior mediastinal lymphadenopathy. CTA HEAD FINDINGS: ANTERIOR CIRCULATION: Patent cervical internal carotid arteries, petrous, cavernous internal carotid arteries. Sharp like defect LEFT paraclinoid ICA, occluded LEFT ICA carotid terminus. Distal patent anterior communicating artery. Patent anterior and middle cerebral arteries. No flow-limiting stenosis, contrast extravasation or aneurysm. POSTERIOR CIRCULATION: Patent vertebral arteries, vertebrobasilar junction and basilar artery, as well as main branch vessels. RIGHT vertebral artery predominantly terminates in the posterior inferior cerebellar artery. Severe stenosis RIGHT V4 segment. Patent posterior cerebral arteries. No large vessel occlusion, contrast extravasation or aneurysm. VENOUS SINUSES: Major dural venous sinuses are patent though not tailored for evaluation on this angiographic examination. ANATOMIC VARIANTS: None. DELAYED PHASE: Not performed. MIP images reviewed. CT Brain Perfusion Findings: CBF (<30%) Volume: 80mL Perfusion (Tmax>6.0s) volume: 314mL Mismatch Volume: 327mL Infarction Location:Infant at; severely motion degraded examination with LEFT > RIGHT confluent areas of penumbra most compatible with artifact. IMPRESSION: CT HEAD: 1. Negative non-contrast CT HEAD for age. CTA NECK: 1. No hemodynamically significant stenosis ICA's. Patent vertebral arteries. 2. 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 3. Patent vertebral arteries. Debris within  hypopharynx, aspiration risk. CTA HEAD: 1. Emergent LEFT carotid terminus thromboembolism; less likely dissection. Patent cerebral arteries. 2. Severe stenosis RIGHT V4 segment. CT PERFUSION: 1. Nondiagnostic examination due to motion. Critical Value/emergent results were called by telephone at the time of interpretation on 09/09/2018 at 8:17 pm to Dr. Varney Biles , who verbally acknowledged these results. Aortic Atherosclerosis (ICD10-I70.0). Emphysema (ICD10-J43.9). Electronically Signed   By: Elon Alas M.D.   On: 09/09/2018 20:33   Ct Head Code Stroke Wo Contrast  Result Date: 09/09/2018 CLINICAL DATA:  Code stroke. RIGHT-sided weakness and aphasia. History of bladder cancer and memory loss. EXAM: CT ANGIOGRAPHY HEAD AND NECK CT PERFUSION BRAIN TECHNIQUE: Multidetector CT imaging of the head and neck was performed using the standard protocol during bolus administration of intravenous contrast. Multiplanar CT image reconstructions and MIPs were obtained to evaluate the vascular anatomy. Carotid stenosis measurements (when applicable) are obtained utilizing NASCET criteria, using the distal internal carotid diameter as the denominator. Multiphase CT imaging of the brain was performed following IV bolus contrast injection. Subsequent parametric perfusion maps were calculated using RAPID software. CONTRAST:  167mL ISOVUE-370 IOPAMIDOL (ISOVUE-370) INJECTION 76% COMPARISON:  CT HEAD February 07, 2010 FINDINGS: CT HEAD FINDINGS BRAIN: No intraparenchymal hemorrhage, mass effect nor midline shift. No parenchymal brain volume loss for age. No hydrocephalus. Patchy supratentorial white matter hypodensities less than expected for patient's age, though non-specific are most compatible with chronic small vessel ischemic disease. No acute large vascular territory infarcts.  Streak artifact through middle cranial fossa limited assessment. No abnormal extra-axial fluid collections. Basal cisterns are patent. VASCULAR:  Trace calcific atherosclerosis of the carotid siphons. SKULL: No skull fracture. No significant scalp soft tissue swelling. SINUSES/ORBITS: Trace paranasal sinus mucosal thickening. Mastoid air cells are well aerated.The included ocular globes and orbital contents are non-suspicious. Status post bilateral ocular lens implants. OTHER: Patient is edentulous. ASPECTS ([place] Stroke Program Early CT Score) - Ganglionic level infarction (caudate, lentiform nuclei, internal capsule, insula, M1-M3 cortex): 7 - Supraganglionic infarction (M4-M6 cortex): 3 Total score (0-10 with 10 being normal): 10 CTA NECK FINDINGS: AORTIC ARCH: Normal appearance of the thoracic arch, normal branch pattern. Moderate calcific atherosclerosis aortic arch. The origins of the innominate, left Common carotid artery and subclavian artery are patent. 2.8 segment occluded RIGHT subclavian artery origin with reconstitution. RIGHT CAROTID SYSTEM: Common carotid artery is patent. Mild calcific atherosclerosis of the carotid bifurcation without hemodynamically significant stenosis by NASCET criteria. Patent internal carotid artery, tonsillar loop. LEFT CAROTID SYSTEM: Common carotid artery is patent, mild atherosclerosis. Normal appearance of the carotid bifurcation without hemodynamically significant stenosis by NASCET criteria. Patent internal carotid artery, tonsillar loop. VERTEBRAL ARTERIES:Left vertebral artery is dominant. Normal appearance of the vertebral arteries, widely patent. SKELETON: No acute osseous process though bone windows have not been submitted. Multiple thyroid nodules without dominant thyroid. No routine indicated follow-up. OTHER NECK: Soft tissues of the neck are nonacute though, not tailored for evaluation. Debris within hypopharynx. UPPER CHEST: Included lung apices are clear. Mild centrilobular emphysema. No superior mediastinal lymphadenopathy. CTA HEAD FINDINGS: ANTERIOR CIRCULATION: Patent cervical internal carotid  arteries, petrous, cavernous internal carotid arteries. Sharp like defect LEFT paraclinoid ICA, occluded LEFT ICA carotid terminus. Distal patent anterior communicating artery. Patent anterior and middle cerebral arteries. No flow-limiting stenosis, contrast extravasation or aneurysm. POSTERIOR CIRCULATION: Patent vertebral arteries, vertebrobasilar junction and basilar artery, as well as main branch vessels. RIGHT vertebral artery predominantly terminates in the posterior inferior cerebellar artery. Severe stenosis RIGHT V4 segment. Patent posterior cerebral arteries. No large vessel occlusion, contrast extravasation or aneurysm. VENOUS SINUSES: Major dural venous sinuses are patent though not tailored for evaluation on this angiographic examination. ANATOMIC VARIANTS: None. DELAYED PHASE: Not performed. MIP images reviewed. CT Brain Perfusion Findings: CBF (<30%) Volume: 26mL Perfusion (Tmax>6.0s) volume: 349mL Mismatch Volume: 365mL Infarction Location:Infant at; severely motion degraded examination with LEFT > RIGHT confluent areas of penumbra most compatible with artifact. IMPRESSION: CT HEAD: 1. Negative non-contrast CT HEAD for age. CTA NECK: 1. No hemodynamically significant stenosis ICA's. Patent vertebral arteries. 2. 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 3. Patent vertebral arteries. Debris within hypopharynx, aspiration risk. CTA HEAD: 1. Emergent LEFT carotid terminus thromboembolism; less likely dissection. Patent cerebral arteries. 2. Severe stenosis RIGHT V4 segment. CT PERFUSION: 1. Nondiagnostic examination due to motion. Critical Value/emergent results were called by telephone at the time of interpretation on 09/09/2018 at 8:17 pm to Dr. Varney Biles , who verbally acknowledged these results. Aortic Atherosclerosis (ICD10-I70.0). Emphysema (ICD10-J43.9). Electronically Signed   By: Elon Alas M.D.   On: 09/09/2018 20:33    PHYSICAL EXAM Pleasant elderly  Caucasian male not in distress. . Afebrile. Head is nontraumatic. Neck is supple without bruit.    Cardiac exam no murmur or gallop. Lungs are clear to auscultation. Distal pulses are well felt. Neurological Exam :  Neurological Exam ;  Awake  Alert oriented to person only.. Normal speech and language.diminished attention, registration and recall.  Follows only simple midline  and one-step commands.  Poor insight into his illness.  Eye movements full without nystagmus.fundi were not visualized. Vision acuity and fields appear normal. Hearing is normal. Palatal movements are normal. Face symmetric. Tongue midline. Mild right hemiparesis 4/5 strength with weakness of right grip and intrinsic hand muscles.  Orbits left over right upper extremity.  Mild weakness of right hip flexors and ankle dorsiflexors.  Normal sensation. Gait deferred.   ASSESSMENT/PLAN [name] is a 85 y.o. male with history of A. fib not on anticoagulation x1 year, hygroma, significant dementia who was ambulatory and able to speak at baseline presenting from SNF to [facility] with right-sided weakness.  Received tPA at [facility] 09/09/2018 at 2047 and transferred to [facility].  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 ASPECTS 10.     CTA head ELVO L ICA. Severe R V4 stenosis   CTA neck no ELVO. R subclavian artery occlusion  CT perfusion non diagnostic  24h CT TINY LEFT PRECENTRAL GYRUS INFARCT.  Small vessel disease. Atrophy.   MRI  Several small acute infarcts involving left pre-and postcentral gyrus, left insula and frontal operculum and medial temporal lobe.  2D Echo  Diminished ejection fraction of 40-45% with a bicoronal hypokinesis. Left atrium mildly dilated.  LDL pending   HgbA1c pending   SCDs for VTE prophylaxis  aspirin 81 mg daily prior to admission, ok to start aspirin 325 mg daily 24h  post tPA  Therapy recommendations:  SNF  Disposition:  pending   Keep in ICU. Plan transfer to floor vs return to SNF tomorrow.  Atrial Fibrillation  Home anticoagulation:  none   Anticoagulation stopped 1 year ago.    Consider anticoagulation at discharge  Other Stroke Risk Factors  Advanced age  Former cigarette smoker, quit 55 years ago   Coronary artery disease  Other Active Problems  Baseline dementia  Hygroma  Hospital day # 2   .  He has baseline dementia but presented with right-sided weakness likely due to terminal left ICA occlusion.  He received IV TPA but was not considered for intervention given his poor neurological baseline.  He seems to have obtained significant improvement.  Continue strict blood pressure control and close neurological monitoring    Mobilize out of bed.  Therapy consults. Transfer to neurology floor bed. Aspirin after follow-up post TPA CT. discuss with his wife and answered questions.  Start eliquis for anticoagulation given history of atrial fibrillation unknown embolic stroke.This patient is critically ill and at significant risk of neurological worsening, death and care requires constant monitoring of vital signs, hemodynamics,respiratory and cardiac monitoring, extensive review of multiple databases, frequent neurological assessment, discussion with family, other specialists and medical decision making of high complexity.I have made any additions or clarifications directly to the above note.This critical care time does not reflect procedure time, or teaching time or supervisory time of PA/NP/Med Resident etc but could involve care discussion time.  I spent 30 minutes of neurocritical care time  in the care of  this patient.      Antony Contras, MD Medical Director [place] Pager: [phone] 09/11/2018 5:24 PM  To contact Stroke Continuity provider, please refer to [email or web address] After hours, contact General Neurology

## 2018-09-12 LAB — LIPID PANEL
Cholesterol: 171 mg/dL (ref 0–200)
HDL: 35 mg/dL — ABNORMAL LOW (ref >40)
LDL Cholesterol: 118 mg/dL — ABNORMAL HIGH (ref 0–99)
Total CHOL/HDL Ratio: 4.9 RATIO
Triglycerides: 89 mg/dL (ref <150)
VLDL: 18 mg/dL (ref 0–40)

## 2018-09-12 LAB — HEMOGLOBIN A1C
Hgb A1c MFr Bld: 5.6 % (ref 4.8–5.6)
Mean Plasma Glucose: 114.02 mg/dL

## 2018-09-12 LAB — CBC
HCT: 34.2 % — ABNORMAL LOW (ref 39.0–52.0)
Hemoglobin: 11.6 g/dL — ABNORMAL LOW (ref 13.0–17.0)
MCH: 32 pg (ref 26.0–34.0)
MCHC: 33.9 g/dL (ref 30.0–36.0)
MCV: 94.2 fL (ref 80.0–100.0)
Platelets: 134 10*3/uL — ABNORMAL LOW (ref 150–400)
RBC: 3.63 MIL/uL — ABNORMAL LOW (ref 4.22–5.81)
RDW: 13 % (ref 11.5–15.5)
WBC: 5.4 10*3/uL (ref 4.0–10.5)
nRBC: 0 % (ref 0.0–0.2)

## 2018-09-12 MED ORDER — APIXABAN 5 MG PO TABS: 5.0000 mg | ORAL_TABLET | Freq: Two times a day (BID) | ORAL | 2 refills | Status: DC

## 2018-09-12 NOTE — Progress Notes (Signed)
Per Dr. Leonie Man, patient is medically stable for discharge. Will place orders for discharge. Will continue to monitor. Lianne Bushy RN BSN.

## 2018-09-12 NOTE — Progress Notes (Signed)
Physical Therapy Treatment Patient Details Name: [name] MRN: [id number] DOB: 1929-12-21 Today's Date: 09/12/2018    History of Present Illness 85 yo admitted from ALF with right hemiparesis s/p tPA with CT angiogram demonstrating distal carotid occlusion. PMhx: dementia, Afib, hygroma, CAD, HLD, bladder CA    PT Comments    Pt pleasant and eager for therapy today. Pt progressed gait training to include ambulation with no AD. Pt ambulated with improved gait pattern and safety without AD. Pt will continue to benefit from skilled therapy to improve gait and balance deficits in order to decrease caregiver burden and improve independence.   Follow Up Recommendations  SNF     Equipment Recommendations  None recommended by PT    Recommendations for Other Services       Precautions / Restrictions Precautions Precautions: Fall Restrictions Weight Bearing Restrictions: No    Mobility  Bed Mobility Overal bed mobility: Needs Assistance       Supine to sit: Supervision     General bed mobility comments: supervision for lines  Transfers Overall transfer level: Needs assistance Equipment used: None Transfers: Sit to/from Stand Sit to Stand: Min guard         General transfer comment: min guard for safety without RW  Ambulation/Gait Ambulation/Gait assistance: Min assist Gait Distance (Feet): 150 Feet Assistive device: None Gait Pattern/deviations: Step-through pattern;Decreased stride length     General Gait Details: pt ambulating with no AD, min A secondary to right lateral lean and decreased foot clearance on R, pt required cuing for not dragging right foot   Stairs             Wheelchair Mobility    Modified Rankin (Stroke Patients Only) Modified Rankin (Stroke Patients Only) Pre-Morbid Rankin Score: Moderate disability Modified Rankin: Moderately severe disability     Balance Overall balance assessment: Needs assistance Sitting-balance  support: Feet supported;No upper extremity supported Sitting balance-Leahy Scale: Fair     Standing balance support: No upper extremity supported Standing balance-Leahy Scale: Fair                              Cognition Arousal/Alertness: Awake/alert Behavior During Therapy: WFL for tasks assessed/performed Overall Cognitive Status: History of cognitive impairments - at baseline                                 General Comments: Baseline dementia.       Exercises Total Joint Exercises Long Arc Quad: AROM;Strengthening;Both;10 reps;Seated(cuing to slow down) Marching in Standing: AROM;Strengthening;Both;Seated;10 reps    General Comments        Pertinent Vitals/Pain Pain Assessment: No/denies pain    Home Living                      Prior Function            PT Goals (current goals can now be found in the care plan section) Progress towards PT goals: Progressing toward goals    Frequency    Min 3X/week      PT Plan Current plan remains appropriate    Co-evaluation              AM-PAC PT "6 Clicks" Mobility   Outcome Measure  Help needed turning from your back to your side while in a flat bed without using bedrails?: None Help needed moving  from lying on your back to sitting on the side of a flat bed without using bedrails?: None Help needed moving to and from a bed to a chair (including a wheelchair)?: A Little Help needed standing up from a chair using your arms (e.g., wheelchair or bedside chair)?: A Little Help needed to walk in hospital room?: A Little Help needed climbing 3-5 steps with a railing? : A Lot 6 Click Score: 19    End of Session Equipment Utilized During Treatment: Gait belt Activity Tolerance: Patient tolerated treatment well Patient left: in chair;with chair alarm set;with call bell/phone within reach Nurse Communication: Mobility status PT Visit Diagnosis: Unsteadiness on feet (R26.81);Other  symptoms and signs involving the nervous system (R29.898);Difficulty in walking, not elsewhere classified (R26.2)     Time: 1470-9295 PT Time Calculation (min) (ACUTE ONLY): 14 min  Charges:  $Gait Training: 8-22 mins                     [place], [place] [phone]    Brigida Scotti 09/12/2018, 10:46 AM

## 2018-09-12 NOTE — Progress Notes (Addendum)
Patient arrived to the floor from [place] to self and place. Disoriented to time and situation. Denies pain Skin is clean dry and intact  Placed on tele All questions and concerns addressed. Bed in the lowest position with bed alarm on. Call light in reach.   Patient is looking for his denture. He has the bottom denture in but the top dentures are not. Nurse and Nurse tech looked for denture in the room and bags after he arrived from [place], we could not find them. Nurse tech went to [employer] to look for denture and they where not there.

## 2018-09-12 NOTE — Progress Notes (Signed)
CSW following for discharge plan. CSW reached out to patient's guardian earlier today to discuss bed offers. Patient's guardian would like to choose [facility]. CSW reached out to Admissions, they have started insurance authorization.   Awaiting authorization for patient to admit to SNF. CSW to continue to follow.  Laveda Abbe, [place] Clinical Social Worker [phone]

## 2018-09-12 NOTE — Discharge Summary (Deleted)
Stroke Discharge Summary  Patient ID: [name]   MRN: [id number]      DOB: 15-Oct-1929  Date of Admission: 09/09/2018 Date of Discharge: 09/13/2018  Attending Physician:  Garvin Fila, MD, Stroke MD Consultant(s):   Treatment Team:  Stroke, Md, MD  Patient's PCP:  Patient, No Pcp Per  DISCHARGE DIAGNOSIS:  Principal Problem:   Stroke (cerebrum) ([place]) left brain infarct status post TPA etiology cardiac embolic from atrial fibrillation Active Problems:   Hyperlipidemia   Memory loss   Atrial fibrillation ([place])   Hygroma   Past Medical History:  Diagnosis Date  . [name] deficiency 2011  . Bladder cancer ([place])   . CAD (coronary artery disease)   . Hx of colonic polyp   . Hyperlipidemia   . Memory loss 2011   Past Surgical History:  Procedure Laterality Date  . CHOLECYSTECTOMY      Allergies as of 09/12/2018   No Known Allergies     Medication List    STOP taking these medications   metoprolol succinate 25 MG 24 hr tablet Commonly known as:  TOPROL XL   Rivaroxaban 15 MG Tabs tablet Commonly known as:  XARELTO     TAKE these medications   acetaminophen 500 MG tablet Commonly known as:  TYLENOL Take 500 mg by mouth every 6 (six) hours as needed for mild pain, fever or headache.   apixaban 5 MG Tabs tablet Commonly known as:  ELIQUIS Take 1 tablet (5 mg total) by mouth 2 (two) times daily.       LABORATORY STUDIES CBC    Component Value Date/Time   WBC 5.4 09/12/2018 0354   RBC 3.63 (L) 09/12/2018 0354   HGB 11.6 (L) 09/12/2018 0354   HCT 34.2 (L) 09/12/2018 0354   PLT 134 (L) 09/12/2018 0354   MCV 94.2 09/12/2018 0354   MCH 32.0 09/12/2018 0354   MCHC 33.9 09/12/2018 0354   RDW 13.0 09/12/2018 0354   LYMPHSABS 1.1 09/09/2018 1935   MONOABS 0.6 09/09/2018 1935   EOSABS 0.1 09/09/2018 1935   BASOSABS 0.1 09/09/2018 1935   CMP    Component Value Date/Time   NA 140 09/09/2018 1935   K 3.6 09/09/2018 1935   CL 106 09/09/2018 1935    CO2 29 09/09/2018 1935   GLUCOSE 128 (H) 09/09/2018 1935   BUN 17 09/09/2018 1935   CREATININE 1.18 09/09/2018 1935   CALCIUM 9.0 09/09/2018 1935   PROT 6.4 (L) 09/09/2018 1935   ALBUMIN 3.7 09/09/2018 1935   AST 18 09/09/2018 1935   ALT 6 09/09/2018 1935   ALKPHOS 73 09/09/2018 1935   BILITOT 1.2 09/09/2018 1935   GFRNONAA 56 (L) 09/09/2018 1935   GFRAA >60 09/09/2018 1935   COAGS Lab Results  Component Value Date   INR 1.0 09/09/2018   INR 2.2 (H) 03/17/2009   INR 1.3 12/23/2008   Lipid Panel    Component Value Date/Time   CHOL 171 09/12/2018 0354   TRIG 89 09/12/2018 0354   HDL 35 (L) 09/12/2018 0354   CHOLHDL 4.9 09/12/2018 0354   VLDL 18 09/12/2018 0354   LDLCALC 118 (H) 09/12/2018 0354   HgbA1C  Lab Results  Component Value Date   HGBA1C 5.6 09/12/2018   Urinalysis    Component Value Date/Time   COLORURINE YELLOW 01/27/2010 1221   APPEARANCEUR CLEAR 01/27/2010 1221   LABSPEC >=1.030 01/27/2010 1221   PHURINE 6.0 01/27/2010 1221   Belt 01/27/2010 1221  BILIRUBINUR NEGATIVE 01/27/2010 1221   KETONESUR NEGATIVE 01/27/2010 1221   UROBILINOGEN 1.0 01/27/2010 1221   NITRITE NEGATIVE 01/27/2010 1221   LEUKOCYTESUR NEGATIVE 01/27/2010 1221   Urine Drug Screen No results found for: LABOPIA, COCAINSCRNUR, LABBENZ, AMPHETMU, THCU, LABBARB  Alcohol Level    Component Value Date/Time   ETH <10 09/09/2018 1935     SIGNIFICANT DIAGNOSTIC STUDIES CT HEAD:  1. Negative non-contrast CT HEAD for age.  CTA NECK:  1. No hemodynamically significant stenosis ICA's. Patent vertebral arteries. 2. 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 3. Patent vertebral arteries. Debris within hypopharynx, aspiration risk.  CTA HEAD:  1. Emergent LEFT carotid terminus thromboembolism; less likely dissection. Patent cerebral arteries. 2. Severe stenosis RIGHT V4 segment.  CT PERFUSION:  1. Nondiagnostic examination due to  motion.  MRI 09/10/2018 IMPRESSION: 1. Several scattered small foci of acute/early subacute infarction are present throughout the left MCA distribution inclusive of the left pre and postcentral gyri. No associated hemorrhage or mass effect. 2. Mild chronic microvascular ischemic changes and moderate volume loss of the brain.     2D ECHO:   1. The left ventricle has mild-moderately reduced systolic function, with an ejection fraction of 40-45%. The cavity size was normal. There is mildly increased left ventricular wall thickness. Left ventricular diastolic Doppler parameters are  indeterminate.  2. Hypokinesis of the apical, mid to apical anteroseptal, mid-apical inferoseptal, apical anterior, and apical anteroseptal myocardium.  3. The right ventricle has normal systolic function. The cavity was normal. There is no increase in right ventricular wall thickness.  4. Left atrial size was mildly dilated.  5. Right atrial size was severely dilated.  6. The mitral valve is normal in structure.  7. The tricuspid valve is normal in structure.  8. The aortic valve is tricuspid Aortic valve regurgitation is mild by color flow Doppler.  9. The pulmonic valve was normal in structure. Pulmonic valve regurgitation is mild by color flow Doppler. 10. Large patent foramen ovale. 11. Cannot rule out PFO by color flow Doppler.   HISTORY OF PRESENT ILLNESS [name] is a 85 y.o. male with history of A. fib not on anticoagulation x1 year, hygroma, significant dementia who was ambulatory and able to speak at baseline presenting from SNF to [facility] with right-sided weakness.  Received tPA at [facility] 09/09/2018 at 2047 and transferred to [facility].  HOSPITAL COURSE 09/09/2018: admitted for stroke; IV TPA given     DISCHARGE EXAM Blood pressure (!) 117/58, pulse (!) 59, temperature 98.6 F (37 C), temperature source Oral, resp. rate 17, height 5\' 10"  (1.778 m), weight 70.3 kg, SpO2  100 %.  PHYSICAL EXAM per Dr. Leonie Man [place] elderly Caucasian male not in distress. . Afebrile. Head is nontraumatic. Neck is supple without bruit.    Cardiac exam no murmur or gallop. Lungs are clear to auscultation. Distal pulses are well felt. Neurological Exam :  Neurological Exam ;  Awake  Alert oriented to person only.. Normal speech and language.diminished attention, registration and recall.  Follows only simple midline and one-step commands.  Poor insight into his illness.  Eye movements full without nystagmus.fundi were not visualized. Vision acuity and fields appear normal. Hearing is normal. Palatal movements are normal. Face symmetric. Tongue midline. Mild right hemiparesis 4/5 strength with weakness of right grip and intrinsic hand muscles.  Orbits left over right upper extremity.  Mild weakness of right hip flexors and ankle dorsiflexors.  Normal sensation. Gait deferred.  Discharge Diet    Diet Order            Diet regular Room service appropriate? Yes with Assist; Fluid consistency: Thin  Diet effective now             liquids  DISCHARGE PLAN  Disposition:  SNF  Eliquis (apixaban) twice daily for secondary stroke prevention.  Ongoing risk factor control by Primary Care Physician at time of discharge  Follow-up Patient, No Pcp Per in 2 weeks.  Follow-up in [facility] in 4 weeks, office to schedule an appointment.   33 minutes were spent preparing discharge.  Laurey Morale, MSN, NP-C [facility] [phone]  I have personally obtained history,examined this patient, reviewed notes, independently viewed imaging studies, participated in medical decision making and plan of care.ROS completed by me personally and pertinent positives fully documented  I have made any additions or clarifications directly to the above note. Agree with note above.   Antony Contras, MD Medical Director [facility] Pager:  [phone] 09/13/2018 11:21 AM

## 2018-09-13 MED ORDER — PANTOPRAZOLE SODIUM 40 MG PO TBEC
40.0000 mg | DELAYED_RELEASE_TABLET | Freq: Every day | ORAL | Status: DC
  Administered 2018-09-14: 40 mg via ORAL
  Filled 2018-09-13: qty 1

## 2018-09-13 NOTE — Discharge Instructions (Signed)

## 2018-09-13 NOTE — Progress Notes (Signed)
Physical Therapy Treatment Patient Details Name: [name] MRN: [id number] DOB: Nov 26, 1929 Today's Date: 09/13/2018    History of Present Illness 85 yo admitted from ALF with right hemiparesis s/p tPA with CT angiogram demonstrating distal carotid occlusion. PMhx: dementia, Afib, hygroma, CAD, HLD, bladder CA    PT Comments    Pt presented in chair and agreed to perform gait and therex activities. Pt able to perform higher level balance activities with cues for hand placement and posture with encouragement to keep him from being distracted. Baseline dementia is a limiting factor to treatment and at times requires multimodal cues to progress with tx. Pt R side weakness is still apparent during gait and at times he moves too quickly, becomes flexed with his posture, and is limited by poor foot clearance. D/t pt current impairments and limitations it is still recommended that SNF is appropriate for placement at d/c. Pt would greatly benefit from SNF level of care at this time. Plan to progress pt with therex and higher level balance and gait activities to improve functional mobility and promote independence.   Follow Up Recommendations  SNF     Equipment Recommendations  None recommended by PT    Recommendations for Other Services OT consult     Precautions / Restrictions Precautions Precautions: Fall Restrictions Weight Bearing Restrictions: No    Mobility  Bed Mobility               General bed mobility comments: pt in chair upon arrival  Transfers Overall transfer level: Needs assistance Equipment used: None Transfers: Sit to/from Stand Sit to Stand: Min guard         General transfer comment: min guard for safety without RW  Ambulation/Gait Ambulation/Gait assistance: Min assist Gait Distance (Feet): 150 Feet Assistive device: None Gait Pattern/deviations: Step-through pattern;Decreased stride length     General Gait Details: pt ambulating with no  AD, min A secondary to right lateral lean and decreased foot clearance on R, pt required cueing for not dragging right foot, Cues for reciprocal arm swing.   Stairs             Wheelchair Mobility    Modified Rankin (Stroke Patients Only) Modified Rankin (Stroke Patients Only) Pre-Morbid Rankin Score: Moderate disability Modified Rankin: Moderately severe disability     Balance Overall balance assessment: Needs assistance Sitting-balance support: Feet supported;No upper extremity supported Sitting balance-Leahy Scale: Fair     Standing balance support: No upper extremity supported Standing balance-Leahy Scale: Fair Standing balance comment: reliant on physical A for standing balance             High level balance activites: Side stepping High Level Balance Comments: Pt performed side stepping at front desk down and back x 2 (10 feet each way for 40 feet total)            Cognition Arousal/Alertness: Awake/alert Behavior During Therapy: WFL for tasks assessed/performed Overall Cognitive Status: History of cognitive impairments - at baseline                                 General Comments: Baseline dementia.       Exercises General Exercises - Lower Extremity Long Arc Quad: AROM;10 reps;Both;Seated(2 second isometric hold at top) Hip Flexion/Marching: 10 reps;AROM;Standing;Both Mini-Sqauts: 10 reps;AROM;Both;Seated    General Comments        Pertinent Vitals/Pain Pain Assessment: No/denies pain    Home  Living                      Prior Function            PT Goals (current goals can now be found in the care plan section) Acute Rehab PT Goals Patient Stated Goal: to get better PT Goal Formulation: With patient Time For Goal Achievement: 09/17/18 Potential to Achieve Goals: Good    Frequency    Min 3X/week      PT Plan Current plan remains appropriate    Co-evaluation              AM-PAC PT "6 Clicks"  Mobility   Outcome Measure  Help needed turning from your back to your side while in a flat bed without using bedrails?: None Help needed moving from lying on your back to sitting on the side of a flat bed without using bedrails?: None Help needed moving to and from a bed to a chair (including a wheelchair)?: A Little Help needed standing up from a chair using your arms (e.g., wheelchair or bedside chair)?: A Little Help needed to walk in hospital room?: A Little Help needed climbing 3-5 steps with a railing? : A Lot 6 Click Score: 19    End of Session Equipment Utilized During Treatment: Gait belt Activity Tolerance: Patient tolerated treatment well Patient left: in chair;with chair alarm set;with call bell/phone within reach Nurse Communication: Mobility status PT Visit Diagnosis: Unsteadiness on feet (R26.81);Other symptoms and signs involving the nervous system (R29.898);Difficulty in walking, not elsewhere classified (R26.2)     Time: 9458-5929 PT Time Calculation (min) (ACUTE ONLY): 19 min  Charges:  $Gait Training: 8-22 mins                     Maryelizabeth Kaufmann, SPTA   Maryelizabeth Kaufmann 09/13/2018, 10:59 AM

## 2018-09-13 NOTE — Progress Notes (Signed)
STROKE TEAM PROGRESS NOTE   INTERVAL HISTORY He is sitting up in the bedside chair. He is medically stable for Dc to snf when bed available. He has no focal deficits.   Vitals:   09/12/18 1939 09/12/18 2354 09/13/18 0420 09/13/18 0854  BP: 133/71 126/68 129/64 102/65  Pulse: 81 68 68 70  Resp: 18 17 17 15   Temp: 97.7 F (36.5 C) 97.8 F (36.6 C) 97.8 F (36.6 C) 98 F (36.7 C)  TempSrc: Oral Oral Oral Oral  SpO2: 100% 97% 97% 96%  Weight:      Height:        CBC:  Recent Labs  Lab 09/09/18 1935 09/12/18 0354  WBC 4.8 5.4  NEUTROABS 3.0  --   HGB 12.2* 11.6*  HCT 37.7* 34.2*  MCV 98.2 94.2  PLT 146* 134*    Basic Metabolic Panel:  Recent Labs  Lab 09/09/18 1935  NA 140  K 3.6  CL 106  CO2 29  GLUCOSE 128*  BUN 17  CREATININE 1.18  CALCIUM 9.0   Lipid Panel:     Component Value Date/Time   CHOL 171 09/12/2018 0354   TRIG 89 09/12/2018 0354   HDL 35 (L) 09/12/2018 0354   CHOLHDL 4.9 09/12/2018 0354   VLDL 18 09/12/2018 0354   LDLCALC 118 (H) 09/12/2018 0354   HgbA1c:  Lab Results  Component Value Date   HGBA1C 5.6 09/12/2018   Urine Drug Screen: No results found for: LABOPIA, COCAINSCRNUR, LABBENZ, AMPHETMU, THCU, LABBARB  Alcohol Level     Component Value Date/Time   ETH <10 09/09/2018 1935    IMAGING No results found.  PHYSICAL EXAM Pleasant elderly Caucasian male not in distress. . Afebrile. Head is nontraumatic. Neck is supple without bruit.    Cardiac exam no murmur or gallop. Lungs are clear to auscultation. Distal pulses are well felt. Neurological Exam :  Neurological Exam ;  Awake  Alert oriented to person only.. Normal speech and language.diminished attention, registration and recall.  Follows only simple midline and one-step commands.  Poor insight into his illness.  Eye movements full without nystagmus.fundi were not visualized. Vision acuity and fields appear normal. Hearing is normal. Palatal movements are normal. Face symmetric.  Tongue midline. Mild right hemiparesis 4/5 strength with weakness of right grip and intrinsic hand muscles.  Orbits left over right upper extremity.  Mild weakness of right hip flexors and ankle dorsiflexors.  Normal sensation. Gait deferred.   ASSESSMENT/PLAN [name] is a 85 y.o. male with history of A. fib not on anticoagulation x1 year, hygroma, significant dementia who was ambulatory and able to speak at baseline presenting from SNF to [facility] with right-sided weakness.  Received tPA at [facility] 09/09/2018 at 2047 and transferred to [facility]  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 ASPECTS 10.     CTA head ELVO L ICA. Severe R V4 stenosis   CTA neck no ELVO. R subclavian artery occlusion  CT perfusion non diagnostic  24h CT TINY LEFT PRECENTRAL GYRUS INFARCT.  Small vessel disease. Atrophy.   MRI  Several small acute infarcts involving left pre-and postcentral gyrus, left insula and frontal operculum and medial temporal lobe.  2D Echo  Diminished ejection fraction of 40-45% with a bicoronal hypokinesis. Left atrium mildly dilated.  LDL pending   HgbA1c pending   SCDs for  VTE prophylaxis  aspirin 81 mg daily prior to admission, ok to start aspirin 325 mg daily 24h post tPA  Therapy recommendations:  SNF  Disposition:  pending   Keep in ICU. Plan transfer to floor vs return to SNF tomorrow.  Atrial Fibrillation  Home anticoagulation:  none   Anticoagulation stopped 1 year ago.    Consider anticoagulation at discharge  Other Stroke Risk Factors  Advanced age  Former cigarette smoker, quit 4 years ago   Coronary artery disease  Other Active Problems  Baseline dementia  Hygroma  Hospital day # 4   .  He has baseline dementia but presented with right-sided weakness likely due to terminal left ICA occlusion.  He received IV TPA but  was not considered for intervention given his poor neurological baseline.  He seems to have obtained significant improvement.  Continue  eliquis for anticoagulation given history of atrial fibrillation   Await transfer to SNF when bed available   Antony Contras, [place] Pager: [phone] 09/13/2018 12:28 PM  To contact Stroke Continuity provider, please refer to [email or web address] After hours, contact General Neurology

## 2018-09-14 DIAGNOSIS — I4821 Permanent atrial fibrillation: Secondary | ICD-10-CM | POA: Diagnosis not present

## 2018-09-14 DIAGNOSIS — I69351 Hemiplegia and hemiparesis following cerebral infarction affecting right dominant side: Secondary | ICD-10-CM | POA: Diagnosis not present

## 2018-09-14 DIAGNOSIS — I639 Cerebral infarction, unspecified: Secondary | ICD-10-CM | POA: Diagnosis not present

## 2018-09-14 DIAGNOSIS — I6340 Cerebral infarction due to embolism of unspecified cerebral artery: Secondary | ICD-10-CM | POA: Diagnosis not present

## 2018-09-14 DIAGNOSIS — I4891 Unspecified atrial fibrillation: Secondary | ICD-10-CM | POA: Diagnosis not present

## 2018-09-14 DIAGNOSIS — R410 Disorientation, unspecified: Secondary | ICD-10-CM | POA: Diagnosis not present

## 2018-09-14 DIAGNOSIS — R41841 Cognitive communication deficit: Secondary | ICD-10-CM | POA: Diagnosis not present

## 2018-09-14 DIAGNOSIS — I2510 Atherosclerotic heart disease of native coronary artery without angina pectoris: Secondary | ICD-10-CM | POA: Diagnosis not present

## 2018-09-14 DIAGNOSIS — E785 Hyperlipidemia, unspecified: Secondary | ICD-10-CM

## 2018-09-14 DIAGNOSIS — R413 Other amnesia: Secondary | ICD-10-CM

## 2018-09-14 DIAGNOSIS — D181 Lymphangioma, any site: Secondary | ICD-10-CM | POA: Diagnosis not present

## 2018-09-14 DIAGNOSIS — I6359 Cerebral infarction due to unspecified occlusion or stenosis of other cerebral artery: Secondary | ICD-10-CM | POA: Diagnosis not present

## 2018-09-14 DIAGNOSIS — G459 Transient cerebral ischemic attack, unspecified: Secondary | ICD-10-CM | POA: Diagnosis not present

## 2018-09-14 DIAGNOSIS — R402411 Glasgow coma scale score 13-15, in the field [EMT or ambulance]: Secondary | ICD-10-CM | POA: Diagnosis not present

## 2018-09-14 DIAGNOSIS — I63232 Cerebral infarction due to unspecified occlusion or stenosis of left carotid arteries: Secondary | ICD-10-CM

## 2018-09-14 DIAGNOSIS — C679 Malignant neoplasm of bladder, unspecified: Secondary | ICD-10-CM | POA: Diagnosis not present

## 2018-09-14 DIAGNOSIS — M2550 Pain in unspecified joint: Secondary | ICD-10-CM | POA: Diagnosis not present

## 2018-09-14 DIAGNOSIS — F0390 Unspecified dementia without behavioral disturbance: Secondary | ICD-10-CM | POA: Diagnosis not present

## 2018-09-14 DIAGNOSIS — Z7401 Bed confinement status: Secondary | ICD-10-CM | POA: Diagnosis not present

## 2018-09-14 DIAGNOSIS — N289 Disorder of kidney and ureter, unspecified: Secondary | ICD-10-CM | POA: Diagnosis not present

## 2018-09-14 MED ORDER — ATORVASTATIN CALCIUM 10 MG PO TABS: 20.0000 mg | ORAL_TABLET | Freq: Every day | ORAL | Status: DC

## 2018-09-14 MED ORDER — ATORVASTATIN CALCIUM 20 MG PO TABS: 20.0000 mg | ORAL_TABLET | Freq: Every day | ORAL | Status: DC

## 2018-09-14 NOTE — Progress Notes (Signed)
Patient will DC to: [place] date: 09/14/2018 Family notified: Yes Transport by: Corey Harold   Per MD patient ready for DC to . RN, patient, patient's family, and facility notified of DC. Discharge Summary and FL2 sent to facility. RN to call report prior to discharge [phone]). DC packet on chart. Ambulance transport requested for patient.   CSW will sign off for now as social work intervention is no longer needed. Please consult [place] again if new needs arise.  Lasondra Hodgkins, LCSW-A [place]/Clinical Social Work Department Cell: [phone]

## 2018-09-14 NOTE — Clinical Social Work Placement (Signed)
Nurse to call report to [phone] and will be going to room A 3 bed 1. PTAR has been called and scheduled.   CLINICAL SOCIAL WORK PLACEMENT  NOTE  Date:  09/14/2018  Patient Details  Name: [name] MRN: [id number] Date of Birth: March 07, 1930  Clinical Social Work is seeking post-discharge placement for this patient at the [place] level of care (*CSW will initial, date and re-position this form in  chart as items are completed):  Yes   Patient/family provided with [place] Work Department's list of facilities offering this level of care within the geographic area requested by the patient (or if unable, by the patient's family).  Yes   Patient/family informed of their freedom to choose among providers that offer the needed level of care, that participate in Medicare, Medicaid or managed care program needed by the patient, have an available bed and are willing to accept the patient.  Yes   Patient/family informed of [facility]'s ownership interest in [facility] and [facility], as well as of the fact that they are under no obligation to receive care at these facilities.  PASRR submitted to EDS on       PASRR number received on 09/11/18     Existing PASRR number confirmed on       FL2 transmitted to all facilities in geographic area requested by pt/family on 09/11/18     FL2 transmitted to all facilities within larger geographic area on       Patient informed that his/her managed care company has contracts with or will negotiate with certain facilities, including the following:        Yes   Patient/family informed of bed offers received.  Patient chooses bed at Other - please specify in the comment section below:([facility] )     Physician recommends and patient chooses bed at      Patient to be transferred to Other - please specify in the comment section below:([facility] ) on 21/19/41.  Patient to be transferred to  facility by ptar     Patient family notified on 09/14/18 of transfer.  Name of family member notified:  Legal Guardian      PHYSICIAN       Additional Comment:    _______________________________________________ Gelene Mink, [place] 09/14/2018, 3:37 PM

## 2018-09-14 NOTE — Progress Notes (Signed)
Pt discharge education completed. Pt discharge to [facility] and report called off to nurse LeResa at the facility. Legal guardian called to notify of discharge but unable to reach her. Pt IV and telemetry removed; pt in bed awaiting on PTAR to come pick and transport off to disposition. Will closely monitor till pt pick; Charge RN Levada Dy updated. Delia Heady RN

## 2018-09-14 NOTE — Discharge Summary (Addendum)
Patient ID: [name]   MRN: [id number]      DOB: 26-Jul-1929  Date of Admission: 09/09/2018 Date of Discharge: 09/14/2018  Attending Physician:  Garvin Fila, MD, Stroke MD Consultant(s):   Treatment Team:  Stroke, Md, MD Tele-Neurology Dr Junie Panning at [facility] Patient's PCP:  Patient, No Pcp Per  DISCHARGE DIAGNOSIS:   left MCA scattered infarcts status post TPA  Active Problems:   Hyperlipidemia   Dementia   Atrial fibrillation ([place])   Hygroma   Past Medical History:  Diagnosis Date  . [name] deficiency 2011  . Bladder cancer ([place])   . CAD (coronary artery disease)   . Hx of colonic polyp   . Hyperlipidemia   . Memory loss 2011   Past Surgical History:  Procedure Laterality Date  . CHOLECYSTECTOMY      Allergies as of 09/14/2018   No Known Allergies     Medication List    STOP taking these medications   metoprolol succinate 25 MG 24 hr tablet Commonly known as:  Toprol XL   Rivaroxaban 15 MG Tabs tablet Commonly known as:  Xarelto     TAKE these medications   acetaminophen 500 MG tablet Commonly known as:  TYLENOL Take 500 mg by mouth every 6 (six) hours as needed for mild pain, fever or headache.   apixaban 5 MG Tabs tablet Commonly known as:  ELIQUIS Take 1 tablet (5 mg total) by mouth 2 (two) times daily.   atorvastatin 20 MG tablet Commonly known as:  LIPITOR Take 1 tablet (20 mg total) by mouth daily at 6 PM.       HOME MEDICATIONS PRIOR TO ADMISSION Medications Prior to Admission  Medication Sig Dispense Refill  . acetaminophen (TYLENOL) 500 MG tablet Take 500 mg by mouth every 6 (six) hours as needed for mild pain, fever or headache.    . metoprolol succinate (TOPROL XL) 25 MG 24 hr tablet Take 1 tablet (25 mg total) by mouth daily. (Patient not taking: Reported on 09/10/2018) 30 tablet 6  . Rivaroxaban (XARELTO) 15 MG TABS tablet Take 1 tablet (15 mg total) by mouth daily. (Patient not taking: Reported on 09/10/2018) 140 tablet [place] . apixaban  5 mg Oral BID  . atorvastatin  20 mg Oral q1800  . pantoprazole  40 mg Oral Daily    LABORATORY STUDIES CBC    Component Value Date/Time   WBC 5.4 09/12/2018 0354   RBC 3.63 (L) 09/12/2018 0354   HGB 11.6 (L) 09/12/2018 0354   HCT 34.2 (L) 09/12/2018 0354   PLT 134 (L) 09/12/2018 0354   MCV 94.2 09/12/2018 0354   MCH 32.0 09/12/2018 0354   MCHC 33.9 09/12/2018 0354   RDW 13.0 09/12/2018 0354   LYMPHSABS 1.1 09/09/2018 1935   MONOABS 0.6 09/09/2018 1935   EOSABS 0.1 09/09/2018 1935   BASOSABS 0.1 09/09/2018 1935   CMP    Component Value Date/Time   NA 140 09/09/2018 1935   K 3.6 09/09/2018 1935   CL 106 09/09/2018 1935   CO2 29 09/09/2018 1935   GLUCOSE 128 (H) 09/09/2018 1935   BUN 17 09/09/2018 1935   CREATININE 1.18 09/09/2018 1935   CALCIUM 9.0 09/09/2018 1935   PROT 6.4 (L) 09/09/2018 1935   ALBUMIN 3.7 09/09/2018 1935   AST 18 09/09/2018 1935   ALT 6 09/09/2018 1935   ALKPHOS 73 09/09/2018 1935   BILITOT 1.2 09/09/2018 1935  GFRNONAA 56 (L) 09/09/2018 1935   GFRAA >60 09/09/2018 1935   COAGS Lab Results  Component Value Date   INR 1.0 09/09/2018   INR 2.2 (H) 03/17/2009   INR 1.3 12/23/2008   Lipid Panel    Component Value Date/Time   CHOL 171 09/12/2018 0354   TRIG 89 09/12/2018 0354   HDL 35 (L) 09/12/2018 0354   CHOLHDL 4.9 09/12/2018 0354   VLDL 18 09/12/2018 0354   LDLCALC 118 (H) 09/12/2018 0354   HgbA1C  Lab Results  Component Value Date   HGBA1C 5.6 09/12/2018   Urinalysis    Component Value Date/Time   COLORURINE YELLOW 01/27/2010 1221   APPEARANCEUR CLEAR 01/27/2010 1221   LABSPEC >=1.030 01/27/2010 1221   PHURINE 6.0 01/27/2010 1221   GLUCOSEU NEGATIVE 01/27/2010 1221   BILIRUBINUR NEGATIVE 01/27/2010 1221   KETONESUR NEGATIVE 01/27/2010 1221   UROBILINOGEN 1.0 01/27/2010 1221   NITRITE NEGATIVE 01/27/2010 1221   LEUKOCYTESUR NEGATIVE 01/27/2010 1221   Urine Drug Screen No results  found for: LABOPIA, COCAINSCRNUR, LABBENZ, AMPHETMU, THCU, LABBARB  Alcohol Level    Component Value Date/Time   ETH <10 09/09/2018 1935     SIGNIFICANT DIAGNOSTIC STUDIES  CT HEAD WO Contrast 09/10/2018 IMPRESSION: 1. Tiny cortical lucency in left postcentral gyrus may represent a recent infarction. No hemorrhage. 2. Otherwise no acute intracranial abnormality identified. 3. Mild chronic microvascular ischemic changes and parenchymal volume loss of the brain.   CTA NECK: 09/09/2018 IMPRESSION 1. No hemodynamically significant stenosis ICA's. Patent vertebral arteries. 2. 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 3. Patent vertebral arteries. Debris within hypopharynx, aspiration risk.   CTA HEAD: 09/09/2018 IMPRESSION 1. Emergent LEFT carotid terminus thromboembolism; less likely dissection. Patent cerebral arteries. 2. Severe stenosis RIGHT V4 segment.   CT PERFUSION: 09/09/2018 IMPRESSION Nondiagnostic examination due to motion.  MRI BRAIN WO CONTRAST 09/10/2018 IMPRESSION: 1. Several scattered small foci of acute/early subacute infarction are present throughout the left MCA distribution inclusive of the left pre and postcentral gyri. No associated hemorrhage or mass effect. 2. Mild chronic microvascular ischemic changes and moderate volume loss of the brain.     Transthoracic Echocardiogram 09/10/2018 Impression 1. The left ventricle has mild-moderately reduced systolic function, with an ejection fraction of 40-45%. The cavity size was normal. There is mildly increased left ventricular wall thickness. Left ventricular diastolic Doppler parameters are indeterminate. 2. Hypokinesis of the apical, mid to apical anteroseptal, mid-apical inferoseptal, apical anterior, and apical anteroseptal myocardium. 3. The right ventricle has normal systolic function. The cavity was normal. There is no increase in right ventricular wall thickness. 4. Left  atrial size was mildly dilated. 5. Right atrial size was severely dilated. 6. The mitral valve is normal in structure. 7. The tricuspid valve is normal in structure. 8. The aortic valve is tricuspid Aortic valve regurgitation is mild by color flow Doppler. 9. The pulmonic valve was normal in structure. Pulmonic valve regurgitation is mild by color flow Doppler. 10. Large patent foramen ovale. 11. Cannot rule out PFO by color flow Doppler.     HISTORY OF PRESENT ILLNESS (from Dr Cecil Cobbs Admission H&P 09/09/2018) ERYK BEAVERS is a 85 y.o. male with a history of atrial fibrillation as well as hygroma who was taken off of anticoagulation approximately 1 year ago. I called his nursing home to discuss what his baseline status is, and he is ambulatory, and able to speak, but has significant dementia.  Though he feeds himself, he has to be  told when to go to eat, has to be given his medications, has to essentially be lead through the day. He was in his normal state of health earlier tonight, then sometime after 6:30 PM, he was found to have significant right-sided weakness. He was taken to [facility] where he was given IV TPA and a CT angiogram revealed a distal carotid occlusion. He was transferred to [facility] for consideration of possible emergent thrombectomy.  LKW: 6:30 PM tpa given?:  Yes Modified Rankin Scale: 3-Moderate disability-requires help but walks WITHOUT assistance    HOSPITAL COURSE  Mr. DEXTON ZWILLING is a 85 y.o. male with history of A. fib not on anticoagulation x1 year, hygroma, and significant dementia who was ambulatory and able to speak at baseline presenting from SNF to [facility] with right-sided weakness.  Received tPA at [facility] 09/09/2018 at 2047 and transferred to [facility].  Stroke:  left MCA scattered small infarcts due to L distal ICA occlusion, infarct secondary to known A. fib not on anticoagulation   Code Stroke CT head  No acute stroke. ASPECTS 10.     CTA head ELVO L ICA. Severe R V4 stenosis   CTA neck no ELVO. R subclavian artery occlusion  CT perfusion non diagnostic  MRI  Several small acute infarcts involving left pre-and postcentral gyrus, left insula and frontal operculum and medial temporal lobe.  2D Echo   EF 40-45% with a bicoronal hypokinesis. Left atrium mildly dilated.  LDL - 118  HgbA1c - 5.6  SCDs for VTE prophylaxis  aspirin 81 mg daily prior to admission, now on Eliquis 5 mg twice daily.  Continue on discharge  Therapy recommendations:  SNF with 24/7 supervision  Disposition:  Discharge to SNF 09/14/2018  Atrial Fibrillation  Home anticoagulation:  none   Rate controlled  Anticoagulation stopped 1 year ago.    Eliquis restarted  BP management  BP stable on the low side  Metoprolol discontinued as heart rate and blood pressure were both well controlled.  Avoid hypotension  Long-term BP goal 130-150 given left distal ICA occlusion  Hyperlipidemia  No statin PTA  LDL 118, goal less than 70  Lipitor 20 added  Continue statin on discharge  Other Stroke Risk Factors  Advanced age  Former cigarette smoker, quit 3 years ago   Coronary artery disease  Other Active Problems  Baseline dementia  Hygroma  Mild anemia   DISCHARGE EXAM Vitals:   09/14/18 0512 09/14/18 0755 09/14/18 1159 09/14/18 1159  BP: (!) 123/59 135/71 123/64 123/64  Pulse: 61 65 65 65  Resp: 17 16 18 18   Temp: 97.8 F (36.6 C) 98 F (36.7 C) 98.1 F (36.7 C) 98.1 F (36.7 C)  TempSrc: Oral Oral Oral Oral  SpO2: 97% 97% 97% 97%  Weight:      Height:       Pleasant elderly Caucasian male not in distress. . Afebrile. Head is nontraumatic. Neck is supple without bruit.    Cardiac exam no murmur or gallop. Lungs are clear to auscultation. Distal pulses are well felt. Neurological Exam :  Neurological Exam ;  Awake  Alert oriented to person only.. Normal speech and  language.diminished attention, registration and recall.  Follows only simple midline and one-step commands.  Poor insight into his illness.  Eye movements full without nystagmus.fundi were not visualized. Vision acuity and fields appear normal. Hearing is normal. Palatal movements are normal. Face symmetric. Tongue midline. Mild right hemiparesis 4/5 strength with  weakness of right grip and intrinsic hand muscles.  Orbits left over right upper extremity.  Mild weakness of right hip flexors and ankle dorsiflexors.  Normal sensation. Gait deferred.   Discharge Diet    Diet Order            Diet regular Room service appropriate? Yes with Assist; Fluid consistency: Thin  Diet effective now             liquids  DISCHARGE PLAN  Disposition: discharge to a skilled nursing facility.  Eliquis (apixaban) daily for secondary stroke prevention.  Ongoing risk factor control by Primary Care Physician at time of discharge  Follow-up Patient, No Pcp Per in 2 weeks.  Follow-up in [facility] in 4 weeks, office to schedule an appointment.   35 minutes were spent preparing discharge.  Rosalin Hawking, MD PhD Stroke Neurology 09/14/2018 5:12 PM

## 2018-09-16 DIAGNOSIS — N289 Disorder of kidney and ureter, unspecified: Secondary | ICD-10-CM | POA: Diagnosis not present

## 2018-09-16 DIAGNOSIS — I4891 Unspecified atrial fibrillation: Secondary | ICD-10-CM | POA: Diagnosis not present

## 2018-09-16 DIAGNOSIS — E785 Hyperlipidemia, unspecified: Secondary | ICD-10-CM | POA: Diagnosis not present

## 2018-09-16 DIAGNOSIS — I69351 Hemiplegia and hemiparesis following cerebral infarction affecting right dominant side: Secondary | ICD-10-CM | POA: Diagnosis not present

## 2018-09-18 DIAGNOSIS — E785 Hyperlipidemia, unspecified: Secondary | ICD-10-CM | POA: Diagnosis not present

## 2018-09-18 DIAGNOSIS — I4891 Unspecified atrial fibrillation: Secondary | ICD-10-CM | POA: Diagnosis not present

## 2018-09-18 DIAGNOSIS — N289 Disorder of kidney and ureter, unspecified: Secondary | ICD-10-CM | POA: Diagnosis not present

## 2018-09-18 DIAGNOSIS — I69351 Hemiplegia and hemiparesis following cerebral infarction affecting right dominant side: Secondary | ICD-10-CM | POA: Diagnosis not present

## 2018-09-19 DIAGNOSIS — I4891 Unspecified atrial fibrillation: Secondary | ICD-10-CM | POA: Diagnosis not present

## 2018-09-19 DIAGNOSIS — E785 Hyperlipidemia, unspecified: Secondary | ICD-10-CM | POA: Diagnosis not present

## 2018-09-19 DIAGNOSIS — I69351 Hemiplegia and hemiparesis following cerebral infarction affecting right dominant side: Secondary | ICD-10-CM | POA: Diagnosis not present

## 2018-09-19 DIAGNOSIS — N289 Disorder of kidney and ureter, unspecified: Secondary | ICD-10-CM | POA: Diagnosis not present

## 2018-09-23 DIAGNOSIS — Z111 Encounter for screening for respiratory tuberculosis: Secondary | ICD-10-CM | POA: Diagnosis not present

## 2018-09-24 DIAGNOSIS — R278 Other lack of coordination: Secondary | ICD-10-CM | POA: Diagnosis not present

## 2018-09-24 DIAGNOSIS — M6281 Muscle weakness (generalized): Secondary | ICD-10-CM | POA: Diagnosis not present

## 2018-09-24 DIAGNOSIS — R2689 Other abnormalities of gait and mobility: Secondary | ICD-10-CM | POA: Diagnosis not present

## 2018-09-25 DIAGNOSIS — R278 Other lack of coordination: Secondary | ICD-10-CM | POA: Diagnosis not present

## 2018-09-25 DIAGNOSIS — M6281 Muscle weakness (generalized): Secondary | ICD-10-CM | POA: Diagnosis not present

## 2018-09-25 DIAGNOSIS — R2689 Other abnormalities of gait and mobility: Secondary | ICD-10-CM | POA: Diagnosis not present

## 2018-09-26 DIAGNOSIS — Z7901 Long term (current) use of anticoagulants: Secondary | ICD-10-CM | POA: Diagnosis not present

## 2018-09-26 DIAGNOSIS — I69351 Hemiplegia and hemiparesis following cerebral infarction affecting right dominant side: Secondary | ICD-10-CM | POA: Diagnosis not present

## 2018-09-26 DIAGNOSIS — I4891 Unspecified atrial fibrillation: Secondary | ICD-10-CM | POA: Diagnosis not present

## 2018-09-26 DIAGNOSIS — F0390 Unspecified dementia without behavioral disturbance: Secondary | ICD-10-CM | POA: Diagnosis not present

## 2018-09-30 DIAGNOSIS — Z7901 Long term (current) use of anticoagulants: Secondary | ICD-10-CM | POA: Diagnosis not present

## 2018-09-30 DIAGNOSIS — I4891 Unspecified atrial fibrillation: Secondary | ICD-10-CM | POA: Diagnosis not present

## 2018-09-30 DIAGNOSIS — F0390 Unspecified dementia without behavioral disturbance: Secondary | ICD-10-CM | POA: Diagnosis not present

## 2018-09-30 DIAGNOSIS — I69351 Hemiplegia and hemiparesis following cerebral infarction affecting right dominant side: Secondary | ICD-10-CM | POA: Diagnosis not present

## 2018-10-01 DIAGNOSIS — Z7901 Long term (current) use of anticoagulants: Secondary | ICD-10-CM | POA: Diagnosis not present

## 2018-10-01 DIAGNOSIS — I4891 Unspecified atrial fibrillation: Secondary | ICD-10-CM | POA: Diagnosis not present

## 2018-10-01 DIAGNOSIS — F0390 Unspecified dementia without behavioral disturbance: Secondary | ICD-10-CM | POA: Diagnosis not present

## 2018-10-01 DIAGNOSIS — I69351 Hemiplegia and hemiparesis following cerebral infarction affecting right dominant side: Secondary | ICD-10-CM | POA: Diagnosis not present

## 2018-10-02 DIAGNOSIS — I69351 Hemiplegia and hemiparesis following cerebral infarction affecting right dominant side: Secondary | ICD-10-CM | POA: Diagnosis not present

## 2018-10-02 DIAGNOSIS — F0390 Unspecified dementia without behavioral disturbance: Secondary | ICD-10-CM | POA: Diagnosis not present

## 2018-10-02 DIAGNOSIS — I4891 Unspecified atrial fibrillation: Secondary | ICD-10-CM | POA: Diagnosis not present

## 2018-10-02 DIAGNOSIS — Z7901 Long term (current) use of anticoagulants: Secondary | ICD-10-CM | POA: Diagnosis not present

## 2018-10-03 ENCOUNTER — Encounter: Payer: Self-pay | Admitting: Cardiovascular Disease

## 2018-10-03 DIAGNOSIS — I4891 Unspecified atrial fibrillation: Secondary | ICD-10-CM | POA: Diagnosis not present

## 2018-10-03 DIAGNOSIS — Z1389 Encounter for screening for other disorder: Secondary | ICD-10-CM | POA: Diagnosis not present

## 2018-10-03 DIAGNOSIS — E785 Hyperlipidemia, unspecified: Secondary | ICD-10-CM | POA: Diagnosis not present

## 2018-10-03 DIAGNOSIS — Z0001 Encounter for general adult medical examination with abnormal findings: Secondary | ICD-10-CM | POA: Diagnosis not present

## 2018-10-03 DIAGNOSIS — F0390 Unspecified dementia without behavioral disturbance: Secondary | ICD-10-CM | POA: Diagnosis not present

## 2018-10-03 DIAGNOSIS — I69351 Hemiplegia and hemiparesis following cerebral infarction affecting right dominant side: Secondary | ICD-10-CM | POA: Diagnosis not present

## 2018-10-03 DIAGNOSIS — Z7901 Long term (current) use of anticoagulants: Secondary | ICD-10-CM | POA: Diagnosis not present

## 2018-10-03 DIAGNOSIS — Z1331 Encounter for screening for depression: Secondary | ICD-10-CM | POA: Diagnosis not present

## 2018-10-03 DIAGNOSIS — I480 Paroxysmal atrial fibrillation: Secondary | ICD-10-CM | POA: Diagnosis not present

## 2018-10-03 DIAGNOSIS — G301 Alzheimer's disease with late onset: Secondary | ICD-10-CM | POA: Diagnosis not present

## 2018-10-04 DIAGNOSIS — F0390 Unspecified dementia without behavioral disturbance: Secondary | ICD-10-CM | POA: Diagnosis not present

## 2018-10-04 DIAGNOSIS — Z7901 Long term (current) use of anticoagulants: Secondary | ICD-10-CM | POA: Diagnosis not present

## 2018-10-04 DIAGNOSIS — I69351 Hemiplegia and hemiparesis following cerebral infarction affecting right dominant side: Secondary | ICD-10-CM | POA: Diagnosis not present

## 2018-10-04 DIAGNOSIS — I4891 Unspecified atrial fibrillation: Secondary | ICD-10-CM | POA: Diagnosis not present

## 2018-10-08 DIAGNOSIS — I69351 Hemiplegia and hemiparesis following cerebral infarction affecting right dominant side: Secondary | ICD-10-CM | POA: Diagnosis not present

## 2018-10-08 DIAGNOSIS — Z7901 Long term (current) use of anticoagulants: Secondary | ICD-10-CM | POA: Diagnosis not present

## 2018-10-08 DIAGNOSIS — I4891 Unspecified atrial fibrillation: Secondary | ICD-10-CM | POA: Diagnosis not present

## 2018-10-08 DIAGNOSIS — F0390 Unspecified dementia without behavioral disturbance: Secondary | ICD-10-CM | POA: Diagnosis not present

## 2018-10-09 DIAGNOSIS — F0390 Unspecified dementia without behavioral disturbance: Secondary | ICD-10-CM | POA: Diagnosis not present

## 2018-10-09 DIAGNOSIS — I69351 Hemiplegia and hemiparesis following cerebral infarction affecting right dominant side: Secondary | ICD-10-CM | POA: Diagnosis not present

## 2018-10-09 DIAGNOSIS — I4891 Unspecified atrial fibrillation: Secondary | ICD-10-CM | POA: Diagnosis not present

## 2018-10-09 DIAGNOSIS — Z7901 Long term (current) use of anticoagulants: Secondary | ICD-10-CM | POA: Diagnosis not present

## 2018-10-10 DIAGNOSIS — F0390 Unspecified dementia without behavioral disturbance: Secondary | ICD-10-CM | POA: Diagnosis not present

## 2018-10-10 DIAGNOSIS — Z7901 Long term (current) use of anticoagulants: Secondary | ICD-10-CM | POA: Diagnosis not present

## 2018-10-10 DIAGNOSIS — I4891 Unspecified atrial fibrillation: Secondary | ICD-10-CM | POA: Diagnosis not present

## 2018-10-10 DIAGNOSIS — I69351 Hemiplegia and hemiparesis following cerebral infarction affecting right dominant side: Secondary | ICD-10-CM | POA: Diagnosis not present

## 2018-10-11 DIAGNOSIS — I4891 Unspecified atrial fibrillation: Secondary | ICD-10-CM | POA: Diagnosis not present

## 2018-10-11 DIAGNOSIS — Z7901 Long term (current) use of anticoagulants: Secondary | ICD-10-CM | POA: Diagnosis not present

## 2018-10-11 DIAGNOSIS — F0390 Unspecified dementia without behavioral disturbance: Secondary | ICD-10-CM | POA: Diagnosis not present

## 2018-10-11 DIAGNOSIS — I69351 Hemiplegia and hemiparesis following cerebral infarction affecting right dominant side: Secondary | ICD-10-CM | POA: Diagnosis not present

## 2018-10-14 DIAGNOSIS — I69351 Hemiplegia and hemiparesis following cerebral infarction affecting right dominant side: Secondary | ICD-10-CM | POA: Diagnosis not present

## 2018-10-14 DIAGNOSIS — I4891 Unspecified atrial fibrillation: Secondary | ICD-10-CM | POA: Diagnosis not present

## 2018-10-14 DIAGNOSIS — Z7901 Long term (current) use of anticoagulants: Secondary | ICD-10-CM | POA: Diagnosis not present

## 2018-10-14 DIAGNOSIS — F0390 Unspecified dementia without behavioral disturbance: Secondary | ICD-10-CM | POA: Diagnosis not present

## 2018-10-15 DIAGNOSIS — Z7901 Long term (current) use of anticoagulants: Secondary | ICD-10-CM | POA: Diagnosis not present

## 2018-10-15 DIAGNOSIS — I69351 Hemiplegia and hemiparesis following cerebral infarction affecting right dominant side: Secondary | ICD-10-CM | POA: Diagnosis not present

## 2018-10-15 DIAGNOSIS — F0390 Unspecified dementia without behavioral disturbance: Secondary | ICD-10-CM | POA: Diagnosis not present

## 2018-10-15 DIAGNOSIS — I4891 Unspecified atrial fibrillation: Secondary | ICD-10-CM | POA: Diagnosis not present

## 2018-10-16 ENCOUNTER — Telehealth: Payer: Self-pay

## 2018-10-16 DIAGNOSIS — Z7901 Long term (current) use of anticoagulants: Secondary | ICD-10-CM | POA: Diagnosis not present

## 2018-10-16 DIAGNOSIS — I4891 Unspecified atrial fibrillation: Secondary | ICD-10-CM | POA: Diagnosis not present

## 2018-10-16 DIAGNOSIS — I69351 Hemiplegia and hemiparesis following cerebral infarction affecting right dominant side: Secondary | ICD-10-CM | POA: Diagnosis not present

## 2018-10-16 DIAGNOSIS — F0390 Unspecified dementia without behavioral disturbance: Secondary | ICD-10-CM | POA: Diagnosis not present

## 2018-10-16 NOTE — Telephone Encounter (Signed)
I called [facility] at [phone].about pts appt with JEssica stroke NP. I spoke with [facility] office manager about doing video web visit or r/s pt because he is new to our office. Edd Fabian stated [name] Niemczura DON is doing video visit with pt because of the COVID 19. Edd Fabian gave [name]'s  email, and will fax over pts medication list. I also gave web ex for [name] to download on her phone. Consent will be obtained with [name] and to file pts insurance. HEr work hours are 0900am to 0500pm. Appt was change to 1115am. I advise the safety and privacy of pt.

## 2018-10-17 DIAGNOSIS — Z7901 Long term (current) use of anticoagulants: Secondary | ICD-10-CM | POA: Diagnosis not present

## 2018-10-17 DIAGNOSIS — I69351 Hemiplegia and hemiparesis following cerebral infarction affecting right dominant side: Secondary | ICD-10-CM | POA: Diagnosis not present

## 2018-10-17 DIAGNOSIS — I4891 Unspecified atrial fibrillation: Secondary | ICD-10-CM | POA: Diagnosis not present

## 2018-10-17 DIAGNOSIS — F0390 Unspecified dementia without behavioral disturbance: Secondary | ICD-10-CM | POA: Diagnosis not present

## 2018-10-17 NOTE — Telephone Encounter (Signed)
I called [name] at [facility] to get consent. She gave consent for video visit and to file insurance. Adrian confirmed she receive the email about join meeting.

## 2018-10-21 DIAGNOSIS — Z7901 Long term (current) use of anticoagulants: Secondary | ICD-10-CM | POA: Diagnosis not present

## 2018-10-21 DIAGNOSIS — F0390 Unspecified dementia without behavioral disturbance: Secondary | ICD-10-CM | POA: Diagnosis not present

## 2018-10-21 DIAGNOSIS — I69351 Hemiplegia and hemiparesis following cerebral infarction affecting right dominant side: Secondary | ICD-10-CM | POA: Diagnosis not present

## 2018-10-21 DIAGNOSIS — I4891 Unspecified atrial fibrillation: Secondary | ICD-10-CM | POA: Diagnosis not present

## 2018-10-22 DIAGNOSIS — I69351 Hemiplegia and hemiparesis following cerebral infarction affecting right dominant side: Secondary | ICD-10-CM | POA: Diagnosis not present

## 2018-10-22 DIAGNOSIS — F0390 Unspecified dementia without behavioral disturbance: Secondary | ICD-10-CM | POA: Diagnosis not present

## 2018-10-22 DIAGNOSIS — Z7901 Long term (current) use of anticoagulants: Secondary | ICD-10-CM | POA: Diagnosis not present

## 2018-10-22 DIAGNOSIS — I4891 Unspecified atrial fibrillation: Secondary | ICD-10-CM | POA: Diagnosis not present

## 2018-10-22 NOTE — Addendum Note (Signed)
Addended by: Marval Regal on: 10/22/2018 09:45 AM   Modules accepted: Orders

## 2018-10-22 NOTE — Telephone Encounter (Signed)
I receive pts med list from  [facility] and reviewed it. THe med list in the computer is correct.

## 2018-10-23 ENCOUNTER — Other Ambulatory Visit: Payer: Self-pay

## 2018-10-23 ENCOUNTER — Inpatient Hospital Stay: Payer: Self-pay | Admitting: Adult Health

## 2018-10-23 ENCOUNTER — Encounter: Payer: Self-pay | Admitting: Adult Health

## 2018-10-23 ENCOUNTER — Ambulatory Visit (INDEPENDENT_AMBULATORY_CARE_PROVIDER_SITE_OTHER): Payer: Medicare HMO | Admitting: Adult Health

## 2018-10-23 VITALS — BP 126/66 | HR 64 | Temp 98.4°F | Wt 150.0 lb

## 2018-10-23 DIAGNOSIS — I69351 Hemiplegia and hemiparesis following cerebral infarction affecting right dominant side: Secondary | ICD-10-CM | POA: Diagnosis not present

## 2018-10-23 DIAGNOSIS — I4891 Unspecified atrial fibrillation: Secondary | ICD-10-CM | POA: Diagnosis not present

## 2018-10-23 DIAGNOSIS — E785 Hyperlipidemia, unspecified: Secondary | ICD-10-CM | POA: Diagnosis not present

## 2018-10-23 DIAGNOSIS — I10 Essential (primary) hypertension: Secondary | ICD-10-CM | POA: Diagnosis not present

## 2018-10-23 DIAGNOSIS — I63512 Cerebral infarction due to unspecified occlusion or stenosis of left middle cerebral artery: Secondary | ICD-10-CM

## 2018-10-23 DIAGNOSIS — I4821 Permanent atrial fibrillation: Secondary | ICD-10-CM | POA: Diagnosis not present

## 2018-10-23 DIAGNOSIS — F0390 Unspecified dementia without behavioral disturbance: Secondary | ICD-10-CM | POA: Diagnosis not present

## 2018-10-23 DIAGNOSIS — Z7901 Long term (current) use of anticoagulants: Secondary | ICD-10-CM | POA: Diagnosis not present

## 2018-10-23 NOTE — Progress Notes (Signed)
[facility] [address] [place]. [place] [address] [phone]       VIRTUAL VISIT FOLLOW UP NOTE  [name] Date of Birth:  [birth year] Medical Record Number:  [id number]   Reason for Referral:  hospital stroke follow up    Virtual Visit via Video Note  I connected with [name] on 10/23/18 at 11:15 AM EDT by a video enabled telemedicine application located remotely in my own home and verified that I am speaking with the correct person using two identifiers who was located at [facility] and assisted by Director Jeremy Johann.   I discussed the limitations of evaluation and management by telemedicine and the availability of in person appointments. The patient expressed understanding and agreed to proceed.   CHIEF COMPLAINT:  No chief complaint on file.   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 History obtained from patient, [name], [name]) and chart review. Reviewed all radiology images and labs personally.  [name] a 85 y.o.malewith history of A. fib not on anticoagulation x1 year, hygroma, and significant dementia who presented from SNF to [facility] with right-sided weakness. CT head negative for acute abnormality. CTA head showed emergent LVO and left ICA with severe right V4 stenosis.  CTA neck negative for emergent LVO but did show right subclavian artery occlusion.  He received IV TPA and he [facility] and was transferred to [facility] for further treatment and evaluation.  CT perfusion nondiagnostic.  MRI brain reviewed and showed several small acute infarcts involving left pre-and postcentral gyrus, left insula, frontal operculum and medial temporal lobe infarcts.  2D echo showed an EF of  40 to 45% with a bicoronal hypokinesis and left atrium mildly dilated.  Infarcts due to left distal ICA occlusion secondary to known A. fib not on anticoagulation.  Recommended to initiate Eliquis 5 mg twice daily for atrial fibrillation and secondary stroke prevention.  BP stable and recommended long-term BP goal 1 30-1 50 due to left distal ICA occlusion.  LDL 118 and initiated atorvastatin 20 mg daily.  He was discharged back to SNF with recommendations of 24/7 supervision and ongoing therapies on 09/14/2018.  He has been doing well since hospital discharge and has made improvement regarding right-sided weakness.  108 of history is provided by Adrian, SNF director, due to patient's underlying dementia.  He is currently ambulating with rolling walker without any recent falls but goal is for him to ambulate independently without assistive device as he was prior.  He continues to participate in PT/OT/ST and SNF director is requesting approval for additional therapy sessions.  He does have underlying dementia and per SNF staff, this has been stable without any worsening.  It was recommended to be discharged on Eliquis but he is currently on Xarelto 20 mg daily for undetermined reasons (?  Insurance reasons).  Denies any bleeding or bruising on Xarelto dose.  He continues on atorvastatin without side effects myalgias.  Blood pressure obtained during therapy session which was satisfactory at 126/66.  No further concerns at this time.  Denies new or worsening stroke/TIA symptoms.    ROS:   14 system review of systems performed and negative with exception of confusion, memory loss, and weakness  PMH:  Past Medical History:  Diagnosis Date  [name] deficiency 2011   Bladder cancer ([place])    CAD (coronary artery disease)    Hx of colonic polyp    Hyperlipidemia    Memory loss 2011    PSH:  Past Surgical History:  Procedure Laterality Date   CHOLECYSTECTOMY      Social History:  Social History    Socioeconomic History   Marital status: Married    Spouse name: Not on file   Number of children: Not on file   Years of education: Not on file   Highest education level: Not on file  Occupational History   Not on file  Social Needs   Financial resource strain: Not on file   Food insecurity:    Worry: Not on file    Inability: Not on file   Transportation needs:    Medical: Not on file    Non-medical: Not on file  Tobacco Use   Smoking status: Former Smoker    Last attempt to quit: 12/29/1973    Years since quitting: 44.8   Smokeless tobacco: Never Used  Substance and Sexual Activity   Alcohol use: No   Drug use: Not on file   Sexual activity: Not on file  Lifestyle   Physical activity:    Days per week: Not on file    Minutes per session: Not on file   Stress: Not on file  Relationships   Social connections:    Talks on phone: Not on file    Gets together: Not on file    Attends religious service: Not on file    Active member of club or organization: Not on file    Attends meetings of clubs or organizations: Not on file    Relationship status: Not on file   Intimate partner violence:    Fear of current or ex partner: Not on file    Emotionally abused: Not on file    Physically abused: Not on file    Forced sexual activity: Not on file  Other Topics Concern   Not on file  Social History Narrative   Regular Exercise- No    Family History:  Family History  Problem Relation Age of Onset   Coronary artery disease Other    Dementia Other     Medications:   Current Outpatient Medications on File Prior to Visit  Medication Sig Dispense Refill   acetaminophen (TYLENOL) 500 MG tablet Take 500 mg by mouth every 6 (six) hours as needed for mild pain, fever or headache.     atorvastatin (LIPITOR) 20 MG tablet Take 1 tablet (20 mg total) by mouth daily at 6 PM.     rivaroxaban (XARELTO) 20 MG TABS tablet Take 20 mg by mouth daily with supper.      No current facility-administered medications on file prior to visit.     Allergies:  No Known Allergies   Physical Exam  Vitals:   10/23/18 1124  BP: 126/66  Pulse: 64  Temp: 98.4 F (36.9 C)  Weight: 150 lb (68 kg)   Body mass index is 21.52 kg/m. No exam data present  No flowsheet data found.   General: well developed, well nourished, pleasant elderly Caucasian male, seated, in no evident distress Head: head normocephalic and atraumatic.    Neurologic Exam Mental Status: Awake and fully alert. Disoriented to place and time and unable to say his correct age (states 85yo). Recent and remote memory diminished. Attention span, concentration and fund of knowledge diminished. Mood and affect  appropriate and cooperative with exam.  Cranial Nerves:  Extraocular movements full without nystagmus. Hearing intact to voice. Face, tongue, palate moves normally and symmetrically.  Motor: Slight drift noted in right upper extremity, no evidence of drift in lower extremities Sensory.: intact to touch , pinprick , position and vibratory sensation.  Coordination: Decreased right hand finger tapping.  Orbits left arm over right arm. Gait and Station: Deferred Reflexes: UTA   NIHSS  2 Modified Rankin  3    ASSESSMENT: [name] is a 85 y.o. 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 Vascular risk factors include HTN, HLD and atrial fibrillation.     PLAN:  1. Left MCA infarct: Continue Xarelto (rivaroxaban) daily  and atorvastatin 20 mg for secondary stroke prevention. Maintain strict control of hypertension with blood pressure goal below 130/90, diabetes with hemoglobin A1c goal below 6.5% and cholesterol with LDL cholesterol (bad cholesterol) goal below 70 mg/dL.  I also advised the patient to eat a healthy diet with plenty of whole grains, cereals, fruits and vegetables,  exercise regularly with at least 30 minutes of continuous activity daily and maintain ideal body weight. 2. HTN: Advised to continue current treatment regimen.   Advised to continue to monitor at home along with continued follow-up with PCP for management 3. HLD: Advised to continue current treatment regimen along with continued follow-up with PCP for future prescribing and monitoring of lipid panel 4. Atrial fibrillation: Continuation of Xarelto and follow-up with PCP/cardiology for ongoing management 5. Right hemiparesis: Recommend continuation of therapies for ongoing improvement    Follow up in 6 months or call earlier if needed   Greater than 50% of time during this 25 minute visit was spent on counseling, explanation of diagnosis of left MCA infarct, reviewing risk factor management of HTN, HLD and atrial fibrillation, planning of further management along with potential future management, and discussion with patient and family answering all questions.    [name], AGNP-BC  [facility] [address] [place], [place] [address]  Phone [phone] Fax [phone] Note: This document was prepared with digital dictation and possible smart phrase technology. Any transcriptional errors that result from this process are unintentional.

## 2018-10-24 ENCOUNTER — Emergency Department (HOSPITAL_COMMUNITY)
Admission: EM | Admit: 2018-10-24 | Discharge: 2018-10-24 | Disposition: A | Discharge status: Other Acute Inpatient Hospital | Payer: Medicare HMO | Attending: Emergency Medicine | Admitting: Emergency Medicine

## 2018-10-24 ENCOUNTER — Encounter (HOSPITAL_COMMUNITY): Payer: Self-pay | Admitting: *Deleted

## 2018-10-24 ENCOUNTER — Other Ambulatory Visit: Payer: Self-pay

## 2018-10-24 ENCOUNTER — Emergency Department (HOSPITAL_COMMUNITY): Payer: Medicare HMO

## 2018-10-24 DIAGNOSIS — I10 Essential (primary) hypertension: Secondary | ICD-10-CM | POA: Insufficient documentation

## 2018-10-24 DIAGNOSIS — F0390 Unspecified dementia without behavioral disturbance: Secondary | ICD-10-CM | POA: Insufficient documentation

## 2018-10-24 DIAGNOSIS — I4891 Unspecified atrial fibrillation: Secondary | ICD-10-CM | POA: Diagnosis not present

## 2018-10-24 DIAGNOSIS — R55 Syncope and collapse: Secondary | ICD-10-CM | POA: Diagnosis not present

## 2018-10-24 DIAGNOSIS — Z7901 Long term (current) use of anticoagulants: Secondary | ICD-10-CM | POA: Diagnosis not present

## 2018-10-24 DIAGNOSIS — Z87891 Personal history of nicotine dependence: Secondary | ICD-10-CM | POA: Diagnosis not present

## 2018-10-24 DIAGNOSIS — R531 Weakness: Secondary | ICD-10-CM | POA: Diagnosis not present

## 2018-10-24 DIAGNOSIS — I959 Hypotension, unspecified: Secondary | ICD-10-CM | POA: Diagnosis not present

## 2018-10-24 DIAGNOSIS — I69351 Hemiplegia and hemiparesis following cerebral infarction affecting right dominant side: Secondary | ICD-10-CM | POA: Diagnosis not present

## 2018-10-24 LAB — URINALYSIS, ROUTINE W REFLEX MICROSCOPIC
Bilirubin Urine: NEGATIVE
Glucose, UA: NEGATIVE mg/dL
Hgb urine dipstick: NEGATIVE
Ketones, ur: NEGATIVE mg/dL
Leukocytes,Ua: NEGATIVE
Nitrite: NEGATIVE
Protein, ur: NEGATIVE mg/dL
Specific Gravity, Urine: 1.005 (ref 1.005–1.030)
pH: 7 (ref 5.0–8.0)

## 2018-10-24 LAB — CBC
HCT: 38.5 % — ABNORMAL LOW (ref 39.0–52.0)
Hemoglobin: 12.7 g/dL — ABNORMAL LOW (ref 13.0–17.0)
MCH: 32.7 pg (ref 26.0–34.0)
MCHC: 33 g/dL (ref 30.0–36.0)
MCV: 99.2 fL (ref 80.0–100.0)
Platelets: 153 10*3/uL (ref 150–400)
RBC: 3.88 MIL/uL — ABNORMAL LOW (ref 4.22–5.81)
RDW: 13.2 % (ref 11.5–15.5)
WBC: 4.6 10*3/uL (ref 4.0–10.5)
nRBC: 0 % (ref 0.0–0.2)

## 2018-10-24 LAB — BASIC METABOLIC PANEL
Anion gap: 6 (ref 5–15)
BUN: 23 mg/dL (ref 8–23)
CO2: 30 mmol/L (ref 22–32)
Calcium: 9.2 mg/dL (ref 8.9–10.3)
Chloride: 103 mmol/L (ref 98–111)
Creatinine, Ser: 1.09 mg/dL (ref 0.61–1.24)
GFR calc Af Amer: >60 mL/min (ref >60)
GFR calc non Af Amer: >60 mL/min (ref >60)
Glucose, Bld: 138 mg/dL — ABNORMAL HIGH (ref 70–99)
Potassium: 4 mmol/L (ref 3.5–5.1)
Sodium: 139 mmol/L (ref 135–145)

## 2018-10-24 LAB — TROPONIN I: Troponin I: <0.03 ng/mL (ref <0.03)

## 2018-10-24 LAB — PROTIME-INR
INR: 1.4 — ABNORMAL HIGH (ref 0.8–1.2)
Prothrombin Time: 17.1 seconds — ABNORMAL HIGH (ref 11.4–15.2)

## 2018-10-24 LAB — LACTIC ACID, PLASMA: Lactic Acid, Venous: 1.4 mmol/L (ref 0.5–1.9)

## 2018-10-24 MED ORDER — SODIUM CHLORIDE 0.9 % IV BOLUS
500.0000 mL | Freq: Once | INTRAVENOUS | Status: AC
  Administered 2018-10-24: 13:00:00 500 mL via INTRAVENOUS

## 2018-10-24 NOTE — ED Triage Notes (Signed)
Patient nursing home [facility] called EMS for "hypotension and weakness". Patient is alert and oriented upon arrival.  Per EMS, blood pressure within normal limits. CBG 116.

## 2018-10-24 NOTE — ED Notes (Signed)
Patient able to ambulate around room with no assistance.  Denies dizziness or short of breath.

## 2018-10-24 NOTE — ED Notes (Signed)
Report given to staff at [facility] [phone].  Staff will pick up patient.

## 2018-10-24 NOTE — ED Notes (Signed)
Pt advised urine sample is needed. Urinal at bedside. Pt has call light.

## 2018-10-24 NOTE — ED Provider Notes (Addendum)
[facility] Emergency Department Provider Note MRN:  [id number]  Arrival date & time: 10/24/18     Chief Complaint   Hypotension   History of Present Illness   [name] is a 85 y.o. year-old male with a history of hypertension, stroke, A. fib, dementia presenting to the ED with chief complaint of hypotension.  Patient explains that he has been feeling well yesterday and to this morning.  Was eating lunch when he is told that he began looking and feeling very tired.  Was reportedly "falling out" during lunch.  EMS was called and they found the patient to be hypotensive with systolics in the 47Q.  Patient endorsing feeling generally weak, denies focal weakness, denies fever or cough, no chest pain or shortness of breath, no abdominal pain.  Symptoms are constant, no exacerbating relieving factors.  Review of Systems  A complete 10 system review of systems was obtained and all systems are negative except as noted in the HPI and PMH.   Patient's Health History    Past Medical History:  Diagnosis Date  . [name] deficiency 2011  . Bladder cancer ([place])   . CAD (coronary artery disease)   . Hx of colonic polyp   . Hyperlipidemia   . Memory loss 2011    Past Surgical History:  Procedure Laterality Date  . CHOLECYSTECTOMY      Family History  Problem Relation Age of Onset  . Coronary artery disease Other   . Dementia Other     Social History   Socioeconomic History  . Marital status: Married    Spouse name: Not on file  . Number of children: Not on file  . Years of education: Not on file  . Highest education level: Not on file  Occupational History  . Not on file  Social Needs  . Financial resource strain: Not on file  . Food insecurity:    Worry: Not on file    Inability: Not on file  . Transportation needs:    Medical: Not on file    Non-medical: Not on file  Tobacco Use  . Smoking status: Former Smoker    Last attempt to quit: 12/29/1973   Years since quitting: 44.8  . Smokeless tobacco: Never Used  Substance and Sexual Activity  . Alcohol use: No  . Drug use: Not on file  . Sexual activity: Not on file  Lifestyle  . Physical activity:    Days per week: Not on file    Minutes per session: Not on file  . Stress: Not on file  Relationships  . Social connections:    Talks on phone: Not on file    Gets together: Not on file    Attends religious service: Not on file    Active member of club or organization: Not on file    Attends meetings of clubs or organizations: Not on file    Relationship status: Not on file  . Intimate partner violence:    Fear of current or ex partner: Not on file    Emotionally abused: Not on file    Physically abused: Not on file    Forced sexual activity: Not on file  Other Topics Concern  . Not on file  Social History Narrative   Regular Exercise- No     Physical Exam  Vital Signs and Nursing Notes reviewed Vitals:   10/24/18 1430 10/24/18 1445  BP: 106/61   Pulse: 63 60  Resp: 19 16  Temp:    SpO2: 98% 99%    CONSTITUTIONAL: Well-appearing, NAD NEURO:  Alert and oriented x 3, subtle decreased strength to right arm and right leg, baseline per patient EYES:  eyes equal and reactive ENT/NECK:  no LAD, no JVD CARDIO: Regular rate, well-perfused, normal S1 and S2 PULM:  CTAB no wheezing or rhonchi GI/GU:  normal bowel sounds, non-distended, non-tender MSK/SPINE:  No gross deformities, no edema SKIN:  no rash, atraumatic PSYCH:  Appropriate speech and behavior  Diagnostic and Interventional Summary    EKG Interpretation  Date/Time:  Thursday October 24 2018 13:00:07 EDT Ventricular Rate:  74 PR Interval:    QRS Duration: 99 QT Interval:  394 QTC Calculation: 438 R Axis:   102 Text Interpretation:  Atrial fibrillation Anterior infarct, old Nonspecific T abnormalities, lateral leads Confirmed by Gerlene Fee [phone]) on 10/24/2018 1:13:48 PM      Labs Reviewed  CBC -  Abnormal; Notable for the following components:      Result Value   RBC 3.88 (*)    Hemoglobin 12.7 (*)    HCT 38.5 (*)    All other components within normal limits  BASIC METABOLIC PANEL - Abnormal; Notable for the following components:   Glucose, Bld 138 (*)    All other components within normal limits  URINALYSIS, ROUTINE W REFLEX MICROSCOPIC - Abnormal; Notable for the following components:   Color, Urine STRAW (*)    All other components within normal limits  PROTIME-INR - Abnormal; Notable for the following components:   Prothrombin Time 17.1 (*)    INR 1.4 (*)    All other components within normal limits  TROPONIN I  LACTIC ACID, PLASMA    DG Chest Port 1 View  Final Result      Medications  sodium chloride 0.9 % bolus 500 mL (0 mLs Intravenous Stopped 10/24/18 1450)     Procedures Critical Care  ED Course and Medical Decision Making  I have reviewed the triage vital signs and the nursing notes.  Pertinent labs & imaging results that were available during my care of the patient were reviewed by me and considered in my medical decision making (see below for details).  Reported syncope with hypotension in this 85 year old male with extended list of comorbidities.  Patient is with normal vital signs here in the emergency department, afebrile, generally well-appearing, soft abdomen, seems to have his baseline neurological exam (residual right-sided deficits from prior stroke).  Considering metabolic disarray, occult infection, cardiac arrhythmia.  Placed on cardiac monitoring.  EKG without ischemic concerns.  Work-up pending.  Clinical Course as of Oct 24 1539  Thu Oct 24, 2018  1532 Patient continues to have normal vital signs here in the emergency department, no abnormal activity on cardiac monitoring.  Labs are normal and reassuring, normal chest x-ray, normal urinalysis.  After 500 cc IV fluids, patient was able to ambulate in the room without assistance, did very well,  orthostatic vital signs were also taken and he was not orthostatic.     [MB]  6286 Upon further chart review it is revealed that patient's recent echocardiogram revealed a fairly reduced ejection fraction.  Given this evidence of structural heart disease/congestive heart disease, this would qualify as high risk syncope and warrant admission for observation.   [MB]    Clinical Course User Index [MB] Maudie Flakes, MD    Accepted for admission by hospitalist service.  4:47 PM Update: Evaluated by hospitalist service, and the hospitalist was able  to gather more information from the skilled nursing facility.  Apparently there was no syncopal episode, blood pressures measured at the facility were low but were normal with EMS.  Given the lack of syncope or presyncope in the recent admission and recent echo, there would be little benefit to admission at this time.  There would likely be greater risk of admission giving the coronavirus pandemic.  Appropriate for discharge.  Legal guardian informed.  Barth Kirks. Sedonia Small, [place] [email or web address]  Final Clinical Impressions(s) / ED Diagnoses     ICD-10-CM   1. Syncope R55 DG Chest [facility] 1 View    DG Chest [place] 1 View    ED Discharge Orders    None         Maudie Flakes, MD 10/24/18 1601    Maudie Flakes, MD 10/24/18 [phone]

## 2018-10-24 NOTE — Discharge Instructions (Addendum)
You were evaluated in the Emergency Department and after careful evaluation, we did not find any emergent condition requiring admission or further testing in the hospital. ° °Please return to the Emergency Department if you experience any worsening of your condition.  We encourage you to follow up with a primary care provider.  Thank you for allowing us to be a part of your care. °

## 2018-10-24 NOTE — Consult Note (Signed)
Medical Consultation   [name]  IHW:[id number]  DOB: Sep 07, 1929  DOA: 10/24/2018  PCP: Rosita Fire, MD   Outpatient Specialists: None  Requesting physician: Dr. Sedonia Small  Reason for consultation: Low blood pressure  History of Present Illness: [name] is an 85 y.o. male history of coronary artery disease, dementia, atrial fibrillation and recent CVA who presented was brought to the ED via EMS with reports of low blood pressure.  Per triage notes there was it was reported that blood pressure systolic was in the 49Z, and patient was weak so EMS was called.  Does also report of patient " falling out".   I called [facility] to get details-and talked to patient's nurse.  She has a history of dementia, and he is not DNR.  There are no sick/COVID patients in their facility. She tells me patient has no complaints, has been doing okay. Today patient was about to work with physical therapy just before lunch, patient appeared a bit weak, he had just gotten up from bed- he normally gets up late in the morning, about this time, just before lunch.  So physical therapist checked his blood pressure and recorded a systolic of 70.  EMS was called but EMS got in normal blood pressure reading.  EMS brought patient to the ED.  Patient's nurse tells me that patient never passed out and there was no reports of passing out or near passing out episodes.  Patient was awake and alert the whole time.  Patient cannot exactly remember details of this morning.  He denies chest pain or difficulty breathing.  He denies dizziness.  No headache.  No sore throat or cough.  No loose stools, vomiting.  In the ED patient was able to ambulate in the room with no assistance, no dizziness or difficulty breathing.  Heart rate 57s to 73s, blood pressure systolic 791- 505.  Orthostatic vitals were also checked lying 123/58 pulse 62, standing 111/59 pulse 77.  Troponin , CBC, BMP, lactic  acid all unremarkable, UA-unremarkable.  EKG shows atrial fibrillation, some spikes ?? suggesting pacing.  Port Chest x-ray -negative for acute abnormality.  500 mill bolus was given in ED. Hospitalist was consulted for admission for syncope.  Review of Systems:  As per HPI otherwise 10 point review of systems negative.   Past Medical History: Past Medical History:  Diagnosis Date  . [name] deficiency 2011  . Bladder cancer ([place])   . CAD (coronary artery disease)   . Hx of colonic polyp   . Hyperlipidemia   . Memory loss 2011    Past Surgical History: Past Surgical History:  Procedure Laterality Date  . CHOLECYSTECTOMY      Allergies:  No Known Allergies   Social History:  reports that he quit smoking about 44 years ago. He has never used smokeless tobacco. He reports that he does not drink alcohol. No history on file for drug.   Family History: Family History  Problem Relation Age of Onset  . Coronary artery disease Other   . Dementia Other     Physical Exam: Vitals:   10/24/18 1445 10/24/18 1500 10/24/18 1530 10/24/18 1545  BP:  (!) 103/54 (!) 101/55   Pulse: 60 64 (!) 59 63  Resp: 16 13 14 15   Temp:      TempSrc:      SpO2: 99% 98% 100% 99%  Weight:  Height:        Constitutional: Alert and awake, oriented x3, not in any acute distress. Eyes: PERLA, EOMI, irises appear normal, anicteric sclera,  ENMT: external ears and nose appear normal,             Lips appears normal, oropharynx mucosa, tongue, posterior pharynx appear normal  Neck: neck appears normal, no masses, normal ROM, no thyromegaly, no JVD  CVS:  no LE edema, normal pedal pulses  Respiratory:  clear to auscultation bilaterally, no wheezing, rales or rhonchi. Respiratory effort normal. No accessory muscle use.  Abdomen: soft nontender, nondistended, normal bowel sounds, no hepatosplenomegaly, no hernias  Musculoskeletal: : no cyanosis, clubbing or edema noted bilaterally                      Neuro: Strenght extremities 4+/5, intact sensation. Psych: judgement and insight appear normal, stable mood and affect, mental status Skin: no rashes or lesions or ulcers, no induration or nodules   Data reviewed:  I have personally reviewed following labs and imaging studies Labs:  CBC: Recent Labs  Lab 10/24/18 1315  WBC 4.6  HGB 12.7*  HCT 38.5*  MCV 99.2  PLT 174    Basic Metabolic Panel: Recent Labs  Lab 10/24/18 1315  NA 139  K 4.0  CL 103  CO2 30  GLUCOSE 138*  BUN 23  CREATININE 1.09  CALCIUM 9.2   Coagulation profile Recent Labs  Lab 10/24/18 1315  INR 1.4*    Cardiac Enzymes: Recent Labs  Lab 10/24/18 1315  TROPONINI <0.03   Urinalysis    Component Value Date/Time   COLORURINE STRAW (A) 10/24/2018 1255   APPEARANCEUR CLEAR 10/24/2018 1255   LABSPEC 1.005 10/24/2018 [place] 7.0 10/24/2018 [place] 10/24/2018 [place] 01/27/2010 1221   [place] 10/24/2018 [place] 10/24/2018 [place] 10/24/2018 1255   PROTEINUR NEGATIVE 10/24/2018 1255   UROBILINOGEN 1.0 01/27/2010 1221   NITRITE NEGATIVE 10/24/2018 [place] 10/24/2018 1255     Microbiology No results found for this or any previous visit (from the past 240 hour(s)).   Inpatient Medications:   Scheduled Meds: Continuous Infusions:   Radiological Exams on Admission: Dg Chest Port 1 View  Result Date: 10/24/2018 CLINICAL DATA:  Weakness and hypotension. EXAM: PORTABLE CHEST 1 VIEW COMPARISON:  12/18/2008 FINDINGS: Heart size upper limits of normal. Chronic aortic atherosclerosis. The lungs are clear. The vascularity is normal. No effusions. No acute bone finding. IMPRESSION: No active disease.  Chronic aortic atherosclerosis. Electronically Signed   By: Nelson Chimes M.D.   On: 10/24/2018 13:22    Impression/Recommendations Hospitalist called to admit for syncope.  I talked to  patient's nurse who witnessed the whole event and reported that there was no reports of syncope or near syncopal episodes.  Patient's had had no complaints, has been doing well without history to suggest fluid loss.  No Covid patients at their facility.  Blood pressure systolic 70 was recorded by physical therapist and was probably an error, or blood pressure was not measured correctly.  On EMS arrival blood pressure was normal. Vitals all stable here with negative orthostasis.  Blood work, imaging EKG all unremarkable.  Patient able to ambulate in the ED without problems no dizziness. At this time I do not see see an indication for admission.  Patient had recent CVA and echo was done 09/10/18-40 to  45%, indeterminate diastolic parameters, severely dilated right atrium, mild aortic and pulmonary valve regurgitation. ED provider to determine disposition.  Recommend continued hydration, regular blood pressure check and follow-up with primary care provider.   Bethena Roys M.D. [facility] 10/24/2018, 6:35 PM

## 2018-10-24 NOTE — Progress Notes (Signed)
I agree with the above plan 

## 2018-10-28 DIAGNOSIS — I69351 Hemiplegia and hemiparesis following cerebral infarction affecting right dominant side: Secondary | ICD-10-CM | POA: Diagnosis not present

## 2018-10-28 DIAGNOSIS — F0390 Unspecified dementia without behavioral disturbance: Secondary | ICD-10-CM | POA: Diagnosis not present

## 2018-10-28 DIAGNOSIS — Z7901 Long term (current) use of anticoagulants: Secondary | ICD-10-CM | POA: Diagnosis not present

## 2018-10-28 DIAGNOSIS — I4891 Unspecified atrial fibrillation: Secondary | ICD-10-CM | POA: Diagnosis not present

## 2018-10-30 DIAGNOSIS — F0390 Unspecified dementia without behavioral disturbance: Secondary | ICD-10-CM | POA: Diagnosis not present

## 2018-10-30 DIAGNOSIS — Z7901 Long term (current) use of anticoagulants: Secondary | ICD-10-CM | POA: Diagnosis not present

## 2018-10-30 DIAGNOSIS — I4891 Unspecified atrial fibrillation: Secondary | ICD-10-CM | POA: Diagnosis not present

## 2018-10-30 DIAGNOSIS — I69351 Hemiplegia and hemiparesis following cerebral infarction affecting right dominant side: Secondary | ICD-10-CM | POA: Diagnosis not present

## 2018-11-01 DIAGNOSIS — F0390 Unspecified dementia without behavioral disturbance: Secondary | ICD-10-CM | POA: Diagnosis not present

## 2018-11-01 DIAGNOSIS — G301 Alzheimer's disease with late onset: Secondary | ICD-10-CM | POA: Diagnosis not present

## 2018-11-01 DIAGNOSIS — I4891 Unspecified atrial fibrillation: Secondary | ICD-10-CM | POA: Diagnosis not present

## 2018-11-01 DIAGNOSIS — Z7901 Long term (current) use of anticoagulants: Secondary | ICD-10-CM | POA: Diagnosis not present

## 2018-11-01 DIAGNOSIS — I951 Orthostatic hypotension: Secondary | ICD-10-CM | POA: Diagnosis not present

## 2018-11-01 DIAGNOSIS — I69351 Hemiplegia and hemiparesis following cerebral infarction affecting right dominant side: Secondary | ICD-10-CM | POA: Diagnosis not present

## 2018-11-04 DIAGNOSIS — F0390 Unspecified dementia without behavioral disturbance: Secondary | ICD-10-CM | POA: Diagnosis not present

## 2018-11-04 DIAGNOSIS — I4891 Unspecified atrial fibrillation: Secondary | ICD-10-CM | POA: Diagnosis not present

## 2018-11-04 DIAGNOSIS — I69351 Hemiplegia and hemiparesis following cerebral infarction affecting right dominant side: Secondary | ICD-10-CM | POA: Diagnosis not present

## 2018-11-04 DIAGNOSIS — Z7901 Long term (current) use of anticoagulants: Secondary | ICD-10-CM | POA: Diagnosis not present

## 2018-11-05 ENCOUNTER — Other Ambulatory Visit: Payer: Self-pay

## 2018-11-05 ENCOUNTER — Encounter (HOSPITAL_COMMUNITY): Payer: Self-pay | Admitting: Emergency Medicine

## 2018-11-05 ENCOUNTER — Emergency Department (HOSPITAL_COMMUNITY)
Admission: EM | Admit: 2018-11-05 | Discharge: 2018-11-05 | Disposition: A | Discharge status: Home / Self Care | Payer: Medicare HMO | Attending: Emergency Medicine | Admitting: Emergency Medicine

## 2018-11-05 ENCOUNTER — Emergency Department (HOSPITAL_COMMUNITY): Payer: Medicare HMO

## 2018-11-05 DIAGNOSIS — E785 Hyperlipidemia, unspecified: Secondary | ICD-10-CM | POA: Diagnosis not present

## 2018-11-05 DIAGNOSIS — I4891 Unspecified atrial fibrillation: Secondary | ICD-10-CM | POA: Diagnosis not present

## 2018-11-05 DIAGNOSIS — R531 Weakness: Secondary | ICD-10-CM | POA: Diagnosis not present

## 2018-11-05 DIAGNOSIS — Z8673 Personal history of transient ischemic attack (TIA), and cerebral infarction without residual deficits: Secondary | ICD-10-CM | POA: Diagnosis not present

## 2018-11-05 DIAGNOSIS — R410 Disorientation, unspecified: Secondary | ICD-10-CM | POA: Diagnosis not present

## 2018-11-05 DIAGNOSIS — J9811 Atelectasis: Secondary | ICD-10-CM | POA: Diagnosis not present

## 2018-11-05 DIAGNOSIS — I2510 Atherosclerotic heart disease of native coronary artery without angina pectoris: Secondary | ICD-10-CM | POA: Insufficient documentation

## 2018-11-05 DIAGNOSIS — Z5321 Procedure and treatment not carried out due to patient leaving prior to being seen by health care provider: Secondary | ICD-10-CM | POA: Insufficient documentation

## 2018-11-05 DIAGNOSIS — R0689 Other abnormalities of breathing: Secondary | ICD-10-CM | POA: Diagnosis not present

## 2018-11-05 DIAGNOSIS — R0902 Hypoxemia: Secondary | ICD-10-CM | POA: Diagnosis not present

## 2018-11-05 DIAGNOSIS — I959 Hypotension, unspecified: Secondary | ICD-10-CM | POA: Diagnosis not present

## 2018-11-05 LAB — CBC WITH DIFFERENTIAL/PLATELET
Abs Immature Granulocytes: 0.01 10*3/uL (ref 0.00–0.07)
Basophils Absolute: 0 10*3/uL (ref 0.0–0.1)
Basophils Relative: 1 %
Eosinophils Absolute: 0.1 10*3/uL (ref 0.0–0.5)
Eosinophils Relative: 3 %
HCT: 37 % — ABNORMAL LOW (ref 39.0–52.0)
Hemoglobin: 12.4 g/dL — ABNORMAL LOW (ref 13.0–17.0)
Immature Granulocytes: 0 %
Lymphocytes Relative: 17 %
Lymphs Abs: 0.7 10*3/uL (ref 0.7–4.0)
MCH: 33 pg (ref 26.0–34.0)
MCHC: 33.5 g/dL (ref 30.0–36.0)
MCV: 98.4 fL (ref 80.0–100.0)
Monocytes Absolute: 0.4 10*3/uL (ref 0.1–1.0)
Monocytes Relative: 9 %
Neutro Abs: 3 10*3/uL (ref 1.7–7.7)
Neutrophils Relative %: 70 %
Platelets: 145 10*3/uL — ABNORMAL LOW (ref 150–400)
RBC: 3.76 MIL/uL — ABNORMAL LOW (ref 4.22–5.81)
RDW: 13.3 % (ref 11.5–15.5)
WBC: 4.3 10*3/uL (ref 4.0–10.5)
nRBC: 0 % (ref 0.0–0.2)

## 2018-11-05 LAB — COMPREHENSIVE METABOLIC PANEL
ALT: 9 U/L (ref 0–44)
AST: 20 U/L (ref 15–41)
Albumin: 3.7 g/dL (ref 3.5–5.0)
Alkaline Phosphatase: 67 U/L (ref 38–126)
Anion gap: 6 (ref 5–15)
BUN: 19 mg/dL (ref 8–23)
CO2: 30 mmol/L (ref 22–32)
Calcium: 9.2 mg/dL (ref 8.9–10.3)
Chloride: 103 mmol/L (ref 98–111)
Creatinine, Ser: 1.1 mg/dL (ref 0.61–1.24)
GFR calc Af Amer: >60 mL/min (ref >60)
GFR calc non Af Amer: >60 mL/min (ref >60)
Glucose, Bld: 104 mg/dL — ABNORMAL HIGH (ref 70–99)
Potassium: 4.3 mmol/L (ref 3.5–5.1)
Sodium: 139 mmol/L (ref 135–145)
Total Bilirubin: 0.9 mg/dL (ref 0.3–1.2)
Total Protein: 6.3 g/dL — ABNORMAL LOW (ref 6.5–8.1)

## 2018-11-05 LAB — URINALYSIS, ROUTINE W REFLEX MICROSCOPIC
Bilirubin Urine: NEGATIVE
Glucose, UA: NEGATIVE mg/dL
Hgb urine dipstick: NEGATIVE
Ketones, ur: NEGATIVE mg/dL
Leukocytes,Ua: NEGATIVE
Nitrite: NEGATIVE
Protein, ur: NEGATIVE mg/dL
Specific Gravity, Urine: 1.006 (ref 1.005–1.030)
pH: 7 (ref 5.0–8.0)

## 2018-11-05 LAB — LACTIC ACID, PLASMA
Lactic Acid, Venous: 1 mmol/L (ref 0.5–1.9)
Lactic Acid, Venous: 0.9 mmol/L (ref 0.5–1.9)

## 2018-11-05 LAB — TROPONIN I: Troponin I: <0.03 ng/mL (ref <0.03)

## 2018-11-05 MED ORDER — SODIUM CHLORIDE 0.9 % IV BOLUS (SEPSIS)
500.0000 mL | Freq: Once | INTRAVENOUS | Status: AC
  Administered 2018-11-05: 500 mL via INTRAVENOUS

## 2018-11-05 MED ORDER — SODIUM CHLORIDE 0.9 % IV SOLN
1000.0000 mL | INTRAVENOUS | Status: DC
  Administered 2018-11-05: 11:00:00 1000 mL via INTRAVENOUS

## 2018-11-05 NOTE — ED Provider Notes (Signed)
[facility] EMERGENCY DEPARTMENT Provider Note   CSN: [id number] Arrival date & time: 11/05/18  8242    History   Chief Complaint Chief Complaint  Patient presents with  . Hypotension  . Weakness    HPI [name] is a 85 y.o. male.     Patient is an 85 year old male with a history of bladder cancer, coronary artery disease, vertigo, cerebrovascular accident requiring TPA, and atrial fibrillation.  Patient is a resident of the [facility].  During there vital sign assessment this morning, they noted that the blood pressure patient's blood pressure was low, and the patient c/o being weaker than usual.  They then  made arrangements to have the patient brought to the emergency department.  The patient is able to speak and give some history.  He says that he has low blood pressure from time to time.  And he has weakness frequently.  He says that he got up this morning and he ate breakfast.  He walk to the eating area using his walker.  And he gives history that when his blood pressure and vital signs were checked he was noted to have low blood pressure and was sent to the emergency department.  He denies headache, vision changes, changesin speech, changes in his use of his upper or lower extremities.  No reported loss of conscious from the EMS team or the nursing center. He reports nausea, but no vomiting.  The history is provided by the patient.  Weakness  Associated symptoms: nausea   Associated symptoms: no abdominal pain, no arthralgias, no chest pain, no cough, no dizziness, no dysuria, no fever, no frequency, no headaches, no seizures and no shortness of breath     Past Medical History:  Diagnosis Date  . [name] deficiency 2011  . Bladder cancer ([place])   . CAD (coronary artery disease)   . Hx of colonic polyp   . Hyperlipidemia   . Memory loss 2011    Patient Active Problem List   Diagnosis Date Noted  . Hygroma 09/10/2018  . Stroke (cerebrum)  ([place]) left brain infarct status post TPA 09/09/2018  . Chest pain 12/29/2013  . Atrial fibrillation ([place]) 12/29/2013  . VERTIGO 02/04/2010  . [name] DEFICIENCY 01/31/2010  . Memory loss 01/27/2010  . PARESTHESIA 01/27/2010  . Hyperlipidemia 11/02/2009  . CORONARY ARTERY DISEASE 11/02/2009  . SINUSITIS, ACUTE 11/02/2009  . EPISTAXIS 11/02/2009  . COLONIC POLYPS, HX OF 11/02/2009    Past Surgical History:  Procedure Laterality Date  . CHOLECYSTECTOMY          Home Medications    Prior to Admission medications   Medication Sig Start Date End Date Taking? Authorizing Provider  acetaminophen (TYLENOL) 500 MG tablet Take 500 mg by mouth every 6 (six) hours as needed for mild pain, fever or headache.    [provider]  atorvastatin (LIPITOR) 20 MG tablet Take 1 tablet (20 mg total) by mouth daily at 6 PM. 09/14/18   Rinehuls, Early Chars, PA-C  rivaroxaban (XARELTO) 20 MG TABS tablet Take 20 mg by mouth daily with supper.    [provider]    Family History Family History  Problem Relation Age of Onset  . Coronary artery disease Other   . Dementia Other     Social History Social History   Tobacco Use  . Smoking status: Former Smoker    Last attempt to quit: 12/29/1973    Years since quitting: 44.8  . Smokeless tobacco:  Never Used  Substance Use Topics  . Alcohol use: No  . Drug use: Not on file     Allergies   Patient has no known allergies.   Review of Systems Review of Systems  Constitutional: Positive for appetite change. Negative for activity change, chills and fever.       All ROS Neg except as noted in HPI  HENT: Negative for congestion and nosebleeds.   Eyes: Negative for photophobia and discharge.  Respiratory: Negative for cough, shortness of breath and wheezing.   Cardiovascular: Negative for chest pain and palpitations.  Gastrointestinal: Positive for nausea. Negative for abdominal pain and blood in stool.  Genitourinary: Negative for  dysuria, frequency and hematuria.  Musculoskeletal: Negative for arthralgias, back pain and neck pain.  Skin: Negative.   Neurological: Positive for weakness. Negative for dizziness, seizures, speech difficulty, light-headedness and headaches.  Psychiatric/Behavioral: Negative for confusion and hallucinations.     Physical Exam Updated Vital Signs BP 124/68 (BP Location: Left Arm)   Pulse 70   Temp 97.7 F (36.5 C) (Oral)   Resp 18   SpO2 100%   Physical Exam Vitals signs and nursing note reviewed.  Constitutional:      Appearance: He is well-developed. He is not toxic-appearing.  HENT:     Head: Normocephalic.     Right Ear: Tympanic membrane and external ear normal.     Left Ear: Tympanic membrane and external ear normal.  Eyes:     General: Lids are normal.     Pupils: Pupils are equal, round, and reactive to light.  Neck:     Musculoskeletal: Normal range of motion and neck supple.     Vascular: No carotid bruit.  Cardiovascular:     Rate and Rhythm: Normal rate. Rhythm regularly irregular.     Pulses: Normal pulses.     Heart sounds: Normal heart sounds.  Pulmonary:     Effort: No respiratory distress.     Breath sounds: Normal breath sounds.  Abdominal:     General: Bowel sounds are normal.     Palpations: Abdomen is soft.     Tenderness: There is no abdominal tenderness. There is no guarding.  Musculoskeletal: Normal range of motion.     Comments: Pain with attempted ROM of the right lower extremity. This is not new. No edema.  Lymphadenopathy:     Head:     Right side of head: No submandibular adenopathy.     Left side of head: No submandibular adenopathy.     Cervical: No cervical adenopathy.  Skin:    General: Skin is warm and dry.  Neurological:     Mental Status: He is alert and oriented to person, place, and time.     Cranial Nerves: No cranial nerve deficit.     Sensory: No sensory deficit.     Comments: Mild to mod weakness of the right upper and  lower ext compared to the left. No flaccid areas.  Psychiatric:        Speech: Speech normal.      ED Treatments / Results  Labs (all labs ordered are listed, but only abnormal results are displayed) Labs Reviewed  COMPREHENSIVE METABOLIC PANEL  TROPONIN I  LACTIC ACID, PLASMA  LACTIC ACID, PLASMA  CBC WITH DIFFERENTIAL/PLATELET  URINALYSIS, ROUTINE W REFLEX MICROSCOPIC    EKG None  Radiology No results found.  Procedures Procedures (including critical care time)  Medications Ordered in ED Medications  sodium chloride 0.9 % bolus 500  mL (has no administration in time range)    Followed by  0.9 %  sodium chloride infusion (has no administration in time range)     Initial Impression / Assessment and Plan / ED Course  I have reviewed the triage vital signs and the nursing notes.  Pertinent labs & imaging results that were available during my care of the patient were reviewed by me and considered in my medical decision making (see chart for details).         Final Clinical Impressions(s) / ED Diagnoses MDM Pt seen with me by Dr Lacinda Axon  Patient is awake and alert and in no distress.  Pulse oximetry is 98 to 99% on room air.  Within normal limits by my interpretation.  The comprehensive metabolic panel is within normal limits.  Troponin is negative for acute cardiac event.  Electrocardiogram is negative for acute STEMI, high degree blocks, or life-threatening arrhythmias.  The lactic acid is normal at 1.  Complete blood count is nonacute.  Urine analysis is negative for infection. Chest x-ray is negative for pneumonia, mass, or other acute problems.  CT scan of the head shows some atrophy and some ischemic changes in the deep cerebral white matter, but no acute problems.  The patient received a mild IV fluid bolus.  The patient's blood pressure has been in the stable range since being here in the emergency department.  Case discussed with legal guardian - C.  Bobette Mo. Patient to be returned to the nursing facility.  He is to be evaluated by his primary physician or return to the emergency department if any changes in condition, problems, or concerns.   Final diagnoses:  Weakness    ED Discharge Orders    None       Lily Kocher, PA-C 11/06/18 [id number]    Nat Christen, MD 11/08/18 Lurline Hare

## 2018-11-05 NOTE — ED Triage Notes (Signed)
Pt called out from [facility] for hypotension. EMS got 133/76. Pt c/o waking with generalized weakness. Hx of dementia. A/o to [facility]. cbg 131. Pt arrived a/o to [facility]. Mild generalized weakness noted.

## 2018-11-05 NOTE — ED Notes (Signed)
Patient transported to CT 

## 2018-11-05 NOTE — ED Notes (Signed)
Lab at bedside

## 2018-11-05 NOTE — ED Notes (Signed)
[facility] arrived out front to transport pt home. Pt taken to vehicle in wheelchair by NT. D/C paperwork and paperwork from facility sent with pt.

## 2018-11-05 NOTE — Discharge Instructions (Addendum)
Your blood pressure has been well maintained throughout your visit here in the emergency department.  Your heart rate has been stable.  Your oxygen level is been 98% on room air without problem.  Your complete blood count and comprehensive metabolic panel are negative for acute problems.  Your heart enzyme is negative for acute problems.  CT scan of your head is negative for acute injury or acute event.  Your chest x-ray is negative.  Please increase fluids.  Please use caution changing positions from sitting to standing or sent standing to lying down.  Please see your primary physician or return to the emergency department if any changes in your condition, problems, or concerns.

## 2018-11-05 NOTE — ED Notes (Signed)
Called pt's legal guardian, Marinda Elk at [phone] ext [phone] and updated on pt's disposition. Gave report to [name] at [facility]. SNF to send transport home.

## 2018-11-05 NOTE — ED Notes (Signed)
ED Provider at bedside. 

## 2018-11-06 DIAGNOSIS — Z7901 Long term (current) use of anticoagulants: Secondary | ICD-10-CM | POA: Diagnosis not present

## 2018-11-06 DIAGNOSIS — I4891 Unspecified atrial fibrillation: Secondary | ICD-10-CM | POA: Diagnosis not present

## 2018-11-06 DIAGNOSIS — F0390 Unspecified dementia without behavioral disturbance: Secondary | ICD-10-CM | POA: Diagnosis not present

## 2018-11-06 DIAGNOSIS — I69351 Hemiplegia and hemiparesis following cerebral infarction affecting right dominant side: Secondary | ICD-10-CM | POA: Diagnosis not present

## 2018-11-08 DIAGNOSIS — G301 Alzheimer's disease with late onset: Secondary | ICD-10-CM | POA: Diagnosis not present

## 2018-11-08 DIAGNOSIS — I480 Paroxysmal atrial fibrillation: Secondary | ICD-10-CM | POA: Diagnosis not present

## 2018-11-08 DIAGNOSIS — I4891 Unspecified atrial fibrillation: Secondary | ICD-10-CM | POA: Diagnosis not present

## 2018-11-08 DIAGNOSIS — Z7901 Long term (current) use of anticoagulants: Secondary | ICD-10-CM | POA: Diagnosis not present

## 2018-11-08 DIAGNOSIS — I69321 Dysphasia following cerebral infarction: Secondary | ICD-10-CM | POA: Diagnosis not present

## 2018-11-08 DIAGNOSIS — I69391 Dysphagia following cerebral infarction: Secondary | ICD-10-CM | POA: Diagnosis not present

## 2018-11-08 DIAGNOSIS — F0390 Unspecified dementia without behavioral disturbance: Secondary | ICD-10-CM | POA: Diagnosis not present

## 2018-11-08 DIAGNOSIS — I951 Orthostatic hypotension: Secondary | ICD-10-CM | POA: Diagnosis not present

## 2018-11-08 DIAGNOSIS — R1312 Dysphagia, oropharyngeal phase: Secondary | ICD-10-CM | POA: Diagnosis not present

## 2018-11-08 DIAGNOSIS — I69351 Hemiplegia and hemiparesis following cerebral infarction affecting right dominant side: Secondary | ICD-10-CM | POA: Diagnosis not present

## 2018-11-08 DIAGNOSIS — I959 Hypotension, unspecified: Secondary | ICD-10-CM | POA: Diagnosis not present

## 2018-11-11 DIAGNOSIS — I69321 Dysphasia following cerebral infarction: Secondary | ICD-10-CM | POA: Diagnosis not present

## 2018-11-11 DIAGNOSIS — R1312 Dysphagia, oropharyngeal phase: Secondary | ICD-10-CM | POA: Diagnosis not present

## 2018-11-11 DIAGNOSIS — I959 Hypotension, unspecified: Secondary | ICD-10-CM | POA: Diagnosis not present

## 2018-11-11 DIAGNOSIS — F0390 Unspecified dementia without behavioral disturbance: Secondary | ICD-10-CM | POA: Diagnosis not present

## 2018-11-11 DIAGNOSIS — Z7901 Long term (current) use of anticoagulants: Secondary | ICD-10-CM | POA: Diagnosis not present

## 2018-11-11 DIAGNOSIS — I4891 Unspecified atrial fibrillation: Secondary | ICD-10-CM | POA: Diagnosis not present

## 2018-11-11 DIAGNOSIS — I69351 Hemiplegia and hemiparesis following cerebral infarction affecting right dominant side: Secondary | ICD-10-CM | POA: Diagnosis not present

## 2018-11-11 DIAGNOSIS — I69391 Dysphagia following cerebral infarction: Secondary | ICD-10-CM | POA: Diagnosis not present

## 2018-11-12 ENCOUNTER — Telehealth: Payer: Self-pay | Admitting: Cardiovascular Disease

## 2018-11-12 NOTE — Telephone Encounter (Signed)
Virtual Visit Pre-Appointment Phone Call  "(Name), I am calling you today to discuss your upcoming appointment. We are currently trying to limit exposure to the virus that causes COVID-19 by seeing patients at home rather than in the office."  1. "What is the BEST phone number to call the day of the visit?" - include this in appointment notes  2. Do you have or have access to (through a family member/friend) a smartphone with video capability that we can use for your visit?" a. If yes - list this number in appt notes as cell (if different from BEST phone #) and list the appointment type as a VIDEO visit in appointment notes b. If no - list the appointment type as a PHONE visit in appointment notes  3. Confirm consent - "In the setting of the current Covid19 crisis, you are scheduled for a (phone or video) visit with your provider on (date) at (time).  Just as we do with many in-office visits, in order for you to participate in this visit, we must obtain consent.  If you'd like, I can send this to your mychart (if signed up) or email for you to review.  Otherwise, I can obtain your verbal consent now.  All virtual visits are billed to your insurance company just like a normal visit would be.  By agreeing to a virtual visit, we'd like you to understand that the technology does not allow for your provider to perform an examination, and thus may limit your provider's ability to fully assess your condition. If your provider identifies any concerns that need to be evaluated in person, we will make arrangements to do so.  Finally, though the technology is pretty good, we cannot assure that it will always work on either your or our end, and in the setting of a video visit, we may have to convert it to a phone-only visit.  In either situation, we cannot ensure that we have a secure connection.  Are you willing to proceed?" STAFF: Did the patient verbally acknowledge consent to telehealth visit? Document  YES/NO here: Yes  4. Advise patient to be prepared - "Two hours prior to your appointment, go ahead and check your blood pressure, pulse, oxygen saturation, and your weight (if you have the equipment to check those) and write them all down. When your visit starts, your provider will ask you for this information. If you have an Apple Watch or Kardia device, please plan to have heart rate information ready on the day of your appointment. Please have a pen and paper handy nearby the day of the visit as well."  5. Give patient instructions for MyChart download to smartphone OR Doximity/Doxy.me as below if video visit (depending on what platform provider is using)  6. Inform patient they will receive a phone call 15 minutes prior to their appointment time (may be from unknown caller ID) so they should be prepared to answer    [name] has been deemed a candidate for a follow-up tele-health visit to limit community exposure during the Covid-19 pandemic. I spoke with the patient via phone to ensure availability of phone/video source, confirm preferred email & phone number, and discuss instructions and expectations.  I reminded [name] to be prepared with any vital sign and/or heart rhythm information that could potentially be obtained via home monitoring, at the time of his visit. I reminded [name] to expect a phone call prior to  his visit.  [name] 11/12/2018 2:02 PM   INSTRUCTIONS FOR DOWNLOADING THE MYCHART APP TO SMARTPHONE  - The patient must first make sure to have activated MyChart and know their login information - If Apple, go to [employer] and type in MyChart in the search bar and download the app. If Android, ask patient to go to [employer] and type in [place] in the search bar and download the app. The app is free but as with any other app downloads, their phone may require them to verify saved payment information or  Apple/Android password.  - The patient will need to then log into the app with their MyChart username and password, and select [facility] as their healthcare provider to link the account. When it is time for your visit, go to the MyChart app, find appointments, and click Begin Video Visit. Be sure to Select Allow for your device to access the Microphone and Camera for your visit. You will then be connected, and your provider will be with you shortly.  **If they have any issues connecting, or need assistance please contact MyChart service desk [phone])**  **If using a computer, in order to ensure the best quality for their visit they will need to use either of the following Internet Browsers: [employer], or Google Chrome**  IF USING DOXIMITY or DOXY.ME - The patient will receive a link just prior to their visit by text.     FULL LENGTH CONSENT FOR TELE-HEALTH VISIT   I hereby voluntarily request, consent and authorize [facility] and its employed or contracted physicians, physician assistants, nurse practitioners or other licensed health care professionals (the Practitioner), to provide me with telemedicine health care services (the Services") as deemed necessary by the treating Practitioner. I acknowledge and consent to receive the Services by the Practitioner via telemedicine. I understand that the telemedicine visit will involve communicating with the Practitioner through live audiovisual communication technology and the disclosure of certain medical information by electronic transmission. I acknowledge that I have been given the opportunity to request an in-person assessment or other available alternative prior to the telemedicine visit and am voluntarily participating in the telemedicine visit.  I understand that I have the right to withhold or withdraw my consent to the use of telemedicine in the course of my care at any time, without affecting my right to future care  or treatment, and that the Practitioner or I may terminate the telemedicine visit at any time. I understand that I have the right to inspect all information obtained and/or recorded in the course of the telemedicine visit and may receive copies of available information for a reasonable fee.  I understand that some of the potential risks of receiving the Services via telemedicine include:   Delay or interruption in medical evaluation due to technological equipment failure or disruption;  Information transmitted may not be sufficient (e.g. poor resolution of images) to allow for appropriate medical decision making by the Practitioner; and/or   In rare instances, security protocols could fail, causing a breach of personal health information.  Furthermore, I acknowledge that it is my responsibility to provide information about my medical history, conditions and care that is complete and accurate to the best of my ability. I acknowledge that Practitioner's advice, recommendations, and/or decision may be based on factors not within their control, such as incomplete or inaccurate data provided by me or distortions of diagnostic images or specimens that may result from electronic transmissions. I  understand that the practice of medicine is not an exact science and that Practitioner makes no warranties or guarantees regarding treatment outcomes. I acknowledge that I will receive a copy of this consent concurrently upon execution via email to the email address I last provided but may also request a printed copy by calling the office of [facility].    I understand that my insurance will be billed for this visit.   I have read or had this consent read to me.  I understand the contents of this consent, which adequately explains the benefits and risks of the Services being provided via telemedicine.   I have been provided ample opportunity to ask questions regarding this consent and the Services and have had  my questions answered to my satisfaction.  I give my informed consent for the services to be provided through the use of telemedicine in my medical care  By participating in this telemedicine visit I agree to the above.

## 2018-11-13 ENCOUNTER — Telehealth (INDEPENDENT_AMBULATORY_CARE_PROVIDER_SITE_OTHER): Payer: Medicare HMO | Admitting: Cardiovascular Disease

## 2018-11-13 ENCOUNTER — Encounter: Payer: Self-pay | Admitting: Cardiovascular Disease

## 2018-11-13 VITALS — BP 112/61 | HR 64 | Resp 18 | Wt 150.0 lb

## 2018-11-13 DIAGNOSIS — I4891 Unspecified atrial fibrillation: Secondary | ICD-10-CM | POA: Diagnosis not present

## 2018-11-13 DIAGNOSIS — I5022 Chronic systolic (congestive) heart failure: Secondary | ICD-10-CM

## 2018-11-13 DIAGNOSIS — I63232 Cerebral infarction due to unspecified occlusion or stenosis of left carotid arteries: Secondary | ICD-10-CM

## 2018-11-13 DIAGNOSIS — F0390 Unspecified dementia without behavioral disturbance: Secondary | ICD-10-CM | POA: Diagnosis not present

## 2018-11-13 DIAGNOSIS — I959 Hypotension, unspecified: Secondary | ICD-10-CM | POA: Diagnosis not present

## 2018-11-13 DIAGNOSIS — I429 Cardiomyopathy, unspecified: Secondary | ICD-10-CM

## 2018-11-13 DIAGNOSIS — I69321 Dysphasia following cerebral infarction: Secondary | ICD-10-CM | POA: Diagnosis not present

## 2018-11-13 DIAGNOSIS — I69351 Hemiplegia and hemiparesis following cerebral infarction affecting right dominant side: Secondary | ICD-10-CM | POA: Diagnosis not present

## 2018-11-13 DIAGNOSIS — I4821 Permanent atrial fibrillation: Secondary | ICD-10-CM

## 2018-11-13 DIAGNOSIS — E785 Hyperlipidemia, unspecified: Secondary | ICD-10-CM

## 2018-11-13 DIAGNOSIS — R1312 Dysphagia, oropharyngeal phase: Secondary | ICD-10-CM | POA: Diagnosis not present

## 2018-11-13 DIAGNOSIS — Z7901 Long term (current) use of anticoagulants: Secondary | ICD-10-CM | POA: Diagnosis not present

## 2018-11-13 DIAGNOSIS — I69391 Dysphagia following cerebral infarction: Secondary | ICD-10-CM | POA: Diagnosis not present

## 2018-11-13 DIAGNOSIS — R931 Abnormal findings on diagnostic imaging of heart and coronary circulation: Secondary | ICD-10-CM

## 2018-11-13 NOTE — Patient Instructions (Signed)
Your physician wants you to follow-up in: [place] will receive a reminder letter in the mail two months in advance. If you don't receive a letter, please call our office to schedule the follow-up appointment.  Your physician recommends that you continue on your current medications as directed. Please refer to the Current Medication list given to you today.  Thank you for choosing [facility]!!

## 2018-11-13 NOTE — Progress Notes (Signed)
Virtual Visit via Telephone Note   This visit type was conducted due to national recommendations for restrictions regarding the COVID-19 Pandemic (e.g. social distancing) in an effort to limit this patient's exposure and mitigate transmission in our community.  Due to his co-morbid illnesses, this patient is at least at moderate risk for complications without adequate follow up.  This format is felt to be most appropriate for this patient at this time.  The patient did not have access to video technology/had technical difficulties with video requiring transitioning to audio format only (telephone).  All issues noted in this document were discussed and addressed.  No physical exam could be performed with this format.  Please refer to the patient's chart for his  consent to telehealth for [facility]   Date:  11/13/2018   ID:  Felicita Gage, DOB 1929/09/10, MRN [id number]  Patient Location: [place] Provider Location: Home  PCP:  Rosita Fire, MD  Cardiologist:  No primary care provider on file.  Electrophysiologist:  None   Evaluation Performed:  New Patient Evaluation  Chief Complaint:  Atrial fibrillation, cardiomyopathy  History of Present Illness:    [name] is a 85 y.o. male with atrial fibrillation, HTN, dementia, cardiomyopathy, and CVA.  I did an extensive EMR review.  He was hospitalized for CVA in March 2020 and was not on anticoagulation for atrial fibrillation at that time. I reviewed head and neck CT's. He was apparently given lytics at [facility] and then transferred to [facility].   He was discharged on Eliquis but is now on Xarelto.  I spoke with Butch Penny, the floor supervisor. He has been eating breakfast and walking around. He has advanced dementia. No issues with chest pain, shortness of breath, nor leg swelling. Appetite has been good.  The patient does not have symptoms concerning for COVID-19 infection (fever, chills, cough, or new shortness  of breath).    Past Medical History:  Diagnosis Date  . [name] deficiency 2011  . Bladder cancer ([place])   . CAD (coronary artery disease)   . Hx of colonic polyp   . Hyperlipidemia   . Memory loss 2011   Past Surgical History:  Procedure Laterality Date  . CHOLECYSTECTOMY       Current Meds  Medication Sig  . acetaminophen (TYLENOL) 500 MG tablet Take 500 mg by mouth every 6 (six) hours as needed for mild pain, fever or headache.  Marland Kitchen atorvastatin (LIPITOR) 20 MG tablet Take 1 tablet (20 mg total) by mouth daily at 6 PM.  . midodrine (PROAMATINE) 2.5 MG tablet Take 2.5 mg by mouth 2 (two) times daily with a meal.  . rivaroxaban (XARELTO) 20 MG TABS tablet Take 20 mg by mouth daily with supper.     Allergies:   Patient has no known allergies.   Social History   Tobacco Use  . Smoking status: Former Smoker    Last attempt to quit: 12/29/1973    Years since quitting: 44.9  . Smokeless tobacco: Never Used  Substance Use Topics  . Alcohol use: No  . Drug use: Not on file     Family Hx: The patient's family history includes Coronary artery disease in an other family member; Dementia in an other family member.  ROS:   Please see the history of present illness.     All other systems reviewed and are negative.   Prior CV studies:   The following studies were reviewed today:  Echo 09/10/18:  1. The left ventricle has mild-moderately reduced systolic function, with an ejection fraction of 40-45%. The cavity size was normal. There is mildly increased left ventricular wall thickness. Left ventricular diastolic Doppler parameters are  indeterminate.  2. Hypokinesis of the apical, mid to apical anteroseptal, mid-apical inferoseptal, apical anterior, and apical anteroseptal myocardium.  3. The right ventricle has normal systolic function. The cavity was normal. There is no increase in right ventricular wall thickness.  4. Left atrial size was mildly dilated.  5. Right atrial size  was severely dilated.  6. The mitral valve is normal in structure.  7. The tricuspid valve is normal in structure.  8. The aortic valve is tricuspid Aortic valve regurgitation is mild by color flow Doppler.  9. The pulmonic valve was normal in structure. Pulmonic valve regurgitation is mild by color flow Doppler. 10. Large patent foramen ovale. 11. Cannot rule out PFO by color flow Doppler.  Labs/Other Tests and Data Reviewed:    EKG:  An ECG dated 10/24/18 was personally reviewed today and demonstrated:  Atrial fibrillation, late R wave transition, 74 bpm  Recent Labs: 11/05/2018: ALT 9; BUN 19; Creatinine, Ser 1.10; Hemoglobin 12.4; Platelets 145; Potassium 4.3; Sodium 139   Recent Lipid Panel Lab Results  Component Value Date/Time   CHOL 171 09/12/2018 03:54 AM   TRIG 89 09/12/2018 03:54 AM   HDL 35 (L) 09/12/2018 03:54 AM   CHOLHDL 4.9 09/12/2018 03:54 AM   LDLCALC 118 (H) 09/12/2018 03:54 AM    Wt Readings from Last 3 Encounters:  11/13/18 150 lb (68 kg)  10/24/18 165 lb (74.8 kg)  10/23/18 150 lb (68 kg)     Objective:    Vital Signs:  BP 112/61   Pulse 64   Resp 18   Wt 150 lb (68 kg)   BMI 22.15 kg/m    VITAL SIGNS:  reviewed  ASSESSMENT & PLAN:    1. Permanent atrial fibrillation: Anticoagulated with Xarelto. Reportedly symptomatically stable.  2. Cardiomyopathy/chronic systolic HF: Wall motion abnormalities suggestive of underlying CAD. Given advanced age, dementia, and other comorbidities, I will not pursue an ischemic evaluation and I plan to manage medically. On statin. No ASA as he is on Xarelto. No reported evidence of hypervolemia.  3. History of CVA: On Xarelto. PFO noted. Not certain he is a good candidate for closure device given frailty and additional comorbidities. Also on statin.  4. Hypotension: On midodrine. BP normal.   COVID-19 Education: The signs and symptoms of COVID-19 were discussed with the patient and how to seek care for testing  (follow up with PCP or arrange E-visit).  The importance of social distancing was discussed today.  Time:   Today, I have spent 30 minutes with the patient with telehealth technology discussing the above problems.     Medication Adjustments/Labs and Tests Ordered: Current medicines are reviewed at length with the patient today.  Concerns regarding medicines are outlined above.   Tests Ordered: No orders of the defined types were placed in this encounter.   Medication Changes: No orders of the defined types were placed in this encounter.   Disposition:  Follow up in 6 month(s)  Signed, Kate Sable, MD  11/13/2018 9:54 AM    [facility]

## 2018-11-14 DIAGNOSIS — I69391 Dysphagia following cerebral infarction: Secondary | ICD-10-CM | POA: Diagnosis not present

## 2018-11-14 DIAGNOSIS — I69321 Dysphasia following cerebral infarction: Secondary | ICD-10-CM | POA: Diagnosis not present

## 2018-11-14 DIAGNOSIS — Z7901 Long term (current) use of anticoagulants: Secondary | ICD-10-CM | POA: Diagnosis not present

## 2018-11-14 DIAGNOSIS — I4891 Unspecified atrial fibrillation: Secondary | ICD-10-CM | POA: Diagnosis not present

## 2018-11-14 DIAGNOSIS — I69351 Hemiplegia and hemiparesis following cerebral infarction affecting right dominant side: Secondary | ICD-10-CM | POA: Diagnosis not present

## 2018-11-14 DIAGNOSIS — R1312 Dysphagia, oropharyngeal phase: Secondary | ICD-10-CM | POA: Diagnosis not present

## 2018-11-14 DIAGNOSIS — F0390 Unspecified dementia without behavioral disturbance: Secondary | ICD-10-CM | POA: Diagnosis not present

## 2018-11-14 DIAGNOSIS — I959 Hypotension, unspecified: Secondary | ICD-10-CM | POA: Diagnosis not present

## 2018-11-15 DIAGNOSIS — I69391 Dysphagia following cerebral infarction: Secondary | ICD-10-CM | POA: Diagnosis not present

## 2018-11-15 DIAGNOSIS — I69321 Dysphasia following cerebral infarction: Secondary | ICD-10-CM | POA: Diagnosis not present

## 2018-11-15 DIAGNOSIS — I4891 Unspecified atrial fibrillation: Secondary | ICD-10-CM | POA: Diagnosis not present

## 2018-11-15 DIAGNOSIS — Z7901 Long term (current) use of anticoagulants: Secondary | ICD-10-CM | POA: Diagnosis not present

## 2018-11-15 DIAGNOSIS — I959 Hypotension, unspecified: Secondary | ICD-10-CM | POA: Diagnosis not present

## 2018-11-15 DIAGNOSIS — F0390 Unspecified dementia without behavioral disturbance: Secondary | ICD-10-CM | POA: Diagnosis not present

## 2018-11-15 DIAGNOSIS — R1312 Dysphagia, oropharyngeal phase: Secondary | ICD-10-CM | POA: Diagnosis not present

## 2018-11-15 DIAGNOSIS — I69351 Hemiplegia and hemiparesis following cerebral infarction affecting right dominant side: Secondary | ICD-10-CM | POA: Diagnosis not present

## 2018-11-18 DIAGNOSIS — F0390 Unspecified dementia without behavioral disturbance: Secondary | ICD-10-CM | POA: Diagnosis not present

## 2018-11-18 DIAGNOSIS — I69321 Dysphasia following cerebral infarction: Secondary | ICD-10-CM | POA: Diagnosis not present

## 2018-11-18 DIAGNOSIS — Z7901 Long term (current) use of anticoagulants: Secondary | ICD-10-CM | POA: Diagnosis not present

## 2018-11-18 DIAGNOSIS — I4891 Unspecified atrial fibrillation: Secondary | ICD-10-CM | POA: Diagnosis not present

## 2018-11-18 DIAGNOSIS — R1312 Dysphagia, oropharyngeal phase: Secondary | ICD-10-CM | POA: Diagnosis not present

## 2018-11-18 DIAGNOSIS — I69351 Hemiplegia and hemiparesis following cerebral infarction affecting right dominant side: Secondary | ICD-10-CM | POA: Diagnosis not present

## 2018-11-18 DIAGNOSIS — I69391 Dysphagia following cerebral infarction: Secondary | ICD-10-CM | POA: Diagnosis not present

## 2018-11-18 DIAGNOSIS — I959 Hypotension, unspecified: Secondary | ICD-10-CM | POA: Diagnosis not present

## 2018-11-20 DIAGNOSIS — I69391 Dysphagia following cerebral infarction: Secondary | ICD-10-CM | POA: Diagnosis not present

## 2018-11-20 DIAGNOSIS — I69321 Dysphasia following cerebral infarction: Secondary | ICD-10-CM | POA: Diagnosis not present

## 2018-11-20 DIAGNOSIS — I69351 Hemiplegia and hemiparesis following cerebral infarction affecting right dominant side: Secondary | ICD-10-CM | POA: Diagnosis not present

## 2018-11-20 DIAGNOSIS — F0390 Unspecified dementia without behavioral disturbance: Secondary | ICD-10-CM | POA: Diagnosis not present

## 2018-11-20 DIAGNOSIS — I959 Hypotension, unspecified: Secondary | ICD-10-CM | POA: Diagnosis not present

## 2018-11-20 DIAGNOSIS — I4891 Unspecified atrial fibrillation: Secondary | ICD-10-CM | POA: Diagnosis not present

## 2018-11-20 DIAGNOSIS — Z7901 Long term (current) use of anticoagulants: Secondary | ICD-10-CM | POA: Diagnosis not present

## 2018-11-20 DIAGNOSIS — R1312 Dysphagia, oropharyngeal phase: Secondary | ICD-10-CM | POA: Diagnosis not present

## 2018-11-21 DIAGNOSIS — I959 Hypotension, unspecified: Secondary | ICD-10-CM | POA: Diagnosis not present

## 2018-11-21 DIAGNOSIS — I69321 Dysphasia following cerebral infarction: Secondary | ICD-10-CM | POA: Diagnosis not present

## 2018-11-21 DIAGNOSIS — R1312 Dysphagia, oropharyngeal phase: Secondary | ICD-10-CM | POA: Diagnosis not present

## 2018-11-21 DIAGNOSIS — I69391 Dysphagia following cerebral infarction: Secondary | ICD-10-CM | POA: Diagnosis not present

## 2018-11-21 DIAGNOSIS — I69351 Hemiplegia and hemiparesis following cerebral infarction affecting right dominant side: Secondary | ICD-10-CM | POA: Diagnosis not present

## 2018-11-21 DIAGNOSIS — I4891 Unspecified atrial fibrillation: Secondary | ICD-10-CM | POA: Diagnosis not present

## 2018-11-21 DIAGNOSIS — F0390 Unspecified dementia without behavioral disturbance: Secondary | ICD-10-CM | POA: Diagnosis not present

## 2018-11-21 DIAGNOSIS — Z7901 Long term (current) use of anticoagulants: Secondary | ICD-10-CM | POA: Diagnosis not present

## 2018-11-22 DIAGNOSIS — F0390 Unspecified dementia without behavioral disturbance: Secondary | ICD-10-CM | POA: Diagnosis not present

## 2018-11-22 DIAGNOSIS — I69351 Hemiplegia and hemiparesis following cerebral infarction affecting right dominant side: Secondary | ICD-10-CM | POA: Diagnosis not present

## 2018-11-22 DIAGNOSIS — I69321 Dysphasia following cerebral infarction: Secondary | ICD-10-CM | POA: Diagnosis not present

## 2018-11-22 DIAGNOSIS — Z7901 Long term (current) use of anticoagulants: Secondary | ICD-10-CM | POA: Diagnosis not present

## 2018-11-22 DIAGNOSIS — I4891 Unspecified atrial fibrillation: Secondary | ICD-10-CM | POA: Diagnosis not present

## 2018-11-22 DIAGNOSIS — I959 Hypotension, unspecified: Secondary | ICD-10-CM | POA: Diagnosis not present

## 2018-11-22 DIAGNOSIS — R1312 Dysphagia, oropharyngeal phase: Secondary | ICD-10-CM | POA: Diagnosis not present

## 2018-11-22 DIAGNOSIS — I69391 Dysphagia following cerebral infarction: Secondary | ICD-10-CM | POA: Diagnosis not present

## 2018-11-25 DIAGNOSIS — F0390 Unspecified dementia without behavioral disturbance: Secondary | ICD-10-CM | POA: Diagnosis not present

## 2018-11-25 DIAGNOSIS — I959 Hypotension, unspecified: Secondary | ICD-10-CM | POA: Diagnosis not present

## 2018-11-25 DIAGNOSIS — R1312 Dysphagia, oropharyngeal phase: Secondary | ICD-10-CM | POA: Diagnosis not present

## 2018-11-25 DIAGNOSIS — I4891 Unspecified atrial fibrillation: Secondary | ICD-10-CM | POA: Diagnosis not present

## 2018-11-25 DIAGNOSIS — I69391 Dysphagia following cerebral infarction: Secondary | ICD-10-CM | POA: Diagnosis not present

## 2018-11-25 DIAGNOSIS — I69321 Dysphasia following cerebral infarction: Secondary | ICD-10-CM | POA: Diagnosis not present

## 2018-11-25 DIAGNOSIS — Z7901 Long term (current) use of anticoagulants: Secondary | ICD-10-CM | POA: Diagnosis not present

## 2018-11-25 DIAGNOSIS — I69351 Hemiplegia and hemiparesis following cerebral infarction affecting right dominant side: Secondary | ICD-10-CM | POA: Diagnosis not present

## 2018-11-27 DIAGNOSIS — Z7901 Long term (current) use of anticoagulants: Secondary | ICD-10-CM | POA: Diagnosis not present

## 2018-11-27 DIAGNOSIS — I4891 Unspecified atrial fibrillation: Secondary | ICD-10-CM | POA: Diagnosis not present

## 2018-11-27 DIAGNOSIS — F0390 Unspecified dementia without behavioral disturbance: Secondary | ICD-10-CM | POA: Diagnosis not present

## 2018-11-27 DIAGNOSIS — R1312 Dysphagia, oropharyngeal phase: Secondary | ICD-10-CM | POA: Diagnosis not present

## 2018-11-27 DIAGNOSIS — I959 Hypotension, unspecified: Secondary | ICD-10-CM | POA: Diagnosis not present

## 2018-11-27 DIAGNOSIS — I69321 Dysphasia following cerebral infarction: Secondary | ICD-10-CM | POA: Diagnosis not present

## 2018-11-27 DIAGNOSIS — I69391 Dysphagia following cerebral infarction: Secondary | ICD-10-CM | POA: Diagnosis not present

## 2018-11-27 DIAGNOSIS — I69351 Hemiplegia and hemiparesis following cerebral infarction affecting right dominant side: Secondary | ICD-10-CM | POA: Diagnosis not present

## 2018-11-29 DIAGNOSIS — I69391 Dysphagia following cerebral infarction: Secondary | ICD-10-CM | POA: Diagnosis not present

## 2018-11-29 DIAGNOSIS — I69351 Hemiplegia and hemiparesis following cerebral infarction affecting right dominant side: Secondary | ICD-10-CM | POA: Diagnosis not present

## 2018-11-29 DIAGNOSIS — I959 Hypotension, unspecified: Secondary | ICD-10-CM | POA: Diagnosis not present

## 2018-11-29 DIAGNOSIS — R1312 Dysphagia, oropharyngeal phase: Secondary | ICD-10-CM | POA: Diagnosis not present

## 2018-11-29 DIAGNOSIS — I69321 Dysphasia following cerebral infarction: Secondary | ICD-10-CM | POA: Diagnosis not present

## 2018-11-29 DIAGNOSIS — Z7901 Long term (current) use of anticoagulants: Secondary | ICD-10-CM | POA: Diagnosis not present

## 2018-11-29 DIAGNOSIS — F0390 Unspecified dementia without behavioral disturbance: Secondary | ICD-10-CM | POA: Diagnosis not present

## 2018-11-29 DIAGNOSIS — I4891 Unspecified atrial fibrillation: Secondary | ICD-10-CM | POA: Diagnosis not present

## 2018-12-03 DIAGNOSIS — Z7901 Long term (current) use of anticoagulants: Secondary | ICD-10-CM | POA: Diagnosis not present

## 2018-12-03 DIAGNOSIS — I959 Hypotension, unspecified: Secondary | ICD-10-CM | POA: Diagnosis not present

## 2018-12-03 DIAGNOSIS — I4891 Unspecified atrial fibrillation: Secondary | ICD-10-CM | POA: Diagnosis not present

## 2018-12-03 DIAGNOSIS — R1312 Dysphagia, oropharyngeal phase: Secondary | ICD-10-CM | POA: Diagnosis not present

## 2018-12-03 DIAGNOSIS — I69391 Dysphagia following cerebral infarction: Secondary | ICD-10-CM | POA: Diagnosis not present

## 2018-12-03 DIAGNOSIS — I69351 Hemiplegia and hemiparesis following cerebral infarction affecting right dominant side: Secondary | ICD-10-CM | POA: Diagnosis not present

## 2018-12-03 DIAGNOSIS — F0390 Unspecified dementia without behavioral disturbance: Secondary | ICD-10-CM | POA: Diagnosis not present

## 2018-12-03 DIAGNOSIS — I69321 Dysphasia following cerebral infarction: Secondary | ICD-10-CM | POA: Diagnosis not present

## 2018-12-04 DIAGNOSIS — I959 Hypotension, unspecified: Secondary | ICD-10-CM | POA: Diagnosis not present

## 2018-12-04 DIAGNOSIS — F0390 Unspecified dementia without behavioral disturbance: Secondary | ICD-10-CM | POA: Diagnosis not present

## 2018-12-04 DIAGNOSIS — I69321 Dysphasia following cerebral infarction: Secondary | ICD-10-CM | POA: Diagnosis not present

## 2018-12-04 DIAGNOSIS — I69351 Hemiplegia and hemiparesis following cerebral infarction affecting right dominant side: Secondary | ICD-10-CM | POA: Diagnosis not present

## 2018-12-04 DIAGNOSIS — R1312 Dysphagia, oropharyngeal phase: Secondary | ICD-10-CM | POA: Diagnosis not present

## 2018-12-04 DIAGNOSIS — I69391 Dysphagia following cerebral infarction: Secondary | ICD-10-CM | POA: Diagnosis not present

## 2018-12-04 DIAGNOSIS — Z7901 Long term (current) use of anticoagulants: Secondary | ICD-10-CM | POA: Diagnosis not present

## 2018-12-04 DIAGNOSIS — I4891 Unspecified atrial fibrillation: Secondary | ICD-10-CM | POA: Diagnosis not present

## 2018-12-05 DIAGNOSIS — I69391 Dysphagia following cerebral infarction: Secondary | ICD-10-CM | POA: Diagnosis not present

## 2018-12-05 DIAGNOSIS — R1312 Dysphagia, oropharyngeal phase: Secondary | ICD-10-CM | POA: Diagnosis not present

## 2018-12-05 DIAGNOSIS — F0390 Unspecified dementia without behavioral disturbance: Secondary | ICD-10-CM | POA: Diagnosis not present

## 2018-12-05 DIAGNOSIS — I959 Hypotension, unspecified: Secondary | ICD-10-CM | POA: Diagnosis not present

## 2018-12-05 DIAGNOSIS — I4891 Unspecified atrial fibrillation: Secondary | ICD-10-CM | POA: Diagnosis not present

## 2018-12-05 DIAGNOSIS — Z7901 Long term (current) use of anticoagulants: Secondary | ICD-10-CM | POA: Diagnosis not present

## 2018-12-05 DIAGNOSIS — I69321 Dysphasia following cerebral infarction: Secondary | ICD-10-CM | POA: Diagnosis not present

## 2018-12-05 DIAGNOSIS — I69351 Hemiplegia and hemiparesis following cerebral infarction affecting right dominant side: Secondary | ICD-10-CM | POA: Diagnosis not present

## 2018-12-06 DIAGNOSIS — R1312 Dysphagia, oropharyngeal phase: Secondary | ICD-10-CM | POA: Diagnosis not present

## 2018-12-06 DIAGNOSIS — I959 Hypotension, unspecified: Secondary | ICD-10-CM | POA: Diagnosis not present

## 2018-12-06 DIAGNOSIS — I69391 Dysphagia following cerebral infarction: Secondary | ICD-10-CM | POA: Diagnosis not present

## 2018-12-06 DIAGNOSIS — I69351 Hemiplegia and hemiparesis following cerebral infarction affecting right dominant side: Secondary | ICD-10-CM | POA: Diagnosis not present

## 2018-12-06 DIAGNOSIS — Z7901 Long term (current) use of anticoagulants: Secondary | ICD-10-CM | POA: Diagnosis not present

## 2018-12-06 DIAGNOSIS — I4891 Unspecified atrial fibrillation: Secondary | ICD-10-CM | POA: Diagnosis not present

## 2018-12-06 DIAGNOSIS — F0390 Unspecified dementia without behavioral disturbance: Secondary | ICD-10-CM | POA: Diagnosis not present

## 2018-12-06 DIAGNOSIS — I69321 Dysphasia following cerebral infarction: Secondary | ICD-10-CM | POA: Diagnosis not present

## 2018-12-09 DIAGNOSIS — I69351 Hemiplegia and hemiparesis following cerebral infarction affecting right dominant side: Secondary | ICD-10-CM | POA: Diagnosis not present

## 2018-12-09 DIAGNOSIS — I69391 Dysphagia following cerebral infarction: Secondary | ICD-10-CM | POA: Diagnosis not present

## 2018-12-09 DIAGNOSIS — R1312 Dysphagia, oropharyngeal phase: Secondary | ICD-10-CM | POA: Diagnosis not present

## 2018-12-09 DIAGNOSIS — Z7901 Long term (current) use of anticoagulants: Secondary | ICD-10-CM | POA: Diagnosis not present

## 2018-12-09 DIAGNOSIS — F0390 Unspecified dementia without behavioral disturbance: Secondary | ICD-10-CM | POA: Diagnosis not present

## 2018-12-09 DIAGNOSIS — I959 Hypotension, unspecified: Secondary | ICD-10-CM | POA: Diagnosis not present

## 2018-12-09 DIAGNOSIS — I69321 Dysphasia following cerebral infarction: Secondary | ICD-10-CM | POA: Diagnosis not present

## 2018-12-09 DIAGNOSIS — I4891 Unspecified atrial fibrillation: Secondary | ICD-10-CM | POA: Diagnosis not present

## 2018-12-10 DIAGNOSIS — Z7901 Long term (current) use of anticoagulants: Secondary | ICD-10-CM | POA: Diagnosis not present

## 2018-12-10 DIAGNOSIS — I4891 Unspecified atrial fibrillation: Secondary | ICD-10-CM | POA: Diagnosis not present

## 2018-12-10 DIAGNOSIS — R1312 Dysphagia, oropharyngeal phase: Secondary | ICD-10-CM | POA: Diagnosis not present

## 2018-12-10 DIAGNOSIS — I69351 Hemiplegia and hemiparesis following cerebral infarction affecting right dominant side: Secondary | ICD-10-CM | POA: Diagnosis not present

## 2018-12-10 DIAGNOSIS — I69391 Dysphagia following cerebral infarction: Secondary | ICD-10-CM | POA: Diagnosis not present

## 2018-12-10 DIAGNOSIS — F0390 Unspecified dementia without behavioral disturbance: Secondary | ICD-10-CM | POA: Diagnosis not present

## 2018-12-10 DIAGNOSIS — I69321 Dysphasia following cerebral infarction: Secondary | ICD-10-CM | POA: Diagnosis not present

## 2018-12-10 DIAGNOSIS — I959 Hypotension, unspecified: Secondary | ICD-10-CM | POA: Diagnosis not present

## 2018-12-11 DIAGNOSIS — I959 Hypotension, unspecified: Secondary | ICD-10-CM | POA: Diagnosis not present

## 2018-12-11 DIAGNOSIS — R1312 Dysphagia, oropharyngeal phase: Secondary | ICD-10-CM | POA: Diagnosis not present

## 2018-12-11 DIAGNOSIS — I69351 Hemiplegia and hemiparesis following cerebral infarction affecting right dominant side: Secondary | ICD-10-CM | POA: Diagnosis not present

## 2018-12-11 DIAGNOSIS — Z7901 Long term (current) use of anticoagulants: Secondary | ICD-10-CM | POA: Diagnosis not present

## 2018-12-11 DIAGNOSIS — I69321 Dysphasia following cerebral infarction: Secondary | ICD-10-CM | POA: Diagnosis not present

## 2018-12-11 DIAGNOSIS — I4891 Unspecified atrial fibrillation: Secondary | ICD-10-CM | POA: Diagnosis not present

## 2018-12-11 DIAGNOSIS — F0390 Unspecified dementia without behavioral disturbance: Secondary | ICD-10-CM | POA: Diagnosis not present

## 2018-12-11 DIAGNOSIS — I69391 Dysphagia following cerebral infarction: Secondary | ICD-10-CM | POA: Diagnosis not present

## 2018-12-13 ENCOUNTER — Other Ambulatory Visit: Payer: Self-pay

## 2018-12-13 NOTE — Patient Outreach (Signed)
Telephone outreach to patient to obtain mRs was successfully completed. mRs= 3. Spoke with [name], Mudlogger at [facility] assisted living to obtain score.

## 2018-12-16 DIAGNOSIS — Z7901 Long term (current) use of anticoagulants: Secondary | ICD-10-CM | POA: Diagnosis not present

## 2018-12-16 DIAGNOSIS — I69351 Hemiplegia and hemiparesis following cerebral infarction affecting right dominant side: Secondary | ICD-10-CM | POA: Diagnosis not present

## 2018-12-16 DIAGNOSIS — R1312 Dysphagia, oropharyngeal phase: Secondary | ICD-10-CM | POA: Diagnosis not present

## 2018-12-16 DIAGNOSIS — I69321 Dysphasia following cerebral infarction: Secondary | ICD-10-CM | POA: Diagnosis not present

## 2018-12-16 DIAGNOSIS — I959 Hypotension, unspecified: Secondary | ICD-10-CM | POA: Diagnosis not present

## 2018-12-16 DIAGNOSIS — I4891 Unspecified atrial fibrillation: Secondary | ICD-10-CM | POA: Diagnosis not present

## 2018-12-16 DIAGNOSIS — I69391 Dysphagia following cerebral infarction: Secondary | ICD-10-CM | POA: Diagnosis not present

## 2018-12-16 DIAGNOSIS — F0390 Unspecified dementia without behavioral disturbance: Secondary | ICD-10-CM | POA: Diagnosis not present

## 2018-12-17 DIAGNOSIS — F0390 Unspecified dementia without behavioral disturbance: Secondary | ICD-10-CM | POA: Diagnosis not present

## 2018-12-17 DIAGNOSIS — Z7901 Long term (current) use of anticoagulants: Secondary | ICD-10-CM | POA: Diagnosis not present

## 2018-12-17 DIAGNOSIS — I69391 Dysphagia following cerebral infarction: Secondary | ICD-10-CM | POA: Diagnosis not present

## 2018-12-17 DIAGNOSIS — I69351 Hemiplegia and hemiparesis following cerebral infarction affecting right dominant side: Secondary | ICD-10-CM | POA: Diagnosis not present

## 2018-12-17 DIAGNOSIS — I4891 Unspecified atrial fibrillation: Secondary | ICD-10-CM | POA: Diagnosis not present

## 2018-12-17 DIAGNOSIS — I69321 Dysphasia following cerebral infarction: Secondary | ICD-10-CM | POA: Diagnosis not present

## 2018-12-17 DIAGNOSIS — I959 Hypotension, unspecified: Secondary | ICD-10-CM | POA: Diagnosis not present

## 2018-12-17 DIAGNOSIS — R1312 Dysphagia, oropharyngeal phase: Secondary | ICD-10-CM | POA: Diagnosis not present

## 2018-12-18 DIAGNOSIS — I4891 Unspecified atrial fibrillation: Secondary | ICD-10-CM | POA: Diagnosis not present

## 2018-12-18 DIAGNOSIS — I959 Hypotension, unspecified: Secondary | ICD-10-CM | POA: Diagnosis not present

## 2018-12-18 DIAGNOSIS — R1312 Dysphagia, oropharyngeal phase: Secondary | ICD-10-CM | POA: Diagnosis not present

## 2018-12-18 DIAGNOSIS — F0390 Unspecified dementia without behavioral disturbance: Secondary | ICD-10-CM | POA: Diagnosis not present

## 2018-12-18 DIAGNOSIS — I69391 Dysphagia following cerebral infarction: Secondary | ICD-10-CM | POA: Diagnosis not present

## 2018-12-18 DIAGNOSIS — I69321 Dysphasia following cerebral infarction: Secondary | ICD-10-CM | POA: Diagnosis not present

## 2018-12-18 DIAGNOSIS — Z7901 Long term (current) use of anticoagulants: Secondary | ICD-10-CM | POA: Diagnosis not present

## 2018-12-18 DIAGNOSIS — I69351 Hemiplegia and hemiparesis following cerebral infarction affecting right dominant side: Secondary | ICD-10-CM | POA: Diagnosis not present

## 2018-12-19 DIAGNOSIS — I69321 Dysphasia following cerebral infarction: Secondary | ICD-10-CM | POA: Diagnosis not present

## 2018-12-19 DIAGNOSIS — Z7901 Long term (current) use of anticoagulants: Secondary | ICD-10-CM | POA: Diagnosis not present

## 2018-12-19 DIAGNOSIS — I69351 Hemiplegia and hemiparesis following cerebral infarction affecting right dominant side: Secondary | ICD-10-CM | POA: Diagnosis not present

## 2018-12-19 DIAGNOSIS — R1312 Dysphagia, oropharyngeal phase: Secondary | ICD-10-CM | POA: Diagnosis not present

## 2018-12-19 DIAGNOSIS — I4891 Unspecified atrial fibrillation: Secondary | ICD-10-CM | POA: Diagnosis not present

## 2018-12-19 DIAGNOSIS — I959 Hypotension, unspecified: Secondary | ICD-10-CM | POA: Diagnosis not present

## 2018-12-19 DIAGNOSIS — I69391 Dysphagia following cerebral infarction: Secondary | ICD-10-CM | POA: Diagnosis not present

## 2018-12-19 DIAGNOSIS — F0390 Unspecified dementia without behavioral disturbance: Secondary | ICD-10-CM | POA: Diagnosis not present

## 2018-12-23 DIAGNOSIS — F0390 Unspecified dementia without behavioral disturbance: Secondary | ICD-10-CM | POA: Diagnosis not present

## 2018-12-23 DIAGNOSIS — R1312 Dysphagia, oropharyngeal phase: Secondary | ICD-10-CM | POA: Diagnosis not present

## 2018-12-23 DIAGNOSIS — I4891 Unspecified atrial fibrillation: Secondary | ICD-10-CM | POA: Diagnosis not present

## 2018-12-23 DIAGNOSIS — I69321 Dysphasia following cerebral infarction: Secondary | ICD-10-CM | POA: Diagnosis not present

## 2018-12-23 DIAGNOSIS — Z7901 Long term (current) use of anticoagulants: Secondary | ICD-10-CM | POA: Diagnosis not present

## 2018-12-23 DIAGNOSIS — I69391 Dysphagia following cerebral infarction: Secondary | ICD-10-CM | POA: Diagnosis not present

## 2018-12-23 DIAGNOSIS — I959 Hypotension, unspecified: Secondary | ICD-10-CM | POA: Diagnosis not present

## 2018-12-23 DIAGNOSIS — I69351 Hemiplegia and hemiparesis following cerebral infarction affecting right dominant side: Secondary | ICD-10-CM | POA: Diagnosis not present

## 2018-12-24 DIAGNOSIS — R1312 Dysphagia, oropharyngeal phase: Secondary | ICD-10-CM | POA: Diagnosis not present

## 2018-12-24 DIAGNOSIS — I69351 Hemiplegia and hemiparesis following cerebral infarction affecting right dominant side: Secondary | ICD-10-CM | POA: Diagnosis not present

## 2018-12-24 DIAGNOSIS — I4891 Unspecified atrial fibrillation: Secondary | ICD-10-CM | POA: Diagnosis not present

## 2018-12-24 DIAGNOSIS — I69391 Dysphagia following cerebral infarction: Secondary | ICD-10-CM | POA: Diagnosis not present

## 2018-12-24 DIAGNOSIS — Z7901 Long term (current) use of anticoagulants: Secondary | ICD-10-CM | POA: Diagnosis not present

## 2018-12-24 DIAGNOSIS — F0390 Unspecified dementia without behavioral disturbance: Secondary | ICD-10-CM | POA: Diagnosis not present

## 2018-12-24 DIAGNOSIS — I69321 Dysphasia following cerebral infarction: Secondary | ICD-10-CM | POA: Diagnosis not present

## 2018-12-24 DIAGNOSIS — I959 Hypotension, unspecified: Secondary | ICD-10-CM | POA: Diagnosis not present

## 2018-12-25 DIAGNOSIS — I69351 Hemiplegia and hemiparesis following cerebral infarction affecting right dominant side: Secondary | ICD-10-CM | POA: Diagnosis not present

## 2018-12-25 DIAGNOSIS — R1312 Dysphagia, oropharyngeal phase: Secondary | ICD-10-CM | POA: Diagnosis not present

## 2018-12-25 DIAGNOSIS — F0390 Unspecified dementia without behavioral disturbance: Secondary | ICD-10-CM | POA: Diagnosis not present

## 2018-12-25 DIAGNOSIS — I959 Hypotension, unspecified: Secondary | ICD-10-CM | POA: Diagnosis not present

## 2018-12-25 DIAGNOSIS — I69391 Dysphagia following cerebral infarction: Secondary | ICD-10-CM | POA: Diagnosis not present

## 2018-12-25 DIAGNOSIS — I69321 Dysphasia following cerebral infarction: Secondary | ICD-10-CM | POA: Diagnosis not present

## 2018-12-25 DIAGNOSIS — I4891 Unspecified atrial fibrillation: Secondary | ICD-10-CM | POA: Diagnosis not present

## 2018-12-25 DIAGNOSIS — Z7901 Long term (current) use of anticoagulants: Secondary | ICD-10-CM | POA: Diagnosis not present

## 2018-12-30 DIAGNOSIS — I4891 Unspecified atrial fibrillation: Secondary | ICD-10-CM | POA: Diagnosis not present

## 2018-12-30 DIAGNOSIS — I951 Orthostatic hypotension: Secondary | ICD-10-CM | POA: Diagnosis not present

## 2018-12-30 DIAGNOSIS — I480 Paroxysmal atrial fibrillation: Secondary | ICD-10-CM | POA: Diagnosis not present

## 2018-12-30 DIAGNOSIS — R1312 Dysphagia, oropharyngeal phase: Secondary | ICD-10-CM | POA: Diagnosis not present

## 2018-12-30 DIAGNOSIS — G301 Alzheimer's disease with late onset: Secondary | ICD-10-CM | POA: Diagnosis not present

## 2018-12-30 DIAGNOSIS — E785 Hyperlipidemia, unspecified: Secondary | ICD-10-CM | POA: Diagnosis not present

## 2018-12-30 DIAGNOSIS — F0390 Unspecified dementia without behavioral disturbance: Secondary | ICD-10-CM | POA: Diagnosis not present

## 2018-12-30 DIAGNOSIS — Z7901 Long term (current) use of anticoagulants: Secondary | ICD-10-CM | POA: Diagnosis not present

## 2018-12-30 DIAGNOSIS — I69351 Hemiplegia and hemiparesis following cerebral infarction affecting right dominant side: Secondary | ICD-10-CM | POA: Diagnosis not present

## 2018-12-30 DIAGNOSIS — I69321 Dysphasia following cerebral infarction: Secondary | ICD-10-CM | POA: Diagnosis not present

## 2018-12-30 DIAGNOSIS — I959 Hypotension, unspecified: Secondary | ICD-10-CM | POA: Diagnosis not present

## 2018-12-30 DIAGNOSIS — I69391 Dysphagia following cerebral infarction: Secondary | ICD-10-CM | POA: Diagnosis not present

## 2018-12-31 DIAGNOSIS — I69351 Hemiplegia and hemiparesis following cerebral infarction affecting right dominant side: Secondary | ICD-10-CM | POA: Diagnosis not present

## 2018-12-31 DIAGNOSIS — I69391 Dysphagia following cerebral infarction: Secondary | ICD-10-CM | POA: Diagnosis not present

## 2018-12-31 DIAGNOSIS — I69321 Dysphasia following cerebral infarction: Secondary | ICD-10-CM | POA: Diagnosis not present

## 2018-12-31 DIAGNOSIS — I4891 Unspecified atrial fibrillation: Secondary | ICD-10-CM | POA: Diagnosis not present

## 2018-12-31 DIAGNOSIS — F0390 Unspecified dementia without behavioral disturbance: Secondary | ICD-10-CM | POA: Diagnosis not present

## 2018-12-31 DIAGNOSIS — R1312 Dysphagia, oropharyngeal phase: Secondary | ICD-10-CM | POA: Diagnosis not present

## 2018-12-31 DIAGNOSIS — I959 Hypotension, unspecified: Secondary | ICD-10-CM | POA: Diagnosis not present

## 2018-12-31 DIAGNOSIS — Z7901 Long term (current) use of anticoagulants: Secondary | ICD-10-CM | POA: Diagnosis not present

## 2019-01-01 DIAGNOSIS — I69321 Dysphasia following cerebral infarction: Secondary | ICD-10-CM | POA: Diagnosis not present

## 2019-01-01 DIAGNOSIS — I4891 Unspecified atrial fibrillation: Secondary | ICD-10-CM | POA: Diagnosis not present

## 2019-01-01 DIAGNOSIS — F0390 Unspecified dementia without behavioral disturbance: Secondary | ICD-10-CM | POA: Diagnosis not present

## 2019-01-01 DIAGNOSIS — I69351 Hemiplegia and hemiparesis following cerebral infarction affecting right dominant side: Secondary | ICD-10-CM | POA: Diagnosis not present

## 2019-01-01 DIAGNOSIS — Z7901 Long term (current) use of anticoagulants: Secondary | ICD-10-CM | POA: Diagnosis not present

## 2019-01-01 DIAGNOSIS — I959 Hypotension, unspecified: Secondary | ICD-10-CM | POA: Diagnosis not present

## 2019-01-01 DIAGNOSIS — I69391 Dysphagia following cerebral infarction: Secondary | ICD-10-CM | POA: Diagnosis not present

## 2019-01-01 DIAGNOSIS — R1312 Dysphagia, oropharyngeal phase: Secondary | ICD-10-CM | POA: Diagnosis not present

## 2019-01-07 DIAGNOSIS — I4891 Unspecified atrial fibrillation: Secondary | ICD-10-CM | POA: Diagnosis not present

## 2019-01-07 DIAGNOSIS — I959 Hypotension, unspecified: Secondary | ICD-10-CM | POA: Diagnosis not present

## 2019-01-07 DIAGNOSIS — I69351 Hemiplegia and hemiparesis following cerebral infarction affecting right dominant side: Secondary | ICD-10-CM | POA: Diagnosis not present

## 2019-01-07 DIAGNOSIS — R1312 Dysphagia, oropharyngeal phase: Secondary | ICD-10-CM | POA: Diagnosis not present

## 2019-01-07 DIAGNOSIS — I69391 Dysphagia following cerebral infarction: Secondary | ICD-10-CM | POA: Diagnosis not present

## 2019-01-07 DIAGNOSIS — I69321 Dysphasia following cerebral infarction: Secondary | ICD-10-CM | POA: Diagnosis not present

## 2019-01-07 DIAGNOSIS — Z7901 Long term (current) use of anticoagulants: Secondary | ICD-10-CM | POA: Diagnosis not present

## 2019-01-07 DIAGNOSIS — F0390 Unspecified dementia without behavioral disturbance: Secondary | ICD-10-CM | POA: Diagnosis not present

## 2019-01-10 DIAGNOSIS — G8191 Hemiplegia, unspecified affecting right dominant side: Secondary | ICD-10-CM | POA: Diagnosis not present

## 2019-01-15 DIAGNOSIS — R278 Other lack of coordination: Secondary | ICD-10-CM | POA: Diagnosis not present

## 2019-01-15 DIAGNOSIS — M6281 Muscle weakness (generalized): Secondary | ICD-10-CM | POA: Diagnosis not present

## 2019-01-20 DIAGNOSIS — R278 Other lack of coordination: Secondary | ICD-10-CM | POA: Diagnosis not present

## 2019-01-20 DIAGNOSIS — M6281 Muscle weakness (generalized): Secondary | ICD-10-CM | POA: Diagnosis not present

## 2019-01-22 DIAGNOSIS — M6281 Muscle weakness (generalized): Secondary | ICD-10-CM | POA: Diagnosis not present

## 2019-01-22 DIAGNOSIS — R278 Other lack of coordination: Secondary | ICD-10-CM | POA: Diagnosis not present

## 2019-01-24 DIAGNOSIS — H524 Presbyopia: Secondary | ICD-10-CM | POA: Diagnosis not present

## 2019-01-27 DIAGNOSIS — R278 Other lack of coordination: Secondary | ICD-10-CM | POA: Diagnosis not present

## 2019-01-27 DIAGNOSIS — M6281 Muscle weakness (generalized): Secondary | ICD-10-CM | POA: Diagnosis not present

## 2019-01-29 DIAGNOSIS — M6281 Muscle weakness (generalized): Secondary | ICD-10-CM | POA: Diagnosis not present

## 2019-01-29 DIAGNOSIS — R278 Other lack of coordination: Secondary | ICD-10-CM | POA: Diagnosis not present

## 2019-02-05 DIAGNOSIS — R278 Other lack of coordination: Secondary | ICD-10-CM | POA: Diagnosis not present

## 2019-02-05 DIAGNOSIS — M6281 Muscle weakness (generalized): Secondary | ICD-10-CM | POA: Diagnosis not present

## 2019-02-06 DIAGNOSIS — M6281 Muscle weakness (generalized): Secondary | ICD-10-CM | POA: Diagnosis not present

## 2019-02-06 DIAGNOSIS — R278 Other lack of coordination: Secondary | ICD-10-CM | POA: Diagnosis not present

## 2019-02-10 DIAGNOSIS — R278 Other lack of coordination: Secondary | ICD-10-CM | POA: Diagnosis not present

## 2019-02-10 DIAGNOSIS — M6281 Muscle weakness (generalized): Secondary | ICD-10-CM | POA: Diagnosis not present

## 2019-02-12 DIAGNOSIS — M6281 Muscle weakness (generalized): Secondary | ICD-10-CM | POA: Diagnosis not present

## 2019-02-12 DIAGNOSIS — R278 Other lack of coordination: Secondary | ICD-10-CM | POA: Diagnosis not present

## 2019-02-17 DIAGNOSIS — R278 Other lack of coordination: Secondary | ICD-10-CM | POA: Diagnosis not present

## 2019-02-17 DIAGNOSIS — M6281 Muscle weakness (generalized): Secondary | ICD-10-CM | POA: Diagnosis not present

## 2019-02-27 ENCOUNTER — Emergency Department (HOSPITAL_COMMUNITY)
Admission: EM | Admit: 2019-02-27 | Discharge: 2019-02-27 | Disposition: A | Discharge status: Home / Self Care | Payer: Medicare HMO | Attending: Emergency Medicine | Admitting: Emergency Medicine

## 2019-02-27 ENCOUNTER — Emergency Department (HOSPITAL_COMMUNITY): Payer: Medicare HMO

## 2019-02-27 ENCOUNTER — Other Ambulatory Visit: Payer: Self-pay

## 2019-02-27 ENCOUNTER — Encounter (HOSPITAL_COMMUNITY): Payer: Self-pay | Admitting: Emergency Medicine

## 2019-02-27 DIAGNOSIS — R531 Weakness: Secondary | ICD-10-CM | POA: Diagnosis not present

## 2019-02-27 DIAGNOSIS — Z7901 Long term (current) use of anticoagulants: Secondary | ICD-10-CM | POA: Insufficient documentation

## 2019-02-27 DIAGNOSIS — F0390 Unspecified dementia without behavioral disturbance: Secondary | ICD-10-CM | POA: Diagnosis not present

## 2019-02-27 DIAGNOSIS — Z8673 Personal history of transient ischemic attack (TIA), and cerebral infarction without residual deficits: Secondary | ICD-10-CM | POA: Insufficient documentation

## 2019-02-27 DIAGNOSIS — R410 Disorientation, unspecified: Secondary | ICD-10-CM | POA: Diagnosis not present

## 2019-02-27 DIAGNOSIS — R0989 Other specified symptoms and signs involving the circulatory and respiratory systems: Secondary | ICD-10-CM | POA: Diagnosis not present

## 2019-02-27 DIAGNOSIS — R5381 Other malaise: Secondary | ICD-10-CM | POA: Diagnosis not present

## 2019-02-27 DIAGNOSIS — I2510 Atherosclerotic heart disease of native coronary artery without angina pectoris: Secondary | ICD-10-CM | POA: Insufficient documentation

## 2019-02-27 DIAGNOSIS — Z79899 Other long term (current) drug therapy: Secondary | ICD-10-CM | POA: Insufficient documentation

## 2019-02-27 DIAGNOSIS — Z87891 Personal history of nicotine dependence: Secondary | ICD-10-CM | POA: Insufficient documentation

## 2019-02-27 DIAGNOSIS — Z7401 Bed confinement status: Secondary | ICD-10-CM | POA: Diagnosis not present

## 2019-02-27 DIAGNOSIS — I959 Hypotension, unspecified: Secondary | ICD-10-CM | POA: Diagnosis not present

## 2019-02-27 DIAGNOSIS — R29898 Other symptoms and signs involving the musculoskeletal system: Secondary | ICD-10-CM | POA: Diagnosis not present

## 2019-02-27 HISTORY — DX: Unspecified dementia, unspecified severity, without behavioral disturbance, psychotic disturbance, mood disturbance, and anxiety: F03.90

## 2019-02-27 LAB — CBC WITH DIFFERENTIAL/PLATELET
Abs Immature Granulocytes: 0.01 10*3/uL (ref 0.00–0.07)
Basophils Absolute: 0 10*3/uL (ref 0.0–0.1)
Basophils Relative: 1 %
Eosinophils Absolute: 0.2 10*3/uL (ref 0.0–0.5)
Eosinophils Relative: 4 %
HCT: 37.4 % — ABNORMAL LOW (ref 39.0–52.0)
Hemoglobin: 12.2 g/dL — ABNORMAL LOW (ref 13.0–17.0)
Immature Granulocytes: 0 %
Lymphocytes Relative: 24 %
Lymphs Abs: 1 10*3/uL (ref 0.7–4.0)
MCH: 33.3 pg (ref 26.0–34.0)
MCHC: 32.6 g/dL (ref 30.0–36.0)
MCV: 102.2 fL — ABNORMAL HIGH (ref 80.0–100.0)
Monocytes Absolute: 0.4 10*3/uL (ref 0.1–1.0)
Monocytes Relative: 10 %
Neutro Abs: 2.5 10*3/uL (ref 1.7–7.7)
Neutrophils Relative %: 61 %
Platelets: 143 10*3/uL — ABNORMAL LOW (ref 150–400)
RBC: 3.66 MIL/uL — ABNORMAL LOW (ref 4.22–5.81)
RDW: 12.7 % (ref 11.5–15.5)
WBC: 4.1 10*3/uL (ref 4.0–10.5)
nRBC: 0 % (ref 0.0–0.2)

## 2019-02-27 LAB — COMPREHENSIVE METABOLIC PANEL
ALT: 11 U/L (ref 0–44)
AST: 21 U/L (ref 15–41)
Albumin: 3.6 g/dL (ref 3.5–5.0)
Alkaline Phosphatase: 80 U/L (ref 38–126)
Anion gap: 7 (ref 5–15)
BUN: 21 mg/dL (ref 8–23)
CO2: 29 mmol/L (ref 22–32)
Calcium: 8.8 mg/dL — ABNORMAL LOW (ref 8.9–10.3)
Chloride: 104 mmol/L (ref 98–111)
Creatinine, Ser: 1.42 mg/dL — ABNORMAL HIGH (ref 0.61–1.24)
GFR calc Af Amer: 51 mL/min — ABNORMAL LOW (ref >60)
GFR calc non Af Amer: 44 mL/min — ABNORMAL LOW (ref >60)
Glucose, Bld: 84 mg/dL (ref 70–99)
Potassium: 3.9 mmol/L (ref 3.5–5.1)
Sodium: 140 mmol/L (ref 135–145)
Total Bilirubin: 1 mg/dL (ref 0.3–1.2)
Total Protein: 6.2 g/dL — ABNORMAL LOW (ref 6.5–8.1)

## 2019-02-27 LAB — URINALYSIS, ROUTINE W REFLEX MICROSCOPIC
Bilirubin Urine: NEGATIVE
Glucose, UA: NEGATIVE mg/dL
Hgb urine dipstick: NEGATIVE
Ketones, ur: NEGATIVE mg/dL
Leukocytes,Ua: NEGATIVE
Nitrite: NEGATIVE
Protein, ur: NEGATIVE mg/dL
Specific Gravity, Urine: 1.006 (ref 1.005–1.030)
pH: 7 (ref 5.0–8.0)

## 2019-02-27 NOTE — ED Provider Notes (Signed)
[facility] EMERGENCY DEPARTMENT Provider Note   CSN: [id number] Arrival date & time: 02/27/19  1350     History   Chief Complaint Chief Complaint  Patient presents with  . Hypotension    HPI [name] is a 85 y.o. male.     HPI Patient presents from nursing facility with concern of hypotension, acting unusually. Patient does have dementia, but his memory lapses are noted to be intermittent. Patient himself states that he feels well currently, though he notes that over the past day he has been feeling generally unwell without focal pain, fever, vomiting, cough, dyspnea. Patient states that he was well prior to the onset of symptoms today, and that no clear intervention was made prior to his improvement.  He states that he believes he takes his medication regularly, though he cannot be sure of doing so, as they are provided to him.  EMS reports the patient was hypotensive on their arrival with blood pressure 80/60.  This improved with half liter saline bolus.  Past Medical History:  Diagnosis Date  . [name] deficiency 2011  . Bladder cancer ([place])   . CAD (coronary artery disease)   . Dementia ([place])   . Hx of colonic polyp   . Hyperlipidemia   . Memory loss 2011    Patient Active Problem List   Diagnosis Date Noted  . Hygroma 09/10/2018  . 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 Chest pain 12/29/2013  . Atrial fibrillation ([place]) 12/29/2013  . VERTIGO 02/04/2010  . [name] DEFICIENCY 01/31/2010  . Memory loss 01/27/2010  . PARESTHESIA 01/27/2010  . Hyperlipidemia 11/02/2009  . CORONARY ARTERY DISEASE 11/02/2009  . SINUSITIS, ACUTE 11/02/2009  . EPISTAXIS 11/02/2009  . COLONIC POLYPS, HX OF 11/02/2009    Past Surgical History:  Procedure Laterality Date  . CHOLECYSTECTOMY          Home Medications    Prior to Admission medications   Medication Sig Start Date End Date Taking? Authorizing Provider  acetaminophen  (TYLENOL) 500 MG tablet Take 500 mg by mouth every 6 (six) hours as needed for mild pain, fever or headache.    [provider]  atorvastatin (LIPITOR) 20 MG tablet Take 1 tablet (20 mg total) by mouth daily at 6 PM. 09/14/18   Rinehuls, Shanon Brow L, PA-C  midodrine (PROAMATINE) 2.5 MG tablet Take 2.5 mg by mouth 2 (two) times daily with a meal.    [provider]  rivaroxaban (XARELTO) 20 MG TABS tablet Take 20 mg by mouth daily with supper.    [provider]    Family History Family History  Problem Relation Age of Onset  . Coronary artery disease Other   . Dementia Other     Social History Social History   Tobacco Use  . Smoking status: Former Smoker    Quit date: 12/29/1973    Years since quitting: 45.1  . Smokeless tobacco: Never Used  Substance Use Topics  . Alcohol use: No  . Drug use: Not on file     Allergies   Patient has no known allergies.   Review of Systems Review of Systems  Unable to perform ROS: Dementia     Physical Exam Updated Vital Signs BP (!) 117/58   Pulse 69   Temp 97.9 F (36.6 C) (Oral)   Resp 14   Ht 5\' 9"  (1.753 m)   Wt 68 kg   SpO2 100%   BMI 22.14 kg/m  Physical Exam Vitals signs and nursing note reviewed.  Constitutional:      General: He is not in acute distress.    Appearance: He is well-developed.  HENT:     Head: Normocephalic and atraumatic.  Eyes:     Conjunctiva/sclera: Conjunctivae normal.  Cardiovascular:     Rate and Rhythm: Normal rate and regular rhythm.  Pulmonary:     Effort: Pulmonary effort is normal. No respiratory distress.     Breath sounds: No stridor.  Abdominal:     General: There is no distension.  Skin:    General: Skin is warm and dry.  Neurological:     Mental Status: He is alert.     Motor: Atrophy present. No tremor.  Psychiatric:        Behavior: Behavior is withdrawn.     Comments: Patient knowledges some difficulty with recall, but is interacting in appropriate  manner, with some insight into today's illness.      ED Treatments / Results  Labs (all labs ordered are listed, but only abnormal results are displayed) Labs Reviewed  COMPREHENSIVE METABOLIC PANEL    EKG EKG Interpretation  Date/Time:  Thursday February 27 2019 13:58:12 EDT Ventricular Rate:  58 PR Interval:    QRS Duration: 104 QT Interval:  405 QTC Calculation: 398 R Axis:   114 Text Interpretation:  Atrial fibrillation Anterior infarct, old Nonspecific T abnormalities, lateral leads Abnormal ECG Confirmed by Carmin Muskrat [phone]) on 02/27/2019 2:02:04 PM   Radiology No results found.  Procedures Procedures (including critical care time)  Medications Ordered in ED Medications - No data to display   Initial Impression / Assessment and Plan / ED Course  I have reviewed the triage vital signs and the nursing notes.  Pertinent labs & imaging results that were available during my care of the patient were reviewed by me and considered in my medical decision making (see chart for details).    Following fluid resuscitation by EMS the patient's blood pressure normalized, and on my initial exam is 939 systolic.  This elderly male with dementia presents from nursing facility with concern of weakness, hypotension.  On here the patient is awake and alert, notes that he feels better, and blood pressure has improved compared to reported value at the nursing facility. Notably, patient is on Midrin, and is unknown whether or not the patient received his medications today. Here, patient will have labs, x-ray, repeat exam. Should these all returned unremarkably, the patient may be appropriate for return to his nursing facility.  Dr. Laverta Baltimore is aware of the patient and his presentation.  Final Clinical Impressions(s) / ED Diagnoses  delirium   Carmin Muskrat, MD 02/27/19 [phone]

## 2019-02-27 NOTE — Discharge Instructions (Signed)
You were seen today with low blood pressures and weakness.  Your blood pressures here have been normal.  We did not find evidence of infection.

## 2019-02-27 NOTE — ED Provider Notes (Signed)
Blood pressure (!) 117/58, pulse 69, temperature 97.9 F (36.6 C), temperature source Oral, resp. rate 14, height 5\' 9"  (1.753 m), weight 68 kg, SpO2 100 %.  Assuming care from Dr. Vanita Panda.  In short, [name] is a 85 y.o. male with a chief complaint of Hypotension .  Refer to the original H&P for additional details.  The current plan of care is to f/u on labs and reassess. Report of low BP and nursing facility. Vitals have normalized here.    EKG Interpretation  Date/Time:  Thursday February 27 2019 13:58:12 EDT Ventricular Rate:  58 PR Interval:    QRS Duration: 104 QT Interval:  405 QTC Calculation: 398 R Axis:   114 Text Interpretation:  Atrial fibrillation Anterior infarct, old Nonspecific T abnormalities, lateral leads Abnormal ECG Confirmed by Carmin Muskrat [phone]) on 02/27/2019 2:02:04 PM      04:37 PM  Reevaluated the patient.  He is awake and alert.  He is feeling well.  His blood pressure has not been hypotensive here.  Blood pressure in the room is systolic 462/86.  Normal heart rate.  Patient is afebrile.  No clinical findings to suspect sepsis.  EKG reviewed which is similar to prior.  At this point, plan for transfer back to his nursing facility.     Margette Fast, MD 02/27/19 [phone]

## 2019-02-27 NOTE — ED Notes (Signed)
Called [facility] to transfer Pt back to [facility].

## 2019-02-27 NOTE — ED Triage Notes (Signed)
Per EMS, pt from [facility] sent over for generalized weakness & increased confusion at times (hx of dementia) that began when he woke up this morning. When EMS arrived, BP 80/60, given 500 NS bolus PTA.

## 2019-03-04 DIAGNOSIS — G301 Alzheimer's disease with late onset: Secondary | ICD-10-CM | POA: Diagnosis not present

## 2019-03-04 DIAGNOSIS — R2689 Other abnormalities of gait and mobility: Secondary | ICD-10-CM | POA: Diagnosis not present

## 2019-03-04 DIAGNOSIS — Z7689 Persons encountering health services in other specified circumstances: Secondary | ICD-10-CM | POA: Diagnosis not present

## 2019-03-04 DIAGNOSIS — E785 Hyperlipidemia, unspecified: Secondary | ICD-10-CM | POA: Diagnosis not present

## 2019-03-04 DIAGNOSIS — I480 Paroxysmal atrial fibrillation: Secondary | ICD-10-CM | POA: Diagnosis not present

## 2019-03-18 ENCOUNTER — Other Ambulatory Visit (HOSPITAL_COMMUNITY): Payer: Self-pay | Admitting: Internal Medicine

## 2019-03-18 ENCOUNTER — Other Ambulatory Visit: Payer: Self-pay

## 2019-03-18 ENCOUNTER — Ambulatory Visit (HOSPITAL_COMMUNITY)
Admission: RE | Admit: 2019-03-18 | Discharge: 2019-03-18 | Disposition: A | Discharge status: Home / Self Care | Payer: Medicare HMO | Source: Ambulatory Visit | Attending: Internal Medicine | Admitting: Internal Medicine

## 2019-03-18 DIAGNOSIS — G301 Alzheimer's disease with late onset: Secondary | ICD-10-CM | POA: Diagnosis not present

## 2019-03-18 DIAGNOSIS — M25561 Pain in right knee: Secondary | ICD-10-CM | POA: Diagnosis not present

## 2019-03-18 DIAGNOSIS — Z7689 Persons encountering health services in other specified circumstances: Secondary | ICD-10-CM | POA: Diagnosis not present

## 2019-03-18 DIAGNOSIS — W19XXXD Unspecified fall, subsequent encounter: Secondary | ICD-10-CM | POA: Diagnosis not present

## 2019-03-24 ENCOUNTER — Emergency Department (HOSPITAL_COMMUNITY)
Admission: EM | Admit: 2019-03-24 | Discharge: 2019-03-25 | Disposition: A | Discharge status: Home / Self Care | Payer: Medicare HMO | Attending: Emergency Medicine | Admitting: Emergency Medicine

## 2019-03-24 ENCOUNTER — Emergency Department (HOSPITAL_COMMUNITY): Payer: Medicare HMO

## 2019-03-24 ENCOUNTER — Other Ambulatory Visit: Payer: Self-pay

## 2019-03-24 ENCOUNTER — Encounter (HOSPITAL_COMMUNITY): Payer: Self-pay | Admitting: *Deleted

## 2019-03-24 DIAGNOSIS — R531 Weakness: Secondary | ICD-10-CM | POA: Insufficient documentation

## 2019-03-24 DIAGNOSIS — Z8669 Personal history of other diseases of the nervous system and sense organs: Secondary | ICD-10-CM | POA: Diagnosis not present

## 2019-03-24 DIAGNOSIS — F0390 Unspecified dementia without behavioral disturbance: Secondary | ICD-10-CM | POA: Diagnosis not present

## 2019-03-24 DIAGNOSIS — Z8673 Personal history of transient ischemic attack (TIA), and cerebral infarction without residual deficits: Secondary | ICD-10-CM | POA: Diagnosis not present

## 2019-03-24 DIAGNOSIS — R42 Dizziness and giddiness: Secondary | ICD-10-CM | POA: Insufficient documentation

## 2019-03-24 DIAGNOSIS — Z87891 Personal history of nicotine dependence: Secondary | ICD-10-CM | POA: Diagnosis not present

## 2019-03-24 DIAGNOSIS — I4891 Unspecified atrial fibrillation: Secondary | ICD-10-CM | POA: Diagnosis not present

## 2019-03-24 DIAGNOSIS — Z79899 Other long term (current) drug therapy: Secondary | ICD-10-CM | POA: Diagnosis not present

## 2019-03-24 DIAGNOSIS — Z87898 Personal history of other specified conditions: Secondary | ICD-10-CM

## 2019-03-24 DIAGNOSIS — R5381 Other malaise: Secondary | ICD-10-CM | POA: Diagnosis not present

## 2019-03-24 DIAGNOSIS — Z7901 Long term (current) use of anticoagulants: Secondary | ICD-10-CM | POA: Insufficient documentation

## 2019-03-24 DIAGNOSIS — M542 Cervicalgia: Secondary | ICD-10-CM | POA: Diagnosis not present

## 2019-03-24 DIAGNOSIS — R410 Disorientation, unspecified: Secondary | ICD-10-CM | POA: Diagnosis not present

## 2019-03-24 DIAGNOSIS — R52 Pain, unspecified: Secondary | ICD-10-CM | POA: Diagnosis not present

## 2019-03-24 LAB — BASIC METABOLIC PANEL
Anion gap: 6 (ref 5–15)
BUN: 23 mg/dL (ref 8–23)
CO2: 28 mmol/L (ref 22–32)
Calcium: 8.6 mg/dL — ABNORMAL LOW (ref 8.9–10.3)
Chloride: 104 mmol/L (ref 98–111)
Creatinine, Ser: 1.29 mg/dL — ABNORMAL HIGH (ref 0.61–1.24)
GFR calc Af Amer: 58 mL/min — ABNORMAL LOW (ref >60)
GFR calc non Af Amer: 50 mL/min — ABNORMAL LOW (ref >60)
Glucose, Bld: 98 mg/dL (ref 70–99)
Potassium: 3.7 mmol/L (ref 3.5–5.1)
Sodium: 138 mmol/L (ref 135–145)

## 2019-03-24 LAB — CBC WITH DIFFERENTIAL/PLATELET
Abs Immature Granulocytes: 0.01 10*3/uL (ref 0.00–0.07)
Basophils Absolute: 0 10*3/uL (ref 0.0–0.1)
Basophils Relative: 1 %
Eosinophils Absolute: 0.2 10*3/uL (ref 0.0–0.5)
Eosinophils Relative: 3 %
HCT: 34.4 % — ABNORMAL LOW (ref 39.0–52.0)
Hemoglobin: 11.3 g/dL — ABNORMAL LOW (ref 13.0–17.0)
Immature Granulocytes: 0 %
Lymphocytes Relative: 21 %
Lymphs Abs: 1.1 10*3/uL (ref 0.7–4.0)
MCH: 33 pg (ref 26.0–34.0)
MCHC: 32.8 g/dL (ref 30.0–36.0)
MCV: 100.6 fL — ABNORMAL HIGH (ref 80.0–100.0)
Monocytes Absolute: 0.7 10*3/uL (ref 0.1–1.0)
Monocytes Relative: 13 %
Neutro Abs: 3.2 10*3/uL (ref 1.7–7.7)
Neutrophils Relative %: 62 %
Platelets: 151 10*3/uL (ref 150–400)
RBC: 3.42 MIL/uL — ABNORMAL LOW (ref 4.22–5.81)
RDW: 12.7 % (ref 11.5–15.5)
WBC: 5.2 10*3/uL (ref 4.0–10.5)
nRBC: 0 % (ref 0.0–0.2)

## 2019-03-24 MED ORDER — SODIUM CHLORIDE 0.9 % IV SOLN
INTRAVENOUS | Status: DC
  Administered 2019-03-24: 20:00:00 via INTRAVENOUS

## 2019-03-24 NOTE — ED Notes (Signed)
Patient is back from CT scan.

## 2019-03-24 NOTE — ED Notes (Signed)
Patient transported to CT 

## 2019-03-24 NOTE — Discharge Instructions (Signed)
Blood pressures have been fine here.  Labs without significant abnormality.  CT of head without any acute findings.  Patient stable for return to [facility].

## 2019-03-24 NOTE — ED Triage Notes (Signed)
Patient brought to [facility] from [facility] for weakness, dizziness and hypotension.  Patient has history of dementia.  Patient was able to ambulate from stretcher to wheelchair.

## 2019-03-25 NOTE — ED Provider Notes (Signed)
[facility] EMERGENCY DEPARTMENT Provider Note   CSN: [id number] Arrival date & time: 03/24/19  1834     History   Chief Complaint Chief Complaint  Patient presents with  . Dizziness    HPI [name] is a 85 y.o. male.     Patient brought in by EMS from [facility] for weakness dizziness and hypotension.  Patient has a history of dementia.  Patient was able to ambulate from stretcher to wheelchair without any difficulties.  Blood pressure upon arrival here was 119/65.  Patient's past medical history significant for the dementia coronary artery disease hyperlipidemia memory loss.  And history of bladder cancer.  Patient denies any complaints but does have a history of dementia.     Past Medical History:  Diagnosis Date  . [name] deficiency 2011  . Bladder cancer ([place])   . CAD (coronary artery disease)   . Dementia ([place])   . Hx of colonic polyp   . Hyperlipidemia   . Memory loss 2011    Patient Active Problem List   Diagnosis Date Noted  . Hygroma 09/10/2018  . 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 Chest pain 12/29/2013  . Atrial fibrillation ([place]) 12/29/2013  . VERTIGO 02/04/2010  . [name] DEFICIENCY 01/31/2010  . Memory loss 01/27/2010  . PARESTHESIA 01/27/2010  . Hyperlipidemia 11/02/2009  . CORONARY ARTERY DISEASE 11/02/2009  . SINUSITIS, ACUTE 11/02/2009  . EPISTAXIS 11/02/2009  . COLONIC POLYPS, HX OF 11/02/2009    Past Surgical History:  Procedure Laterality Date  . CHOLECYSTECTOMY          Home Medications    Prior to Admission medications   Medication Sig Start Date End Date Taking? Authorizing Provider  acetaminophen (TYLENOL) 500 MG tablet Take 500 mg by mouth every 6 (six) hours as needed for mild pain, fever or headache.   Yes [provider]  atorvastatin (LIPITOR) 20 MG tablet Take 1 tablet (20 mg total) by mouth daily at 6 PM. 09/14/18  Yes Rinehuls, Shanon Brow L, PA-C  midodrine  (PROAMATINE) 2.5 MG tablet Take 2.5 mg by mouth 2 (two) times daily with a meal.   Yes [provider]  rivaroxaban (XARELTO) 20 MG TABS tablet Take 20 mg by mouth daily with supper.   Yes [provider]    Family History Family History  Problem Relation Age of Onset  . Coronary artery disease Other   . Dementia Other     Social History Social History   Tobacco Use  . Smoking status: Former Smoker    Quit date: 12/29/1973    Years since quitting: 45.2  . Smokeless tobacco: Never Used  Substance Use Topics  . Alcohol use: No  . Drug use: Not on file     Allergies   Patient has no known allergies.   Review of Systems Review of Systems  Unable to perform ROS: Dementia     Physical Exam Updated Vital Signs BP 102/61   Pulse 62   Temp 98.9 F (37.2 C)   Resp 14   Ht 1.753 m (5\' 9" )   Wt 68 kg   SpO2 96%   BMI 22.14 kg/m   Physical Exam Vitals signs and nursing note reviewed.  Constitutional:      Appearance: Normal appearance. He is well-developed.  HENT:     Head: Normocephalic and atraumatic.  Eyes:     Extraocular Movements: Extraocular movements intact.     Conjunctiva/sclera: Conjunctivae  normal.     Pupils: Pupils are equal, round, and reactive to light.  Neck:     Musculoskeletal: Neck supple.  Cardiovascular:     Rate and Rhythm: Normal rate and regular rhythm.     Heart sounds: No murmur.  Pulmonary:     Effort: Pulmonary effort is normal. No respiratory distress.     Breath sounds: Normal breath sounds.  Abdominal:     Palpations: Abdomen is soft.     Tenderness: There is no abdominal tenderness.  Musculoskeletal:        General: No swelling.  Skin:    General: Skin is warm and dry.     Capillary Refill: Capillary refill takes less than 2 seconds.  Neurological:     General: No focal deficit present.     Mental Status: He is alert. Mental status is at baseline.     Cranial Nerves: No cranial nerve deficit.     Motor:  No weakness.      ED Treatments / Results  Labs (all labs ordered are listed, but only abnormal results are displayed) Labs Reviewed  CBC WITH DIFFERENTIAL/PLATELET - Abnormal; Notable for the following components:      Result Value   RBC 3.42 (*)    Hemoglobin 11.3 (*)    HCT 34.4 (*)    MCV 100.6 (*)    All other components within normal limits  BASIC METABOLIC PANEL - Abnormal; Notable for the following components:   Creatinine, Ser 1.29 (*)    Calcium 8.6 (*)    GFR calc non Af Amer 50 (*)    GFR calc Af Amer 58 (*)    All other components within normal limits    EKG EKG Interpretation  Date/Time:  Monday March 24 2019 19:01:27 EDT Ventricular Rate:  75 PR Interval:    QRS Duration: 90 QT Interval:  373 QTC Calculation: 417 R Axis:   62 Text Interpretation:  Atrial fibrillation Anterior infarct, old No significant change since last tracing Confirmed by Fredia Sorrow [phone]) on 03/24/2019 7:15:40 PM   Radiology Ct Head Wo Contrast  Result Date: 03/24/2019 CLINICAL DATA:  Weakness and hypotension EXAM: CT HEAD WITHOUT CONTRAST TECHNIQUE: Contiguous axial images were obtained from the base of the skull through the vertex without intravenous contrast. COMPARISON:  11/05/2018 FINDINGS: Brain: Mild atrophic changes are again identified. Mild chronic white matter ischemic changes are seen as well. No findings to suggest acute hemorrhage, acute infarction or space-occupying mass lesion are noted. Vascular: No hyperdense vessel or unexpected calcification. Skull: Normal. Negative for fracture or focal lesion. Sinuses/Orbits: No acute finding. Other: None. IMPRESSION: Chronic atrophic and ischemic changes stable from the previous exam. Electronically Signed   By: Inez Catalina M.D.   On: 03/24/2019 20:03    Procedures Procedures (including critical care time)  Medications Ordered in ED Medications  0.9 %  sodium chloride infusion ( Intravenous Stopped 03/25/19 0007)      Initial Impression / Assessment and Plan / ED Course  I have reviewed the triage vital signs and the nursing notes.  Pertinent labs & imaging results that were available during my care of the patient were reviewed by me and considered in my medical decision making (see chart for details).        Head CT without any acute findings.  Labs without any significant abnormality.  Patient monitored here no hypotension.  No obvious signs of any vertigo.  Patient stable for discharge back to facility.  EKG  showed rate controlled atrial fibrillation.  Patient is on Xarelto.  We assume its for that.  Patient had some mild renal insufficiency.  Final Clinical Impressions(s) / ED Diagnoses   Final diagnoses:  Hx of dizziness  Weakness  Dementia without behavioral disturbance, unspecified dementia type [facility])    ED Discharge Orders    None       Fredia Sorrow, MD 03/25/19 0107

## 2019-04-02 DIAGNOSIS — M25561 Pain in right knee: Secondary | ICD-10-CM | POA: Diagnosis not present

## 2019-04-02 DIAGNOSIS — I951 Orthostatic hypotension: Secondary | ICD-10-CM | POA: Diagnosis not present

## 2019-04-02 DIAGNOSIS — R2689 Other abnormalities of gait and mobility: Secondary | ICD-10-CM | POA: Diagnosis not present

## 2019-04-02 DIAGNOSIS — G301 Alzheimer's disease with late onset: Secondary | ICD-10-CM | POA: Diagnosis not present

## 2019-04-02 DIAGNOSIS — Z7689 Persons encountering health services in other specified circumstances: Secondary | ICD-10-CM | POA: Diagnosis not present

## 2019-04-09 DIAGNOSIS — I69351 Hemiplegia and hemiparesis following cerebral infarction affecting right dominant side: Secondary | ICD-10-CM | POA: Diagnosis not present

## 2019-04-09 DIAGNOSIS — R42 Dizziness and giddiness: Secondary | ICD-10-CM | POA: Diagnosis not present

## 2019-04-09 DIAGNOSIS — I4891 Unspecified atrial fibrillation: Secondary | ICD-10-CM | POA: Diagnosis not present

## 2019-04-09 DIAGNOSIS — Z7901 Long term (current) use of anticoagulants: Secondary | ICD-10-CM | POA: Diagnosis not present

## 2019-04-09 DIAGNOSIS — F0390 Unspecified dementia without behavioral disturbance: Secondary | ICD-10-CM | POA: Diagnosis not present

## 2019-04-16 DIAGNOSIS — I4891 Unspecified atrial fibrillation: Secondary | ICD-10-CM | POA: Diagnosis not present

## 2019-04-16 DIAGNOSIS — R42 Dizziness and giddiness: Secondary | ICD-10-CM | POA: Diagnosis not present

## 2019-04-16 DIAGNOSIS — Z7901 Long term (current) use of anticoagulants: Secondary | ICD-10-CM | POA: Diagnosis not present

## 2019-04-16 DIAGNOSIS — F0390 Unspecified dementia without behavioral disturbance: Secondary | ICD-10-CM | POA: Diagnosis not present

## 2019-04-16 DIAGNOSIS — I69351 Hemiplegia and hemiparesis following cerebral infarction affecting right dominant side: Secondary | ICD-10-CM | POA: Diagnosis not present

## 2019-04-17 ENCOUNTER — Encounter: Payer: Self-pay | Admitting: Adult Health

## 2019-04-17 ENCOUNTER — Telehealth: Payer: Self-pay | Admitting: Adult Health

## 2019-04-17 DIAGNOSIS — Z7901 Long term (current) use of anticoagulants: Secondary | ICD-10-CM | POA: Diagnosis not present

## 2019-04-17 DIAGNOSIS — R42 Dizziness and giddiness: Secondary | ICD-10-CM | POA: Diagnosis not present

## 2019-04-17 DIAGNOSIS — I69351 Hemiplegia and hemiparesis following cerebral infarction affecting right dominant side: Secondary | ICD-10-CM | POA: Diagnosis not present

## 2019-04-17 DIAGNOSIS — I4891 Unspecified atrial fibrillation: Secondary | ICD-10-CM | POA: Diagnosis not present

## 2019-04-17 DIAGNOSIS — F0390 Unspecified dementia without behavioral disturbance: Secondary | ICD-10-CM | POA: Diagnosis not present

## 2019-04-17 NOTE — Telephone Encounter (Signed)
I called patient regarding rescheduling 10/19 appointment due to [facility] NP being out. Patient's phone did not ring and I was unable to LVM. Letter has been sent to advise patient of cancellation.

## 2019-04-22 DIAGNOSIS — I4891 Unspecified atrial fibrillation: Secondary | ICD-10-CM | POA: Diagnosis not present

## 2019-04-22 DIAGNOSIS — R42 Dizziness and giddiness: Secondary | ICD-10-CM | POA: Diagnosis not present

## 2019-04-22 DIAGNOSIS — Z7901 Long term (current) use of anticoagulants: Secondary | ICD-10-CM | POA: Diagnosis not present

## 2019-04-22 DIAGNOSIS — F0390 Unspecified dementia without behavioral disturbance: Secondary | ICD-10-CM | POA: Diagnosis not present

## 2019-04-22 DIAGNOSIS — I69351 Hemiplegia and hemiparesis following cerebral infarction affecting right dominant side: Secondary | ICD-10-CM | POA: Diagnosis not present

## 2019-04-23 DIAGNOSIS — R42 Dizziness and giddiness: Secondary | ICD-10-CM | POA: Diagnosis not present

## 2019-04-23 DIAGNOSIS — F0390 Unspecified dementia without behavioral disturbance: Secondary | ICD-10-CM | POA: Diagnosis not present

## 2019-04-23 DIAGNOSIS — I69351 Hemiplegia and hemiparesis following cerebral infarction affecting right dominant side: Secondary | ICD-10-CM | POA: Diagnosis not present

## 2019-04-23 DIAGNOSIS — Z7901 Long term (current) use of anticoagulants: Secondary | ICD-10-CM | POA: Diagnosis not present

## 2019-04-23 DIAGNOSIS — Z7689 Persons encountering health services in other specified circumstances: Secondary | ICD-10-CM | POA: Diagnosis not present

## 2019-04-23 DIAGNOSIS — I4891 Unspecified atrial fibrillation: Secondary | ICD-10-CM | POA: Diagnosis not present

## 2019-04-28 ENCOUNTER — Ambulatory Visit: Payer: Medicare HMO | Admitting: Adult Health

## 2019-04-28 DIAGNOSIS — R42 Dizziness and giddiness: Secondary | ICD-10-CM | POA: Diagnosis not present

## 2019-04-28 DIAGNOSIS — I69351 Hemiplegia and hemiparesis following cerebral infarction affecting right dominant side: Secondary | ICD-10-CM | POA: Diagnosis not present

## 2019-04-28 DIAGNOSIS — F0390 Unspecified dementia without behavioral disturbance: Secondary | ICD-10-CM | POA: Diagnosis not present

## 2019-04-28 DIAGNOSIS — I4891 Unspecified atrial fibrillation: Secondary | ICD-10-CM | POA: Diagnosis not present

## 2019-04-28 DIAGNOSIS — Z7901 Long term (current) use of anticoagulants: Secondary | ICD-10-CM | POA: Diagnosis not present

## 2019-04-30 DIAGNOSIS — F0390 Unspecified dementia without behavioral disturbance: Secondary | ICD-10-CM | POA: Diagnosis not present

## 2019-04-30 DIAGNOSIS — I4891 Unspecified atrial fibrillation: Secondary | ICD-10-CM | POA: Diagnosis not present

## 2019-04-30 DIAGNOSIS — Z7901 Long term (current) use of anticoagulants: Secondary | ICD-10-CM | POA: Diagnosis not present

## 2019-04-30 DIAGNOSIS — I69351 Hemiplegia and hemiparesis following cerebral infarction affecting right dominant side: Secondary | ICD-10-CM | POA: Diagnosis not present

## 2019-04-30 DIAGNOSIS — R42 Dizziness and giddiness: Secondary | ICD-10-CM | POA: Diagnosis not present

## 2019-05-02 DIAGNOSIS — N182 Chronic kidney disease, stage 2 (mild): Secondary | ICD-10-CM | POA: Diagnosis not present

## 2019-05-02 DIAGNOSIS — E785 Hyperlipidemia, unspecified: Secondary | ICD-10-CM | POA: Diagnosis not present

## 2019-05-05 DIAGNOSIS — R42 Dizziness and giddiness: Secondary | ICD-10-CM | POA: Diagnosis not present

## 2019-05-05 DIAGNOSIS — F0390 Unspecified dementia without behavioral disturbance: Secondary | ICD-10-CM | POA: Diagnosis not present

## 2019-05-05 DIAGNOSIS — I4891 Unspecified atrial fibrillation: Secondary | ICD-10-CM | POA: Diagnosis not present

## 2019-05-05 DIAGNOSIS — I69351 Hemiplegia and hemiparesis following cerebral infarction affecting right dominant side: Secondary | ICD-10-CM | POA: Diagnosis not present

## 2019-05-05 DIAGNOSIS — Z7901 Long term (current) use of anticoagulants: Secondary | ICD-10-CM | POA: Diagnosis not present

## 2019-05-09 DIAGNOSIS — R42 Dizziness and giddiness: Secondary | ICD-10-CM | POA: Diagnosis not present

## 2019-05-09 DIAGNOSIS — I69351 Hemiplegia and hemiparesis following cerebral infarction affecting right dominant side: Secondary | ICD-10-CM | POA: Diagnosis not present

## 2019-05-09 DIAGNOSIS — F0390 Unspecified dementia without behavioral disturbance: Secondary | ICD-10-CM | POA: Diagnosis not present

## 2019-05-09 DIAGNOSIS — I4891 Unspecified atrial fibrillation: Secondary | ICD-10-CM | POA: Diagnosis not present

## 2019-05-09 DIAGNOSIS — Z7901 Long term (current) use of anticoagulants: Secondary | ICD-10-CM | POA: Diagnosis not present

## 2019-05-12 DIAGNOSIS — Z7901 Long term (current) use of anticoagulants: Secondary | ICD-10-CM | POA: Diagnosis not present

## 2019-05-12 DIAGNOSIS — I69351 Hemiplegia and hemiparesis following cerebral infarction affecting right dominant side: Secondary | ICD-10-CM | POA: Diagnosis not present

## 2019-05-12 DIAGNOSIS — R42 Dizziness and giddiness: Secondary | ICD-10-CM | POA: Diagnosis not present

## 2019-05-12 DIAGNOSIS — F0390 Unspecified dementia without behavioral disturbance: Secondary | ICD-10-CM | POA: Diagnosis not present

## 2019-05-12 DIAGNOSIS — I4891 Unspecified atrial fibrillation: Secondary | ICD-10-CM | POA: Diagnosis not present

## 2019-05-13 DIAGNOSIS — Z7901 Long term (current) use of anticoagulants: Secondary | ICD-10-CM | POA: Diagnosis not present

## 2019-05-13 DIAGNOSIS — I4891 Unspecified atrial fibrillation: Secondary | ICD-10-CM | POA: Diagnosis not present

## 2019-05-13 DIAGNOSIS — F0390 Unspecified dementia without behavioral disturbance: Secondary | ICD-10-CM | POA: Diagnosis not present

## 2019-05-13 DIAGNOSIS — I69351 Hemiplegia and hemiparesis following cerebral infarction affecting right dominant side: Secondary | ICD-10-CM | POA: Diagnosis not present

## 2019-05-13 DIAGNOSIS — R42 Dizziness and giddiness: Secondary | ICD-10-CM | POA: Diagnosis not present

## 2019-05-19 DIAGNOSIS — R42 Dizziness and giddiness: Secondary | ICD-10-CM | POA: Diagnosis not present

## 2019-05-19 DIAGNOSIS — Z7901 Long term (current) use of anticoagulants: Secondary | ICD-10-CM | POA: Diagnosis not present

## 2019-05-19 DIAGNOSIS — I4891 Unspecified atrial fibrillation: Secondary | ICD-10-CM | POA: Diagnosis not present

## 2019-05-19 DIAGNOSIS — F0390 Unspecified dementia without behavioral disturbance: Secondary | ICD-10-CM | POA: Diagnosis not present

## 2019-05-19 DIAGNOSIS — I69351 Hemiplegia and hemiparesis following cerebral infarction affecting right dominant side: Secondary | ICD-10-CM | POA: Diagnosis not present

## 2019-05-21 DIAGNOSIS — I4891 Unspecified atrial fibrillation: Secondary | ICD-10-CM | POA: Diagnosis not present

## 2019-05-21 DIAGNOSIS — Z7901 Long term (current) use of anticoagulants: Secondary | ICD-10-CM | POA: Diagnosis not present

## 2019-05-21 DIAGNOSIS — R42 Dizziness and giddiness: Secondary | ICD-10-CM | POA: Diagnosis not present

## 2019-05-21 DIAGNOSIS — I69351 Hemiplegia and hemiparesis following cerebral infarction affecting right dominant side: Secondary | ICD-10-CM | POA: Diagnosis not present

## 2019-05-21 DIAGNOSIS — F0390 Unspecified dementia without behavioral disturbance: Secondary | ICD-10-CM | POA: Diagnosis not present

## 2019-05-22 ENCOUNTER — Encounter: Payer: Self-pay | Admitting: Adult Health

## 2019-05-22 ENCOUNTER — Other Ambulatory Visit: Payer: Self-pay

## 2019-05-22 ENCOUNTER — Ambulatory Visit (INDEPENDENT_AMBULATORY_CARE_PROVIDER_SITE_OTHER): Payer: Medicare HMO | Admitting: Adult Health

## 2019-05-22 VITALS — BP 121/59 | HR 75 | Temp 97.6°F | Ht 69.0 in | Wt 155.6 lb

## 2019-05-22 DIAGNOSIS — E785 Hyperlipidemia, unspecified: Secondary | ICD-10-CM

## 2019-05-22 DIAGNOSIS — I4821 Permanent atrial fibrillation: Secondary | ICD-10-CM | POA: Diagnosis not present

## 2019-05-22 DIAGNOSIS — I63512 Cerebral infarction due to unspecified occlusion or stenosis of left middle cerebral artery: Secondary | ICD-10-CM | POA: Diagnosis not present

## 2019-05-22 DIAGNOSIS — F0390 Unspecified dementia without behavioral disturbance: Secondary | ICD-10-CM | POA: Diagnosis not present

## 2019-05-22 DIAGNOSIS — Z8679 Personal history of other diseases of the circulatory system: Secondary | ICD-10-CM | POA: Diagnosis not present

## 2019-05-22 NOTE — Patient Instructions (Signed)
Continue Xarelto (rivaroxaban) daily  and Lipitor for secondary stroke prevention  Continue to follow up with PCP regarding cholesterol and blood pressure management   Increase activity as tolerated and maintain a healthy diet  Continue to monitor dementia for any potential worsening or potential behavioral disturbance  Continue to monitor blood pressure at home  Maintain strict control of hypertension with blood pressure goal below 130/90, diabetes with hemoglobin A1c goal below 6.5% and cholesterol with LDL cholesterol (bad cholesterol) goal below 70 mg/dL. I also advised the patient to eat a healthy diet with plenty of whole grains, cereals, fruits and vegetables, exercise regularly and maintain ideal body weight.  Followup in the future with me in 6 months or call earlier if needed       Thank you for coming to see [place] at [facility]. I hope we have been able to provide you high quality care today.  You may receive a patient satisfaction survey over the next few weeks. We would appreciate your feedback and comments so that we may continue to improve ourselves and the health of our patients.

## 2019-05-22 NOTE — Progress Notes (Signed)
[facility] [address] [place]. [place] [address] [phone]       OFFICE FOLLOW UP NOTE  [name] Date of Birth:  1930-02-16 Medical Record Number:  [id number]   Reason for Referral: Left MCA stroke follow-up     CHIEF COMPLAINT:  Chief Complaint  Patient presents with  . Follow-up    6 mon f/u. Alone. Rm 9.     HPI: Stroke admission 09/09/2018: [name] a 85 y.o.malewith history of A. fib not on anticoagulation x1 year, hygroma, and significant dementia who presented from SNF to [facility] with right-sided weakness. CT head negative for acute abnormality. CTA head showed emergent LVO and left ICA with severe right V4 stenosis.  CTA neck negative for emergent LVO but did show right subclavian artery occlusion.  He received IV TPA and he [facility] and was transferred to [facility] for further treatment and evaluation.  CT perfusion nondiagnostic.  MRI brain reviewed and showed several small acute infarcts involving left pre-and postcentral gyrus, left insula, frontal operculum and medial temporal lobe infarcts.  2D echo showed an EF of 40 to 45% with a bicoronal hypokinesis and left atrium mildly dilated.  Infarcts due to left distal ICA occlusion secondary to known A. fib not on anticoagulation.  Recommended to initiate Eliquis 5 mg twice daily for atrial fibrillation and secondary stroke prevention.  BP stable and recommended long-term BP goal 1 30-1 50 due to left distal ICA occlusion.  LDL 118 and initiated atorvastatin 20 mg daily.  He was discharged back to SNF with recommendations of 24/7 supervision and ongoing therapies on 09/14/2018.  Virtual visit 10/23/2018: He has been doing well since hospital discharge and has made improvement regarding right-sided weakness.  36 of history is provided by [name], SNF director, due to patient's underlying dementia.  He is currently ambulating with rolling walker without any recent  falls but goal is for him to ambulate independently without assistive device as he was prior.  He continues to participate in PT/OT/ST and SNF director is requesting approval for additional therapy sessions.  He does have underlying dementia and per SNF staff, this has been stable without any worsening.  It was recommended to be discharged on Eliquis but he is currently on Xarelto 20 mg daily for undetermined reasons (?  Insurance reasons).  Denies any bleeding or bruising on Xarelto dose.  He continues on atorvastatin without side effects myalgias.  Blood pressure obtained during therapy session which was satisfactory at 126/66.  No further concerns at this time.  Denies new or worsening stroke/TIA symptoms.  Update 05/22/2019: [name] is a 85 year old male who is being seen today for stroke follow-up accompanied by facility caregiver.  He continues to reside at [facility] long-term care facility.  He has been stable from a stroke standpoint.  Continues to ambulate with rolling walker without any recent falls.  History of dementia with fluctuating symptoms but overall stable.  Continues on Xarelto without bleeding or bruising.  Continues on atorvastatin without myalgias.  Blood pressure today 121/59. He has had multiple ED admissions with finding of hypotension.  Continues on midodrine 2.5 mg twice daily.  No further concerns at this time.     ROS:   14 system review of systems performed and negative with exception of confusion and memory loss  PMH:  Past Medical History:  Diagnosis Date  . [name] deficiency 2011  . Bladder cancer ([place])   . CAD (coronary artery disease)   .  Dementia ([place])   . Hx of colonic polyp   . Hyperlipidemia   . Memory loss 2011    PSH:  Past Surgical History:  Procedure Laterality Date  . CHOLECYSTECTOMY      Social History:  Social History   Socioeconomic History  . Marital status: Married    Spouse name: Not on file  . Number of children: Not on file  .  Years of education: Not on file  . Highest education level: Not on file  Occupational History  . Not on file  Social Needs  . Financial resource strain: Not on file  . Food insecurity    Worry: Not on file    Inability: Not on file  . Transportation needs    Medical: Not on file    Non-medical: Not on file  Tobacco Use  . Smoking status: Former Smoker    Quit date: 12/29/1973    Years since quitting: 45.4  . Smokeless tobacco: Never Used  Substance and Sexual Activity  . Alcohol use: No  . Drug use: Not on file  . Sexual activity: Not on file  Lifestyle  . Physical activity    Days per week: Not on file    Minutes per session: Not on file  . Stress: Not on file  Relationships  . Social Herbalist on phone: Not on file    Gets together: Not on file    Attends religious service: Not on file    Active member of club or organization: Not on file    Attends meetings of clubs or organizations: Not on file    Relationship status: Not on file  . Intimate partner violence    Fear of current or ex partner: Not on file    Emotionally abused: Not on file    Physically abused: Not on file    Forced sexual activity: Not on file  Other Topics Concern  . Not on file  Social History Narrative   Regular Exercise- No    Family History:  Family History  Problem Relation Age of Onset  . Coronary artery disease Other   . Dementia Other     Medications:   Current Outpatient Medications on File Prior to Visit  Medication Sig Dispense Refill  . acetaminophen (TYLENOL) 500 MG tablet Take 500 mg by mouth every 6 (six) hours as needed for mild pain, fever or headache.    Marland Kitchen atorvastatin (LIPITOR) 20 MG tablet Take 1 tablet (20 mg total) by mouth daily at 6 PM.    . midodrine (PROAMATINE) 2.5 MG tablet Take 2.5 mg by mouth 2 (two) times daily with a meal.    . rivaroxaban (XARELTO) 20 MG TABS tablet Take 20 mg by mouth daily with supper.     No current facility-administered  medications on file prior to visit.     Allergies:  No Known Allergies   Physical Exam  Vitals:   05/22/19 1007  BP: (!) 121/59  Pulse: 75  Temp: 97.6 F (36.4 C)  TempSrc: Oral  Weight: 155 lb 9.6 oz (70.6 kg)  Height: 5\' 9"  (1.753 m)   Body mass index is 22.98 kg/m. No exam data present   General: well developed, well nourished, very pleasant elderly Caucasian male, seated, in no evident distress Head: head normocephalic and atraumatic.   Neck: supple with no carotid or supraclavicular bruits Cardiovascular: regular rate and rhythm, no murmurs Musculoskeletal: no deformity Skin:  no rash/petichiae Vascular:  Normal  pulses all extremities   Neurologic Exam Mental Status: Awake and fully alert. Disoriented to place and time.  Stated month of January and after telling him today was his birthday, he was able to recite his birthday but when questioned again what month is currently is, he stated January.  Recent and remote memory diminished. Attention span, concentration and fund of knowledge diminished. Mood and affect appropriate and very pleasant.  Cranial Nerves: Fundoscopic exam reveals sharp disc margins. Pupils equal, briskly reactive to light. Extraocular movements full without nystagmus. Visual fields full to confrontation. Hearing intact. Facial sensation intact. Face, tongue, palate moves normally and symmetrically.  Motor: Normal bulk and tone. Normal strength in all tested extremity muscles except for mild bilateral lower extremity hip flexor weakness. Sensory.: intact to touch , pinprick , position and vibratory sensation.  Coordination: Rapid alternating movements normal in all extremities. Finger-to-nose and heel-to-shin performed accurately bilaterally. Gait and Station: Arises from chair without difficulty. Stance is normal. Gait demonstrates normal stride length and balance Reflexes: 1+ and symmetric. Toes downgoing.      ASSESSMENT: SHAQUELL HEHN is a  85 y.o. 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 Vascular risk factors include HTN, HLD and atrial fibrillation.  He has been stable from a stroke standpoint without residual deficits or reoccurring symptoms. History of dementia has been stable.    PLAN:  1. Left MCA infarct: Continue Xarelto (rivaroxaban) daily  and atorvastatin 20 mg for secondary stroke prevention. Maintain strict control of hypertension with blood pressure goal below 130/90, diabetes with hemoglobin A1c goal below 6.5% and cholesterol with LDL cholesterol (bad cholesterol) goal below 70 mg/dL.  I also advised the patient to eat a healthy diet with plenty of whole grains, cereals, fruits and vegetables, exercise regularly with at least 30 minutes of continuous activity daily and maintain ideal body weight. 2. Hx HTN: Not currently on any pain medication management.  He has been having episodes of hypotension and is now continues to take midodrine 2.5 mg twice daily 3. HLD: Advised to continue current treatment regimen along with continued follow-up with PCP for future prescribing and monitoring of lipid panel 4. Atrial fibrillation: Continuation of Xarelto and follow-up with PCP/cardiology for ongoing management 5. Dementia without behavioral disturbance: Has been stable without any medication management.  Advised to continue to follow.     Follow up in 6 months or call earlier if needed   Greater than 50% of time during this 25 minute visit was spent on counseling, explanation of diagnosis of left MCA infarct, reviewing risk factor management of HTN, HLD and atrial fibrillation, discussion of dementia, planning of further management along with potential future management, and discussion with patient and family answering all questions.    Frann Rider, AGNP-BC  [facility] [address] [place], [place] [address]  Phone [phone] Fax [phone] Note: This document was prepared with digital dictation and possible smart phrase technology. Any transcriptional errors that result from this process are unintentional.

## 2019-05-25 NOTE — Progress Notes (Signed)
I agree with the above plan 

## 2019-05-26 DIAGNOSIS — R42 Dizziness and giddiness: Secondary | ICD-10-CM | POA: Diagnosis not present

## 2019-05-26 DIAGNOSIS — F0390 Unspecified dementia without behavioral disturbance: Secondary | ICD-10-CM | POA: Diagnosis not present

## 2019-05-26 DIAGNOSIS — I69351 Hemiplegia and hemiparesis following cerebral infarction affecting right dominant side: Secondary | ICD-10-CM | POA: Diagnosis not present

## 2019-05-26 DIAGNOSIS — I4891 Unspecified atrial fibrillation: Secondary | ICD-10-CM | POA: Diagnosis not present

## 2019-05-26 DIAGNOSIS — Z7901 Long term (current) use of anticoagulants: Secondary | ICD-10-CM | POA: Diagnosis not present

## 2019-05-28 DIAGNOSIS — I69351 Hemiplegia and hemiparesis following cerebral infarction affecting right dominant side: Secondary | ICD-10-CM | POA: Diagnosis not present

## 2019-05-28 DIAGNOSIS — Z7901 Long term (current) use of anticoagulants: Secondary | ICD-10-CM | POA: Diagnosis not present

## 2019-05-28 DIAGNOSIS — R42 Dizziness and giddiness: Secondary | ICD-10-CM | POA: Diagnosis not present

## 2019-05-28 DIAGNOSIS — F0390 Unspecified dementia without behavioral disturbance: Secondary | ICD-10-CM | POA: Diagnosis not present

## 2019-05-28 DIAGNOSIS — I4891 Unspecified atrial fibrillation: Secondary | ICD-10-CM | POA: Diagnosis not present

## 2019-05-29 DIAGNOSIS — Z7689 Persons encountering health services in other specified circumstances: Secondary | ICD-10-CM | POA: Diagnosis not present

## 2019-06-02 DIAGNOSIS — E785 Hyperlipidemia, unspecified: Secondary | ICD-10-CM | POA: Diagnosis not present

## 2019-06-02 DIAGNOSIS — N182 Chronic kidney disease, stage 2 (mild): Secondary | ICD-10-CM | POA: Diagnosis not present

## 2019-06-12 DIAGNOSIS — U071 COVID-19: Secondary | ICD-10-CM | POA: Diagnosis not present

## 2019-06-12 DIAGNOSIS — Z20828 Contact with and (suspected) exposure to other viral communicable diseases: Secondary | ICD-10-CM | POA: Diagnosis not present

## 2019-06-16 DIAGNOSIS — Z20828 Contact with and (suspected) exposure to other viral communicable diseases: Secondary | ICD-10-CM | POA: Diagnosis not present

## 2019-06-16 DIAGNOSIS — U071 COVID-19: Secondary | ICD-10-CM | POA: Diagnosis not present

## 2019-06-23 DIAGNOSIS — U071 COVID-19: Secondary | ICD-10-CM | POA: Diagnosis not present

## 2019-06-23 DIAGNOSIS — Z20828 Contact with and (suspected) exposure to other viral communicable diseases: Secondary | ICD-10-CM | POA: Diagnosis not present

## 2019-06-25 DIAGNOSIS — M6281 Muscle weakness (generalized): Secondary | ICD-10-CM | POA: Diagnosis not present

## 2019-06-25 DIAGNOSIS — R278 Other lack of coordination: Secondary | ICD-10-CM | POA: Diagnosis not present

## 2019-06-26 DIAGNOSIS — R278 Other lack of coordination: Secondary | ICD-10-CM | POA: Diagnosis not present

## 2019-06-26 DIAGNOSIS — M6281 Muscle weakness (generalized): Secondary | ICD-10-CM | POA: Diagnosis not present

## 2019-06-27 DIAGNOSIS — R278 Other lack of coordination: Secondary | ICD-10-CM | POA: Diagnosis not present

## 2019-06-27 DIAGNOSIS — M6281 Muscle weakness (generalized): Secondary | ICD-10-CM | POA: Diagnosis not present

## 2019-06-29 DIAGNOSIS — M6281 Muscle weakness (generalized): Secondary | ICD-10-CM | POA: Diagnosis not present

## 2019-06-29 DIAGNOSIS — R278 Other lack of coordination: Secondary | ICD-10-CM | POA: Diagnosis not present

## 2019-06-30 DIAGNOSIS — U071 COVID-19: Secondary | ICD-10-CM | POA: Diagnosis not present

## 2019-06-30 DIAGNOSIS — Z20828 Contact with and (suspected) exposure to other viral communicable diseases: Secondary | ICD-10-CM | POA: Diagnosis not present

## 2019-07-01 DIAGNOSIS — M6281 Muscle weakness (generalized): Secondary | ICD-10-CM | POA: Diagnosis not present

## 2019-07-01 DIAGNOSIS — R278 Other lack of coordination: Secondary | ICD-10-CM | POA: Diagnosis not present

## 2019-07-02 DIAGNOSIS — I480 Paroxysmal atrial fibrillation: Secondary | ICD-10-CM | POA: Diagnosis not present

## 2019-07-02 DIAGNOSIS — E785 Hyperlipidemia, unspecified: Secondary | ICD-10-CM | POA: Diagnosis not present

## 2019-07-06 DIAGNOSIS — M6281 Muscle weakness (generalized): Secondary | ICD-10-CM | POA: Diagnosis not present

## 2019-07-06 DIAGNOSIS — R278 Other lack of coordination: Secondary | ICD-10-CM | POA: Diagnosis not present

## 2019-07-08 DIAGNOSIS — R278 Other lack of coordination: Secondary | ICD-10-CM | POA: Diagnosis not present

## 2019-07-08 DIAGNOSIS — M6281 Muscle weakness (generalized): Secondary | ICD-10-CM | POA: Diagnosis not present

## 2019-07-15 DIAGNOSIS — M6281 Muscle weakness (generalized): Secondary | ICD-10-CM | POA: Diagnosis not present

## 2019-07-15 DIAGNOSIS — R278 Other lack of coordination: Secondary | ICD-10-CM | POA: Diagnosis not present

## 2019-07-16 DIAGNOSIS — R278 Other lack of coordination: Secondary | ICD-10-CM | POA: Diagnosis not present

## 2019-07-16 DIAGNOSIS — M6281 Muscle weakness (generalized): Secondary | ICD-10-CM | POA: Diagnosis not present

## 2019-07-17 DIAGNOSIS — R278 Other lack of coordination: Secondary | ICD-10-CM | POA: Diagnosis not present

## 2019-07-17 DIAGNOSIS — M6281 Muscle weakness (generalized): Secondary | ICD-10-CM | POA: Diagnosis not present

## 2019-07-21 DIAGNOSIS — R278 Other lack of coordination: Secondary | ICD-10-CM | POA: Diagnosis not present

## 2019-07-21 DIAGNOSIS — R2689 Other abnormalities of gait and mobility: Secondary | ICD-10-CM | POA: Diagnosis not present

## 2019-07-21 DIAGNOSIS — M6281 Muscle weakness (generalized): Secondary | ICD-10-CM | POA: Diagnosis not present

## 2019-07-22 DIAGNOSIS — R278 Other lack of coordination: Secondary | ICD-10-CM | POA: Diagnosis not present

## 2019-07-22 DIAGNOSIS — M6281 Muscle weakness (generalized): Secondary | ICD-10-CM | POA: Diagnosis not present

## 2019-07-23 DIAGNOSIS — R2689 Other abnormalities of gait and mobility: Secondary | ICD-10-CM | POA: Diagnosis not present

## 2019-07-23 DIAGNOSIS — M6281 Muscle weakness (generalized): Secondary | ICD-10-CM | POA: Diagnosis not present

## 2019-07-23 DIAGNOSIS — R278 Other lack of coordination: Secondary | ICD-10-CM | POA: Diagnosis not present

## 2019-07-24 DIAGNOSIS — R278 Other lack of coordination: Secondary | ICD-10-CM | POA: Diagnosis not present

## 2019-07-24 DIAGNOSIS — M6281 Muscle weakness (generalized): Secondary | ICD-10-CM | POA: Diagnosis not present

## 2019-07-24 DIAGNOSIS — R2689 Other abnormalities of gait and mobility: Secondary | ICD-10-CM | POA: Diagnosis not present

## 2019-07-25 DIAGNOSIS — M6281 Muscle weakness (generalized): Secondary | ICD-10-CM | POA: Diagnosis not present

## 2019-07-25 DIAGNOSIS — R278 Other lack of coordination: Secondary | ICD-10-CM | POA: Diagnosis not present

## 2019-07-27 DIAGNOSIS — R2689 Other abnormalities of gait and mobility: Secondary | ICD-10-CM | POA: Diagnosis not present

## 2019-07-27 DIAGNOSIS — R278 Other lack of coordination: Secondary | ICD-10-CM | POA: Diagnosis not present

## 2019-07-27 DIAGNOSIS — M6281 Muscle weakness (generalized): Secondary | ICD-10-CM | POA: Diagnosis not present

## 2019-07-29 DIAGNOSIS — R278 Other lack of coordination: Secondary | ICD-10-CM | POA: Diagnosis not present

## 2019-07-29 DIAGNOSIS — M6281 Muscle weakness (generalized): Secondary | ICD-10-CM | POA: Diagnosis not present

## 2019-07-30 DIAGNOSIS — H353131 Nonexudative age-related macular degeneration, bilateral, early dry stage: Secondary | ICD-10-CM | POA: Diagnosis not present

## 2019-08-02 DIAGNOSIS — E785 Hyperlipidemia, unspecified: Secondary | ICD-10-CM | POA: Diagnosis not present

## 2019-08-02 DIAGNOSIS — N182 Chronic kidney disease, stage 2 (mild): Secondary | ICD-10-CM | POA: Diagnosis not present

## 2019-08-06 DIAGNOSIS — M6281 Muscle weakness (generalized): Secondary | ICD-10-CM | POA: Diagnosis not present

## 2019-08-06 DIAGNOSIS — R278 Other lack of coordination: Secondary | ICD-10-CM | POA: Diagnosis not present

## 2019-08-11 DIAGNOSIS — Z7689 Persons encountering health services in other specified circumstances: Secondary | ICD-10-CM | POA: Diagnosis not present

## 2019-08-13 DIAGNOSIS — M6281 Muscle weakness (generalized): Secondary | ICD-10-CM | POA: Diagnosis not present

## 2019-08-13 DIAGNOSIS — R278 Other lack of coordination: Secondary | ICD-10-CM | POA: Diagnosis not present

## 2019-08-21 DIAGNOSIS — R278 Other lack of coordination: Secondary | ICD-10-CM | POA: Diagnosis not present

## 2019-08-21 DIAGNOSIS — M6281 Muscle weakness (generalized): Secondary | ICD-10-CM | POA: Diagnosis not present

## 2019-08-29 DIAGNOSIS — M6281 Muscle weakness (generalized): Secondary | ICD-10-CM | POA: Diagnosis not present

## 2019-08-29 DIAGNOSIS — R278 Other lack of coordination: Secondary | ICD-10-CM | POA: Diagnosis not present

## 2019-08-31 DIAGNOSIS — M6281 Muscle weakness (generalized): Secondary | ICD-10-CM | POA: Diagnosis not present

## 2019-08-31 DIAGNOSIS — R278 Other lack of coordination: Secondary | ICD-10-CM | POA: Diagnosis not present

## 2019-09-02 DIAGNOSIS — G301 Alzheimer's disease with late onset: Secondary | ICD-10-CM | POA: Diagnosis not present

## 2019-09-02 DIAGNOSIS — I480 Paroxysmal atrial fibrillation: Secondary | ICD-10-CM | POA: Diagnosis not present

## 2019-09-03 DIAGNOSIS — Z7689 Persons encountering health services in other specified circumstances: Secondary | ICD-10-CM | POA: Diagnosis not present

## 2019-09-09 DIAGNOSIS — M6281 Muscle weakness (generalized): Secondary | ICD-10-CM | POA: Diagnosis not present

## 2019-09-09 DIAGNOSIS — R278 Other lack of coordination: Secondary | ICD-10-CM | POA: Diagnosis not present

## 2019-09-09 DIAGNOSIS — R2689 Other abnormalities of gait and mobility: Secondary | ICD-10-CM | POA: Diagnosis not present

## 2019-09-11 DIAGNOSIS — R278 Other lack of coordination: Secondary | ICD-10-CM | POA: Diagnosis not present

## 2019-09-11 DIAGNOSIS — M6281 Muscle weakness (generalized): Secondary | ICD-10-CM | POA: Diagnosis not present

## 2019-09-11 DIAGNOSIS — R2689 Other abnormalities of gait and mobility: Secondary | ICD-10-CM | POA: Diagnosis not present

## 2019-09-12 DIAGNOSIS — M6281 Muscle weakness (generalized): Secondary | ICD-10-CM | POA: Diagnosis not present

## 2019-09-12 DIAGNOSIS — R278 Other lack of coordination: Secondary | ICD-10-CM | POA: Diagnosis not present

## 2019-09-15 DIAGNOSIS — R278 Other lack of coordination: Secondary | ICD-10-CM | POA: Diagnosis not present

## 2019-09-15 DIAGNOSIS — R2689 Other abnormalities of gait and mobility: Secondary | ICD-10-CM | POA: Diagnosis not present

## 2019-09-15 DIAGNOSIS — M6281 Muscle weakness (generalized): Secondary | ICD-10-CM | POA: Diagnosis not present

## 2019-09-17 DIAGNOSIS — M6281 Muscle weakness (generalized): Secondary | ICD-10-CM | POA: Diagnosis not present

## 2019-09-17 DIAGNOSIS — R278 Other lack of coordination: Secondary | ICD-10-CM | POA: Diagnosis not present

## 2019-09-17 DIAGNOSIS — R2689 Other abnormalities of gait and mobility: Secondary | ICD-10-CM | POA: Diagnosis not present

## 2019-09-18 DIAGNOSIS — Z1331 Encounter for screening for depression: Secondary | ICD-10-CM | POA: Diagnosis not present

## 2019-09-18 DIAGNOSIS — I951 Orthostatic hypotension: Secondary | ICD-10-CM | POA: Diagnosis not present

## 2019-09-18 DIAGNOSIS — M6281 Muscle weakness (generalized): Secondary | ICD-10-CM | POA: Diagnosis not present

## 2019-09-18 DIAGNOSIS — G301 Alzheimer's disease with late onset: Secondary | ICD-10-CM | POA: Diagnosis not present

## 2019-09-18 DIAGNOSIS — Z1389 Encounter for screening for other disorder: Secondary | ICD-10-CM | POA: Diagnosis not present

## 2019-09-18 DIAGNOSIS — Z0001 Encounter for general adult medical examination with abnormal findings: Secondary | ICD-10-CM | POA: Diagnosis not present

## 2019-09-18 DIAGNOSIS — R278 Other lack of coordination: Secondary | ICD-10-CM | POA: Diagnosis not present

## 2019-09-18 DIAGNOSIS — I480 Paroxysmal atrial fibrillation: Secondary | ICD-10-CM | POA: Diagnosis not present

## 2019-09-18 DIAGNOSIS — Z7689 Persons encountering health services in other specified circumstances: Secondary | ICD-10-CM | POA: Diagnosis not present

## 2019-09-22 DIAGNOSIS — R2689 Other abnormalities of gait and mobility: Secondary | ICD-10-CM | POA: Diagnosis not present

## 2019-09-22 DIAGNOSIS — M6281 Muscle weakness (generalized): Secondary | ICD-10-CM | POA: Diagnosis not present

## 2019-09-22 DIAGNOSIS — R278 Other lack of coordination: Secondary | ICD-10-CM | POA: Diagnosis not present

## 2019-09-24 DIAGNOSIS — R278 Other lack of coordination: Secondary | ICD-10-CM | POA: Diagnosis not present

## 2019-09-24 DIAGNOSIS — R2689 Other abnormalities of gait and mobility: Secondary | ICD-10-CM | POA: Diagnosis not present

## 2019-09-24 DIAGNOSIS — M6281 Muscle weakness (generalized): Secondary | ICD-10-CM | POA: Diagnosis not present

## 2019-09-26 DIAGNOSIS — R278 Other lack of coordination: Secondary | ICD-10-CM | POA: Diagnosis not present

## 2019-09-26 DIAGNOSIS — M6281 Muscle weakness (generalized): Secondary | ICD-10-CM | POA: Diagnosis not present

## 2019-09-29 DIAGNOSIS — Z7689 Persons encountering health services in other specified circumstances: Secondary | ICD-10-CM | POA: Diagnosis not present

## 2019-09-29 DIAGNOSIS — M6281 Muscle weakness (generalized): Secondary | ICD-10-CM | POA: Diagnosis not present

## 2019-09-29 DIAGNOSIS — R2689 Other abnormalities of gait and mobility: Secondary | ICD-10-CM | POA: Diagnosis not present

## 2019-09-29 DIAGNOSIS — R278 Other lack of coordination: Secondary | ICD-10-CM | POA: Diagnosis not present

## 2019-10-01 DIAGNOSIS — M6281 Muscle weakness (generalized): Secondary | ICD-10-CM | POA: Diagnosis not present

## 2019-10-01 DIAGNOSIS — N182 Chronic kidney disease, stage 2 (mild): Secondary | ICD-10-CM | POA: Diagnosis not present

## 2019-10-01 DIAGNOSIS — Z0001 Encounter for general adult medical examination with abnormal findings: Secondary | ICD-10-CM | POA: Diagnosis not present

## 2019-10-01 DIAGNOSIS — R2689 Other abnormalities of gait and mobility: Secondary | ICD-10-CM | POA: Diagnosis not present

## 2019-10-01 DIAGNOSIS — E782 Mixed hyperlipidemia: Secondary | ICD-10-CM | POA: Diagnosis not present

## 2019-10-01 DIAGNOSIS — R278 Other lack of coordination: Secondary | ICD-10-CM | POA: Diagnosis not present

## 2019-10-02 DIAGNOSIS — R278 Other lack of coordination: Secondary | ICD-10-CM | POA: Diagnosis not present

## 2019-10-02 DIAGNOSIS — M6281 Muscle weakness (generalized): Secondary | ICD-10-CM | POA: Diagnosis not present

## 2019-10-03 ENCOUNTER — Emergency Department (HOSPITAL_COMMUNITY): Payer: Medicare HMO

## 2019-10-03 ENCOUNTER — Other Ambulatory Visit: Payer: Self-pay

## 2019-10-03 ENCOUNTER — Emergency Department (HOSPITAL_COMMUNITY)
Admission: EM | Admit: 2019-10-03 | Discharge: 2019-10-04 | Disposition: A | Discharge status: Skilled Nursing Facility | Payer: Medicare HMO | Attending: Emergency Medicine | Admitting: Emergency Medicine

## 2019-10-03 ENCOUNTER — Encounter (HOSPITAL_COMMUNITY): Payer: Self-pay

## 2019-10-03 DIAGNOSIS — M545 Low back pain, unspecified: Secondary | ICD-10-CM

## 2019-10-03 DIAGNOSIS — R1011 Right upper quadrant pain: Secondary | ICD-10-CM | POA: Insufficient documentation

## 2019-10-03 DIAGNOSIS — K5790 Diverticulosis of intestine, part unspecified, without perforation or abscess without bleeding: Secondary | ICD-10-CM | POA: Diagnosis not present

## 2019-10-03 DIAGNOSIS — R52 Pain, unspecified: Secondary | ICD-10-CM | POA: Diagnosis not present

## 2019-10-03 DIAGNOSIS — R000 Tachycardia, unspecified: Secondary | ICD-10-CM | POA: Diagnosis not present

## 2019-10-03 DIAGNOSIS — I2510 Atherosclerotic heart disease of native coronary artery without angina pectoris: Secondary | ICD-10-CM | POA: Diagnosis not present

## 2019-10-03 DIAGNOSIS — I4891 Unspecified atrial fibrillation: Secondary | ICD-10-CM | POA: Diagnosis not present

## 2019-10-03 DIAGNOSIS — M549 Dorsalgia, unspecified: Secondary | ICD-10-CM | POA: Diagnosis not present

## 2019-10-03 DIAGNOSIS — Z79891 Long term (current) use of opiate analgesic: Secondary | ICD-10-CM | POA: Insufficient documentation

## 2019-10-03 DIAGNOSIS — M5489 Other dorsalgia: Secondary | ICD-10-CM | POA: Diagnosis not present

## 2019-10-03 DIAGNOSIS — R55 Syncope and collapse: Secondary | ICD-10-CM | POA: Diagnosis not present

## 2019-10-03 LAB — CBC WITH DIFFERENTIAL/PLATELET
Abs Immature Granulocytes: 0.01 10*3/uL (ref 0.00–0.07)
Basophils Absolute: 0 10*3/uL (ref 0.0–0.1)
Basophils Relative: 1 %
Eosinophils Absolute: 0.3 10*3/uL (ref 0.0–0.5)
Eosinophils Relative: 6 %
HCT: 37.6 % — ABNORMAL LOW (ref 39.0–52.0)
Hemoglobin: 12.4 g/dL — ABNORMAL LOW (ref 13.0–17.0)
Immature Granulocytes: 0 %
Lymphocytes Relative: 19 %
Lymphs Abs: 1 10*3/uL (ref 0.7–4.0)
MCH: 33.1 pg (ref 26.0–34.0)
MCHC: 33 g/dL (ref 30.0–36.0)
MCV: 100.3 fL — ABNORMAL HIGH (ref 80.0–100.0)
Monocytes Absolute: 0.6 10*3/uL (ref 0.1–1.0)
Monocytes Relative: 12 %
Neutro Abs: 3.3 10*3/uL (ref 1.7–7.7)
Neutrophils Relative %: 62 %
Platelets: 142 10*3/uL — ABNORMAL LOW (ref 150–400)
RBC: 3.75 MIL/uL — ABNORMAL LOW (ref 4.22–5.81)
RDW: 13.4 % (ref 11.5–15.5)
WBC: 5.3 10*3/uL (ref 4.0–10.5)
nRBC: 0 % (ref 0.0–0.2)

## 2019-10-03 LAB — BASIC METABOLIC PANEL
Anion gap: 8 (ref 5–15)
BUN: 25 mg/dL — ABNORMAL HIGH (ref 8–23)
CO2: 27 mmol/L (ref 22–32)
Calcium: 8.9 mg/dL (ref 8.9–10.3)
Chloride: 102 mmol/L (ref 98–111)
Creatinine, Ser: 1.22 mg/dL (ref 0.61–1.24)
GFR calc Af Amer: >60 mL/min (ref >60)
GFR calc non Af Amer: 53 mL/min — ABNORMAL LOW (ref >60)
Glucose, Bld: 136 mg/dL — ABNORMAL HIGH (ref 70–99)
Potassium: 3.6 mmol/L (ref 3.5–5.1)
Sodium: 137 mmol/L (ref 135–145)

## 2019-10-03 MED ORDER — SODIUM CHLORIDE 0.9 % IV BOLUS
1000.0000 mL | Freq: Once | INTRAVENOUS | Status: AC
  Administered 2019-10-03: 1000 mL via INTRAVENOUS

## 2019-10-03 MED ORDER — ACETAMINOPHEN 500 MG PO TABS
1000.0000 mg | ORAL_TABLET | Freq: Once | ORAL | Status: AC
  Administered 2019-10-04: 1000 mg via ORAL
  Filled 2019-10-03: qty 2

## 2019-10-03 MED ORDER — OXYCODONE HCL 5 MG PO TABS
2.5000 mg | ORAL_TABLET | Freq: Once | ORAL | Status: AC
  Administered 2019-10-04: 2.5 mg via ORAL
  Filled 2019-10-03: qty 1

## 2019-10-03 NOTE — ED Triage Notes (Signed)
Pt arrived from [facility] via [facility] c/o lower right back/flank pain. Prior to arrival [facility] administered 500cc bolus NS. Pt presents with confusion but otherwise alert.

## 2019-10-03 NOTE — ED Provider Notes (Signed)
[facility] EMERGENCY DEPARTMENT Provider Note   CSN: [id number] Arrival date & time: 10/03/19  2309     History Chief Complaint  Patient presents with  . Back Pain    [name] is a 86 y.o. male.  86 yo M with a chief complaint of a syncopal event.  Patient states that he is not sure exactly what happened and cannot recall the exact events.  He denies headache denies neck pain denies chest pain or shortness of breath.  He has chronic right side pain that is been unchanged for many years.  Denies any significant worsening of that.  Has been eating and drinking normally.  Denies nausea vomiting or diarrhea.  Denies dark stool or blood in stool.  States he was at his normal level of health and then had this event today.  He lives at a skilled nursing facility and they then called 911 and forced him to come here.  States that he did not want to be taken to the facility and now feels fine.   The history is provided by the patient.  Back Pain Associated symptoms: no abdominal pain, no chest pain, no fever and no headaches   Illness Severity:  Moderate Onset quality:  Gradual Duration:  20 minutes Timing:  Rare Progression:  Resolved Chronicity:  New Associated symptoms: no abdominal pain, no chest pain, no congestion, no diarrhea, no fever, no headaches, no myalgias, no rash, no shortness of breath and no vomiting        Past Medical History:  Diagnosis Date  . [name] deficiency 2011  . Bladder cancer ([place])   . CAD (coronary artery disease)   . Dementia ([place])   . Hx of colonic polyp   . Hyperlipidemia   . Memory loss 2011    Patient Active Problem List   Diagnosis Date Noted  . Hygroma 09/10/2018  . Stroke (cerebrum) ([place]) left brain infarct status post TPA 09/09/2018  . Chest pain 12/29/2013  . Atrial fibrillation ([place]) 12/29/2013  . VERTIGO 02/04/2010  . [name] DEFICIENCY 01/31/2010  . Memory loss 01/27/2010  . PARESTHESIA 01/27/2010  . Hyperlipidemia  11/02/2009  . CORONARY ARTERY DISEASE 11/02/2009  . SINUSITIS, ACUTE 11/02/2009  . EPISTAXIS 11/02/2009  . COLONIC POLYPS, HX OF 11/02/2009    Past Surgical History:  Procedure Laterality Date  . CHOLECYSTECTOMY         Family History  Problem Relation Age of Onset  . Coronary artery disease Other   . Dementia Other     Social History   Tobacco Use  . Smoking status: Former Smoker    Quit date: 12/29/1973    Years since quitting: 45.7  . Smokeless tobacco: Never Used  Substance Use Topics  . Alcohol use: No  . Drug use: Never    Home Medications Prior to Admission medications   Medication Sig Start Date End Date Taking? Authorizing Provider  acetaminophen (TYLENOL) 500 MG tablet Take 500 mg by mouth every 6 (six) hours as needed for mild pain, fever or headache.    [provider]  atorvastatin (LIPITOR) 20 MG tablet Take 1 tablet (20 mg total) by mouth daily at 6 PM. 09/14/18   Rinehuls, Early Chars, PA-C  doxycycline (VIBRAMYCIN) 100 MG capsule Take 1 capsule (100 mg total) by mouth 2 (two) times daily. One po bid x 7 days 10/04/19   Deno Etienne, DO  midodrine (PROAMATINE) 2.5 MG tablet Take 2.5 mg by mouth 2 (two) times daily with  a meal.    [provider]  rivaroxaban (XARELTO) 20 MG TABS tablet Take 20 mg by mouth daily with supper.    [provider]    Allergies    Patient has no known allergies.  Review of Systems   Review of Systems  Constitutional: Negative for chills and fever.  HENT: Negative for congestion and facial swelling.   Eyes: Negative for discharge and visual disturbance.  Respiratory: Negative for shortness of breath.   Cardiovascular: Negative for chest pain and palpitations.  Gastrointestinal: Negative for abdominal pain, diarrhea and vomiting.  Musculoskeletal: Positive for back pain. Negative for arthralgias and myalgias.  Skin: Negative for color change and rash.  Neurological: Positive for syncope. Negative for  tremors and headaches.  Psychiatric/Behavioral: Negative for confusion and dysphoric mood.  87  Physical Exam Updated Vital Signs BP (!) 95/59   Pulse (!) 59   Temp 98 F (36.7 C) (Oral)   Resp 12   Ht 5\' 11"  (1.803 m)   Wt 75.8 kg   SpO2 97%   BMI 23.29 kg/m   Physical Exam Vitals and nursing note reviewed.  Constitutional:      Appearance: He is well-developed.  HENT:     Head: Normocephalic and atraumatic.  Eyes:     Pupils: Pupils are equal, round, and reactive to light.  Neck:     Vascular: No JVD.  Cardiovascular:     Rate and Rhythm: Normal rate and regular rhythm.     Heart sounds: No murmur. No friction rub. No gallop.   Pulmonary:     Effort: No respiratory distress.     Breath sounds: No wheezing.  Abdominal:     General: There is no distension.     Tenderness: There is no abdominal tenderness. There is no guarding or rebound.  Musculoskeletal:        General: Normal range of motion.     Cervical back: Normal range of motion and neck supple.     Comments: Points to just above the right iliac crest about the anterior axillary line as the area of pain.  States he has no pain currently.  No pain on palpation of the midline L-spine.  Able to rotate his low back without significant symptoms.  Pulse motor and sensation intact to bilateral lower extremities.  Skin:    Coloration: Skin is not pale.     Findings: No rash.  Neurological:     Mental Status: He is alert and oriented to person, place, and time.  Psychiatric:        Behavior: Behavior normal.     ED Results / Procedures / Treatments   Labs (all labs ordered are listed, but only abnormal results are displayed) Labs Reviewed  CBC WITH DIFFERENTIAL/PLATELET - Abnormal; Notable for the following components:      Result Value   RBC 3.75 (*)    Hemoglobin 12.4 (*)    HCT 37.6 (*)    MCV 100.3 (*)    Platelets 142 (*)    All other components within normal limits  BASIC METABOLIC PANEL - Abnormal;  Notable for the following components:   Glucose, Bld 136 (*)    BUN 25 (*)    GFR calc non Af Amer 53 (*)    All other components within normal limits    EKG None  Radiology CT L-SPINE NO CHARGE  Result Date: 10/04/2019 CLINICAL DATA:  Right-sided back pain and flank pain EXAM: CT LUMBAR SPINE WITHOUT  CONTRAST TECHNIQUE: Multidetector CT imaging of the lumbar spine was performed without intravenous contrast administration. Multiplanar CT image reconstructions were also generated. COMPARISON:  Lumbar spine MRI September 24, 2009 FINDINGS: Segmentation: There are 5 non-rib bearing lumbar type vertebral bodies with the last intervertebral disc space labeled as L5-S1. Alignment: There is an S-shaped scoliotic curvature of the thoracolumbar spine. Vertebrae: The vertebral body heights are well maintained. No fracture, malalignment, or pathologic osseous lesions seen. Again noted is a sclerotic foci in the left S1 segment, likely bone island. Paraspinal and other soft tissues: The paraspinal soft tissues and visualized retroperitoneal structures are unremarkable. The sacroiliac joints are intact. Scattered aortic atherosclerosis. Disc levels: Lumbar spine spondylosis is seen with mild disc height loss, facet arthrosis and broad-based disc bulge is this is most notable at L3-L4 with moderate neural foraminal narrowing and mild central canal stenosis. IMPRESSION: No acute fracture or malalignment. S-shaped scoliotic curvature of the thoracolumbar spine. Lumbar spine spondylosis most notable L3-L4 with moderate neural foraminal narrowing and mild central canal stenosis. Aortic Atherosclerosis (ICD10-I70.0). Electronically Signed   By: Prudencio Pair M.D.   On: 10/04/2019 02:35   CT Renal Stone Study  Result Date: 10/04/2019 CLINICAL DATA:  Lower right back pain and flank pain EXAM: CT ABDOMEN AND PELVIS WITHOUT CONTRAST TECHNIQUE: Multidetector CT imaging of the abdomen and pelvis was performed following the  standard protocol without IV contrast. COMPARISON:  None. FINDINGS: Lower chest: The visualized heart size within normal limits. No pericardial fluid/thickening. No hiatal hernia. Mild ground-glass opacities are seen at the right lung base. There is streaky atelectasis at both lung bases. Hepatobiliary: Although limited due to the lack of intravenous contrast, normal in appearance without gross focal abnormality. The patient is status post cholecystectomy. No biliary ductal dilation. Pancreas:  Unremarkable.  No surrounding inflammatory changes. Spleen: Normal in size. Although limited due to the lack of intravenous contrast, normal in appearance. Adrenals/Urinary Tract: Both adrenal glands appear normal. There is mild right pelviectasis and ureterectasis down to the level of the UVJ. No stone is seen. No left-sided renal calculi are noted. The bladder is unremarkable. Stomach/Bowel: The stomach and small bowel are normal in appearance. There is a moderate to large amount of colonic and rectal stool. Scattered colonic diverticula are noted without diverticulitis. Vascular/Lymphatic: There are no enlarged abdominal or pelvic lymph nodes. There is mild ectasia of the infrarenal abdominal aorta measuring up to 2.5 cm. Scattered aortic atherosclerotic calcifications are seen without aneurysmal dilatation. Reproductive: The prostate is unremarkable. Other: No evidence of abdominal wall mass or hernia. Musculoskeletal: There is a S-shaped scoliotic curvature of the thoracolumbar spine. No acute fracture or malalignment is seen. IMPRESSION: Scattered ground-glass opacities at the right lung base which could be due to atelectasis or early infectious etiology. Mild right pelviectasis and ureterectasis down to the level of the UVJ, however no obstructing stone is seen. This is nonspecific, however could be due to recently passed stone Diverticulosis without diverticulitis. Moderate amount of colonic and rectal stool Aortic  Atherosclerosis (ICD10-I70.0). Electronically Signed   By: Prudencio Pair M.D.   On: 10/04/2019 02:32    Procedures Procedures (including critical care time)  Medications Ordered in ED Medications  sodium chloride 0.9 % bolus 1,000 mL (0 mLs Intravenous Stopped 10/04/19 0151)  acetaminophen (TYLENOL) tablet 1,000 mg (1,000 mg Oral Given 10/04/19 0000)  oxyCODONE (Oxy IR/ROXICODONE) immediate release tablet 2.5 mg (2.5 mg Oral Given 10/04/19 0000)    ED Course  I have reviewed the  triage vital signs and the nursing notes.  Pertinent labs & imaging results that were available during my care of the patient were reviewed by me and considered in my medical decision making (see chart for details).    MDM Rules/Calculators/A&P                      86 yo M with a chief complaint of a syncopal event.  He is not sure exactly what happened.  Will obtain a laboratory evaluation.  He has chronic right side pain, will obtain stone study and reformats of the L-spine as he has had no recent imaging.  CT scan with possible pneumonia.  I discussed this with the patient and he now endorses having a cough.  Will start on antibiotics.  Otherwise shows significant stool burden as viewed by me.  I discussed with the patient of doing a trial of MiraLAX at home.  5:09 AM:  I have discussed the diagnosis/risks/treatment options with the patient and believe the pt to be eligible for discharge home to follow-up with PCP. We also discussed returning to the ED immediately if new or worsening sx occur. We discussed the sx which are most concerning (e.g., sudden worsening pain, fever, inability to tolerate by mouth) that necessitate immediate return. Medications administered to the patient during their visit and any new prescriptions provided to the patient are listed below.  Medications given during this visit Medications  sodium chloride 0.9 % bolus 1,000 mL (0 mLs Intravenous Stopped 10/04/19 0151)  acetaminophen  (TYLENOL) tablet 1,000 mg (1,000 mg Oral Given 10/04/19 0000)  oxyCODONE (Oxy IR/ROXICODONE) immediate release tablet 2.5 mg (2.5 mg Oral Given 10/04/19 0000)     The patient appears reasonably screen and/or stabilized for discharge and I doubt any other medical condition or other [facility] requiring further screening, evaluation, or treatment in the ED at this time prior to discharge.   Final Clinical Impression(s) / ED Diagnoses Final diagnoses:  Low back pain  Syncope and collapse    Rx / DC Orders ED Discharge Orders         Ordered    doxycycline (VIBRAMYCIN) 100 MG capsule  2 times daily     10/04/19 [place], [place], DO 10/04/19 [phone]

## 2019-10-04 DIAGNOSIS — K5790 Diverticulosis of intestine, part unspecified, without perforation or abscess without bleeding: Secondary | ICD-10-CM | POA: Diagnosis not present

## 2019-10-04 DIAGNOSIS — M549 Dorsalgia, unspecified: Secondary | ICD-10-CM | POA: Diagnosis not present

## 2019-10-04 DIAGNOSIS — M545 Low back pain: Secondary | ICD-10-CM | POA: Diagnosis not present

## 2019-10-04 MED ORDER — DOXYCYCLINE HYCLATE 100 MG PO CAPS: 100.0000 mg | ORAL_CAPSULE | Freq: Two times a day (BID) | ORAL | 0 refills | Status: DC

## 2019-10-04 NOTE — Discharge Instructions (Addendum)
Your CT scan showed that you could have pneumonia.  The CT scan also showed that you have a large amount of stool in your colon.  This could be causing her to have some pain in the area that you have had pain that would be strange to have that pain for some many years.  If you like you could try to do a cleanout protocol of MiraLAX.  Typically you do a scoop in 8 ounces of water and do this every day until you have a significant bowel movement which usually takes about 3 to 4 days.  You could also try a fleets enema.

## 2019-10-04 NOTE — ED Notes (Signed)
Pt transported to CT Scan

## 2019-10-06 ENCOUNTER — Telehealth (INDEPENDENT_AMBULATORY_CARE_PROVIDER_SITE_OTHER): Payer: Medicare HMO | Admitting: Cardiovascular Disease

## 2019-10-06 ENCOUNTER — Encounter: Payer: Self-pay | Admitting: Cardiovascular Disease

## 2019-10-06 VITALS — BP 137/69 | HR 84 | Temp 96.8°F | Resp 17 | Wt 166.0 lb

## 2019-10-06 DIAGNOSIS — F0390 Unspecified dementia without behavioral disturbance: Secondary | ICD-10-CM | POA: Diagnosis not present

## 2019-10-06 DIAGNOSIS — Z8673 Personal history of transient ischemic attack (TIA), and cerebral infarction without residual deficits: Secondary | ICD-10-CM | POA: Diagnosis not present

## 2019-10-06 DIAGNOSIS — Z7982 Long term (current) use of aspirin: Secondary | ICD-10-CM | POA: Diagnosis not present

## 2019-10-06 DIAGNOSIS — I429 Cardiomyopathy, unspecified: Secondary | ICD-10-CM | POA: Diagnosis not present

## 2019-10-06 DIAGNOSIS — Z87891 Personal history of nicotine dependence: Secondary | ICD-10-CM

## 2019-10-06 DIAGNOSIS — I4821 Permanent atrial fibrillation: Secondary | ICD-10-CM

## 2019-10-06 DIAGNOSIS — Z7901 Long term (current) use of anticoagulants: Secondary | ICD-10-CM | POA: Diagnosis not present

## 2019-10-06 DIAGNOSIS — I509 Heart failure, unspecified: Secondary | ICD-10-CM

## 2019-10-06 DIAGNOSIS — I63232 Cerebral infarction due to unspecified occlusion or stenosis of left carotid arteries: Secondary | ICD-10-CM

## 2019-10-06 DIAGNOSIS — I959 Hypotension, unspecified: Secondary | ICD-10-CM | POA: Diagnosis not present

## 2019-10-06 DIAGNOSIS — R278 Other lack of coordination: Secondary | ICD-10-CM | POA: Diagnosis not present

## 2019-10-06 DIAGNOSIS — M6281 Muscle weakness (generalized): Secondary | ICD-10-CM | POA: Diagnosis not present

## 2019-10-06 DIAGNOSIS — Z09 Encounter for follow-up examination after completed treatment for conditions other than malignant neoplasm: Secondary | ICD-10-CM

## 2019-10-06 DIAGNOSIS — R2689 Other abnormalities of gait and mobility: Secondary | ICD-10-CM | POA: Diagnosis not present

## 2019-10-06 NOTE — Patient Instructions (Signed)
Medication Instructions:  Continue all current medications.   Labwork: none  Testing/Procedures: none  Follow-Up: 6 months   Any Other Special Instructions Will Be Listed Below (If Applicable).   If you need a refill on your cardiac medications before your next appointment, please call your pharmacy.  

## 2019-10-06 NOTE — Progress Notes (Signed)
Virtual Visit via Telephone Note   This visit type was conducted due to national recommendations for restrictions regarding the COVID-19 Pandemic (e.g. social distancing) in an effort to limit this patient's exposure and mitigate transmission in our community.  Due to his co-morbid illnesses, this patient is at least at moderate risk for complications without adequate follow up.  This format is felt to be most appropriate for this patient at this time.  The patient did not have access to video technology/had technical difficulties with video requiring transitioning to audio format only (telephone).  All issues noted in this document were discussed and addressed.  No physical exam could be performed with this format.  Please refer to the patient's chart for his  consent to telehealth for [facility].   The patient was identified using 2 identifiers.  Date:  10/06/2019   ID:  [name], DOB 03-Jun-1930, MRN [id number]  Patient location: Skilled nursing facility Provider Location: Office  PCP:  Rosita Fire, MD  Cardiologist:  No primary care provider on file.  Electrophysiologist:  None   Evaluation Performed:  Follow-Up Visit  Chief Complaint: Atrial fibrillation, cardiomyopathy  History of Present Illness:    RONDRICK Wright is a 86 y.o. male with atrial fibrillation, HTN, dementia, cardiomyopathy, and CVA.  In summary, he was hospitalized for CVA in March 2020 and was not on anticoagulation for atrial fibrillation at that time. I reviewed head and neck CT's. He was apparently given lytics at [facility] and then transferred to [facility].   He is on Xarelto.  He has advanced dementia.  He was evaluated in the ED for syncope on 10/03/2019.  I have personally reviewed all documentation, labs, radiographic and cardiovascular studies, and independently interpreted all ECG's.  Blood pressure was 95/59 upon arrival.  He is no longer on midodrine.  He was given IV fluids.  CT renal  stone study was suggestive of possible pneumonia and he was started on antibiotics.  Labs showed BUN 25, creatinine 1.22, sodium 137, hemoglobin 12.4, platelets 142.  I spoke with Sunday Spillers, the floor supervisor. He denies chest pain, shortness of breath, lightheadedness, bleeding problems, and leg swelling. HR is controlled without AV nodal blocking therapy.  He was apparently more hypotensive earlier in the month and his PCP increase the dose of midodrine and he is doing much better with respect to that.   Past Medical History:  Diagnosis Date  . [name] deficiency 2011  . Bladder cancer ([place])   . CAD (coronary artery disease)   . Dementia ([place])   . Hx of colonic polyp   . Hyperlipidemia   . Memory loss 2011   Past Surgical History:  Procedure Laterality Date  . CHOLECYSTECTOMY       Current Meds  Medication Sig  . acetaminophen (TYLENOL) 500 MG tablet Take 500 mg by mouth every 6 (six) hours as needed for mild pain, fever or headache.  Marland Kitchen atorvastatin (LIPITOR) 20 MG tablet Take 1 tablet (20 mg total) by mouth daily at 6 PM.  . doxycycline (VIBRAMYCIN) 100 MG capsule Take 1 capsule (100 mg total) by mouth 2 (two) times daily. One po bid x 7 days  . midodrine (PROAMATINE) 5 MG tablet Take 5 mg by mouth 2 (two) times daily with a meal.  . rivaroxaban (XARELTO) 20 MG TABS tablet Take 20 mg by mouth daily with supper.     Allergies:   Patient has no known allergies.   Social History   Tobacco  Use  . Smoking status: Former Smoker    Quit date: 12/29/1973    Years since quitting: 45.8  . Smokeless tobacco: Never Used  Substance Use Topics  . Alcohol use: No  . Drug use: Never     Family Hx: The patient's family history includes Coronary artery disease in an other family member; Dementia in an other family member.  ROS:   Please see the history of present illness.     All other systems reviewed and are negative.   Prior CV studies:   The following studies were reviewed  today:  Echo 09/10/18:  1. The left ventricle has mild-moderately reduced systolic function, with an ejection fraction of 40-45%. The cavity size was normal. There is mildly increased left ventricular wall thickness. Left ventricular diastolic Doppler parameters are  indeterminate. 2. Hypokinesis of the apical, mid to apical anteroseptal, mid-apical inferoseptal, apical anterior, and apical anteroseptal myocardium. 3. The right ventricle has normal systolic function. The cavity was normal. There is no increase in right ventricular wall thickness. 4. Left atrial size was mildly dilated. 5. Right atrial size was severely dilated. 6. The mitral valve is normal in structure. 7. The tricuspid valve is normal in structure. 8. The aortic valve is tricuspid Aortic valve regurgitation is mild by color flow Doppler. 9. The pulmonic valve was normal in structure. Pulmonic valve regurgitation is mild by color flow Doppler. 10. Large patent foramen ovale. 11. Cannot rule out PFO by color flow Doppler.  Labs/Other Tests and Data Reviewed:    EKG:  An ECG dated 10/03/19 was personally reviewed today and demonstrated:  Rate controlled atrial fibrillation with late R wave transition  Recent Labs: 02/27/2019: ALT 11 10/03/2019: BUN 25; Creatinine, Ser 1.22; Hemoglobin 12.4; Platelets 142; Potassium 3.6; Sodium 137   Recent Lipid Panel Lab Results  Component Value Date/Time   CHOL 171 09/12/2018 03:54 AM   TRIG 89 09/12/2018 03:54 AM   HDL 35 (L) 09/12/2018 03:54 AM   CHOLHDL 4.9 09/12/2018 03:54 AM   LDLCALC 118 (H) 09/12/2018 03:54 AM    Wt Readings from Last 3 Encounters:  10/06/19 166 lb (75.3 kg)  10/03/19 167 lb (75.8 kg)  05/22/19 155 lb 9.6 oz (70.6 kg)     Objective:    Vital Signs:  BP 137/69   Pulse 84   Temp (!) 96.8 F (36 C)   Resp 17   Wt 166 lb (75.3 kg)   BMI 23.15 kg/m    VITAL SIGNS:  reviewed  ASSESSMENT & PLAN:    1. Permanent atrial fibrillation:  Anticoagulated with Xarelto. Reportedly symptomatically stable. HR is controlled.  2. Cardiomyopathy/chronic systolic HF: Wall motion abnormalities suggestive of underlying CAD. Given advanced age, dementia, and other comorbidities, I will not pursue an ischemic evaluation and I plan to manage medically. On statin. No ASA as he is on Xarelto. No reported evidence of hypervolemia.  3. History of CVA: On Xarelto. PFO noted. Not certain he is a good candidate for closure device given frailty and additional comorbidities. Also on statin.  4. Hypotension: BP normal today on midodrine 5 mg bid.      COVID-19 Education: The signs and symptoms of COVID-19 were discussed with the patient and how to seek care for testing (follow up with PCP or arrange E-visit).  The importance of social distancing was discussed today.  Time:   Today, I have spent 25 minutes with the patient with telehealth technology discussing the above problems.  Medication Adjustments/Labs and Tests Ordered: Current medicines are reviewed at length with the patient today.  Concerns regarding medicines are outlined above.   Tests Ordered: No orders of the defined types were placed in this encounter.   Medication Changes: No orders of the defined types were placed in this encounter.   Follow Up:  Virtual Visit  in 6 month(s)  Signed, Kate Sable, MD  10/06/2019 9:41 AM    [place]

## 2019-10-07 DIAGNOSIS — U071 COVID-19: Secondary | ICD-10-CM | POA: Diagnosis not present

## 2019-10-07 DIAGNOSIS — Z20828 Contact with and (suspected) exposure to other viral communicable diseases: Secondary | ICD-10-CM | POA: Diagnosis not present

## 2019-10-08 DIAGNOSIS — M6281 Muscle weakness (generalized): Secondary | ICD-10-CM | POA: Diagnosis not present

## 2019-10-08 DIAGNOSIS — R2689 Other abnormalities of gait and mobility: Secondary | ICD-10-CM | POA: Diagnosis not present

## 2019-10-08 DIAGNOSIS — R278 Other lack of coordination: Secondary | ICD-10-CM | POA: Diagnosis not present

## 2019-10-09 DIAGNOSIS — M6281 Muscle weakness (generalized): Secondary | ICD-10-CM | POA: Diagnosis not present

## 2019-10-09 DIAGNOSIS — R278 Other lack of coordination: Secondary | ICD-10-CM | POA: Diagnosis not present

## 2019-10-13 DIAGNOSIS — R278 Other lack of coordination: Secondary | ICD-10-CM | POA: Diagnosis not present

## 2019-10-13 DIAGNOSIS — R2689 Other abnormalities of gait and mobility: Secondary | ICD-10-CM | POA: Diagnosis not present

## 2019-10-13 DIAGNOSIS — M6281 Muscle weakness (generalized): Secondary | ICD-10-CM | POA: Diagnosis not present

## 2019-10-15 DIAGNOSIS — M6281 Muscle weakness (generalized): Secondary | ICD-10-CM | POA: Diagnosis not present

## 2019-10-15 DIAGNOSIS — R278 Other lack of coordination: Secondary | ICD-10-CM | POA: Diagnosis not present

## 2019-10-15 DIAGNOSIS — R2689 Other abnormalities of gait and mobility: Secondary | ICD-10-CM | POA: Diagnosis not present

## 2019-10-17 DIAGNOSIS — R278 Other lack of coordination: Secondary | ICD-10-CM | POA: Diagnosis not present

## 2019-10-17 DIAGNOSIS — M6281 Muscle weakness (generalized): Secondary | ICD-10-CM | POA: Diagnosis not present

## 2019-10-19 DIAGNOSIS — G301 Alzheimer's disease with late onset: Secondary | ICD-10-CM | POA: Diagnosis not present

## 2019-10-19 DIAGNOSIS — N182 Chronic kidney disease, stage 2 (mild): Secondary | ICD-10-CM | POA: Diagnosis not present

## 2019-10-20 DIAGNOSIS — M6281 Muscle weakness (generalized): Secondary | ICD-10-CM | POA: Diagnosis not present

## 2019-10-20 DIAGNOSIS — R2689 Other abnormalities of gait and mobility: Secondary | ICD-10-CM | POA: Diagnosis not present

## 2019-10-20 DIAGNOSIS — Z20828 Contact with and (suspected) exposure to other viral communicable diseases: Secondary | ICD-10-CM | POA: Diagnosis not present

## 2019-10-20 DIAGNOSIS — U071 COVID-19: Secondary | ICD-10-CM | POA: Diagnosis not present

## 2019-10-20 DIAGNOSIS — R278 Other lack of coordination: Secondary | ICD-10-CM | POA: Diagnosis not present

## 2019-10-27 DIAGNOSIS — Z7689 Persons encountering health services in other specified circumstances: Secondary | ICD-10-CM | POA: Diagnosis not present

## 2019-10-27 DIAGNOSIS — Z20828 Contact with and (suspected) exposure to other viral communicable diseases: Secondary | ICD-10-CM | POA: Diagnosis not present

## 2019-10-27 DIAGNOSIS — U071 COVID-19: Secondary | ICD-10-CM | POA: Diagnosis not present

## 2019-11-18 DIAGNOSIS — E785 Hyperlipidemia, unspecified: Secondary | ICD-10-CM | POA: Diagnosis not present

## 2019-11-18 DIAGNOSIS — N182 Chronic kidney disease, stage 2 (mild): Secondary | ICD-10-CM | POA: Diagnosis not present

## 2019-11-20 ENCOUNTER — Other Ambulatory Visit: Payer: Self-pay

## 2019-11-20 ENCOUNTER — Encounter: Payer: Self-pay | Admitting: Adult Health

## 2019-11-20 ENCOUNTER — Ambulatory Visit (INDEPENDENT_AMBULATORY_CARE_PROVIDER_SITE_OTHER): Payer: Medicare HMO | Admitting: Adult Health

## 2019-11-20 VITALS — BP 109/72 | HR 67 | Temp 96.9°F | Wt 163.0 lb

## 2019-11-20 DIAGNOSIS — Z7689 Persons encountering health services in other specified circumstances: Secondary | ICD-10-CM | POA: Diagnosis not present

## 2019-11-20 DIAGNOSIS — Z8673 Personal history of transient ischemic attack (TIA), and cerebral infarction without residual deficits: Secondary | ICD-10-CM

## 2019-11-20 DIAGNOSIS — I4821 Permanent atrial fibrillation: Secondary | ICD-10-CM | POA: Diagnosis not present

## 2019-11-20 DIAGNOSIS — E785 Hyperlipidemia, unspecified: Secondary | ICD-10-CM

## 2019-11-20 DIAGNOSIS — F0390 Unspecified dementia without behavioral disturbance: Secondary | ICD-10-CM

## 2019-11-20 NOTE — Progress Notes (Signed)
I agree with the above plan 

## 2019-11-20 NOTE — Progress Notes (Signed)
[facility] [address] [place]. [place] [address] [phone]       OFFICE FOLLOW UP NOTE  [name] Date of Birth:  Aug 19, 1929 Medical Record Number:  [id number]    Reason for Referral: Left MCA stroke follow-up   CHIEF COMPLAINT:  Chief Complaint  Patient presents with  . Follow-up    tx rm pt here for a f/u on a stroke. Pt is here with     HPI:  Today, 11/20/2019, [name] returns for stroke follow-up.  [facility] and accompanied by facility caregiver, Star.  He has been stable from a stroke standpoint since prior visit.  No residual stroke deficits.  History of advanced dementia which has been stable without behavioral concerns.  Continues on Xarelto and atorvastatin for secondary stroke prevention and history of atrial fibrillation.  Blood pressure today 109/72.  He did have recent ED admission on 10/03/2019 for syncopal event with hypotension and possible pneumonia treated with antibiotics.  Previously midodrine discontinued and has been restarted with stable blood pressures routinely monitored by facility.  No concerns at this time.     History provided for reference purposes only Update 05/22/2019: [name] is a 86 year old male who is being seen today for stroke follow-up accompanied by facility caregiver.  He continues to reside at [facility] long-term care facility.  He has been stable from a stroke standpoint.  Continues to ambulate with rolling walker without any recent falls.  History of dementia with fluctuating symptoms but overall stable.  Continues on Xarelto without bleeding or bruising.  Continues on atorvastatin without myalgias.  Blood pressure today 121/59. He has had multiple ED admissions with finding of hypotension.  Continues on midodrine 2.5 mg twice daily.  No further concerns at this time.   Virtual visit 10/23/2018: He has been doing well since hospital discharge and has made  improvement regarding right-sided weakness.  80 of history is provided by [name], SNF director, due to patient's underlying dementia.  He is currently ambulating with rolling walker without any recent falls but goal is for him to ambulate independently without assistive device as he was prior.  He continues to participate in PT/OT/ST and SNF director is requesting approval for additional therapy sessions.  He does have underlying dementia and per SNF staff, this has been stable without any worsening.  It was recommended to be discharged on Eliquis but he is currently on Xarelto 20 mg daily for undetermined reasons (?  Insurance reasons).  Denies any bleeding or bruising on Xarelto dose.  He continues on atorvastatin without side effects myalgias.  Blood pressure obtained during therapy session which was satisfactory at 126/66.  No further concerns at this time.  Denies new or worsening stroke/TIA symptoms.  Stroke admission 09/09/2018: [name] a 86 y.o.malewith history of A. fib not on anticoagulation x1 year, hygroma, and significant dementia who presented from SNF to [facility] with right-sided weakness. CT head negative for acute abnormality. CTA head showed emergent LVO and left ICA with severe right V4 stenosis.  CTA neck negative for emergent LVO but did show right subclavian artery occlusion.  He received IV TPA and he [facility] and was transferred to [facility] for further treatment and evaluation.  CT perfusion nondiagnostic.  MRI brain reviewed and showed several small acute infarcts involving left pre-and postcentral gyrus, left insula, frontal operculum and medial temporal lobe infarcts.  2D echo showed an EF of 40 to  45% with a bicoronal hypokinesis and left atrium mildly dilated.  Infarcts due to left distal ICA occlusion secondary to known A. fib not on anticoagulation.  Recommended to initiate Eliquis 5 mg twice daily for atrial fibrillation and secondary stroke  prevention.  BP stable and recommended long-term BP goal 1 30-1 50 due to left distal ICA occlusion.  LDL 118 and initiated atorvastatin 20 mg daily.  He was discharged back to SNF with recommendations of 24/7 supervision and ongoing therapies on 09/14/2018.      ROS:   14 system review of systems performed and negative with exception of confusion and memory loss  PMH:  Past Medical History:  Diagnosis Date  . [name] deficiency 2011  . Bladder cancer ([place])   . CAD (coronary artery disease)   . Dementia ([place])   . Hx of colonic polyp   . Hyperlipidemia   . Memory loss 2011    PSH:  Past Surgical History:  Procedure Laterality Date  . CHOLECYSTECTOMY      Social History:  Social History   Socioeconomic History  . Marital status: Married    Spouse name: Not on file  . Number of children: Not on file  . Years of education: Not on file  . Highest education level: Not on file  Occupational History  . Not on file  Tobacco Use  . Smoking status: Former Smoker    Quit date: 12/29/1973    Years since quitting: 45.9  . Smokeless tobacco: Never Used  Substance and Sexual Activity  . Alcohol use: No  . Drug use: Never  . Sexual activity: Not Currently  Other Topics Concern  . Not on file  Social History Narrative   Regular Exercise- No   Social Determinants of Health   Financial Resource Strain:   . Difficulty of Paying Living Expenses:   Food Insecurity:   . Worried About Charity fundraiser in the Last Year:   . Arboriculturist in the Last Year:   Transportation Needs:   . Film/video editor (Medical):   Marland Kitchen Lack of Transportation (Non-Medical):   Physical Activity:   . Days of Exercise per Week:   . Minutes of Exercise per Session:   Stress:   . Feeling of Stress :   Social Connections:   . Frequency of Communication with Friends and Family:   . Frequency of Social Gatherings with Friends and Family:   . Attends Religious Services:   . Active Member of Clubs or  Organizations:   . Attends Archivist Meetings:   Marland Kitchen Marital Status:   Intimate Partner Violence:   . Fear of Current or Ex-Partner:   . Emotionally Abused:   Marland Kitchen Physically Abused:   . Sexually Abused:     Family History:  Family History  Problem Relation Age of Onset  . Coronary artery disease Other   . Dementia Other     Medications:   Current Outpatient Medications on File Prior to Visit  Medication Sig Dispense Refill  . acetaminophen (TYLENOL) 500 MG tablet Take 500 mg by mouth every 6 (six) hours as needed for mild pain, fever or headache.    Marland Kitchen atorvastatin (LIPITOR) 20 MG tablet Take 1 tablet (20 mg total) by mouth daily at 6 PM.    . doxycycline (VIBRAMYCIN) 100 MG capsule Take 1 capsule (100 mg total) by mouth 2 (two) times daily. One po bid x 7 days 14 capsule 0  . midodrine (PROAMATINE)  5 MG tablet Take 5 mg by mouth 2 (two) times daily with a meal.    . rivaroxaban (XARELTO) 20 MG TABS tablet Take 20 mg by mouth daily with supper.     No current facility-administered medications on file prior to visit.    Allergies:  No Known Allergies   Physical Exam  Vitals:   11/20/19 0951  BP: 109/72  Pulse: 67  Temp: (!) 96.9 F (36.1 C)  Weight: 163 lb (73.9 kg)   Body mass index is 22.73 kg/m. No exam data present   General: well developed, well nourished, very pleasant elderly Caucasian male, seated, in no evident distress Head: head normocephalic and atraumatic.   Neck: supple with no carotid or supraclavicular bruits Cardiovascular: irregular rate and rhythm, no murmurs Musculoskeletal: no deformity Skin:  no rash/petichiae Vascular:  Normal pulses all extremities   Neurologic Exam Mental Status: Awake and fully alert.  Oriented to self but disoriented to place and time. Recent and remote memory diminished. Attention span and concentration intact and fund of knowledge diminished. Mood and affect appropriate and very pleasant.  Cranial Nerves:  Pupils equal, briskly reactive to light. Extraocular movements full without nystagmus. Visual fields full to confrontation. Hearing intact. Facial sensation intact. Face, tongue, palate moves normally and symmetrically.  Motor: Normal bulk and tone. Normal strength in all tested extremity muscles Sensory.: intact to touch , pinprick , position and vibratory sensation.  Coordination: Rapid alternating movements normal in all extremities. Finger-to-nose and heel-to-shin performed accurately bilaterally. Gait and Station: Arises from chair without difficulty. Stance is normal. Gait demonstrates normal stride length and balance with use of rolling walker Reflexes: 1+ and symmetric. Toes downgoing.      ASSESSMENT: [name] is a 86 y.o. year old male here with left MCA infarct due to left distal ICA occlusion secondary to known atrial fibrillation not on anticoagulation on 09/10/2018.  Initially placed on Eliquis but due to ?  Insurance reasons, switch to Xarelto.  Vascular risk factors include HTN, HLD and atrial fibrillation.  He has been stable from a stroke standpoint without residual deficits or reoccurring symptoms.  Underlying history of dementia which has been stable without behavioral concerns.    PLAN:  1. Left MCA infarct: Continue Xarelto (rivaroxaban) daily  and atorvastatin 20 mg for secondary stroke prevention. Maintain strict control of hypertension with blood pressure goal below 130/90, diabetes with hemoglobin A1c goal below 6.5% and cholesterol with LDL cholesterol (bad cholesterol) goal below 70 mg/dL.  I also advised the patient to eat a healthy diet with plenty of whole grains, cereals, fruits and vegetables, exercise regularly with at least 30 minutes of continuous activity daily and maintain ideal body weight. 2. Hypotension: History of HTN.  Continues on midodrine with stable blood pressure today.  Ongoing monitoring and management via facility/PCP/cardiology 3. HLD:  Continue atorvastatin continue to follow PCP for monitoring, prescribing and management 4. Atrial fibrillation: Continuation of Xarelto and follow-up with PCP/cardiology for ongoing management 5. Advanced dementia without behavioral disturbance: Stable without worsening.  Continues to be monitored by PCP and facility.   Overall stable from stroke standpoint without further recommendations or need for continued follow-up.  Advised to follow-up as needed and to call with any questions or concerns related to prior stroke   I spent 22 minutes of face-to-face and non-face-to-face time with patient and facility caregiver.  This included previsit chart review, lab review, study review, order entry, electronic health record documentation, patient education  Frann Rider, AGNP-BC  [facility] [address] [place], [place] [address]  Phone [phone] Fax [phone] Note: This document was prepared with digital dictation and possible smart phrase technology. Any transcriptional errors that result from this process are unintentional.

## 2019-11-20 NOTE — Patient Instructions (Signed)
Continue Xarelto (rivaroxaban) daily  and atorvastatin for secondary stroke prevention  Continue to follow up with PCP regarding cholesterol and blood pressure management   Continue to follow with cardiology for atrial fibrillation and Xarelto management  Maintain strict control of hypertension with blood pressure goal below 130/90, diabetes with hemoglobin A1c goal below 6.5% and cholesterol with LDL cholesterol (bad cholesterol) goal below 70 mg/dL. I also advised the patient to eat a healthy diet with plenty of whole grains, cereals, fruits and vegetables, exercise regularly and maintain ideal body weight.  Doing well from a stroke standpoint without further recommendations or need for ongoing follow-up visits.  Please do not hesitate to call in the future with any stroke related questions or concerns.       Thank you for coming to see [place] at [facility]. I hope we have been able to provide you high quality care today.  You may receive a patient satisfaction survey over the next few weeks. We would appreciate your feedback and comments so that we may continue to improve ourselves and the health of our patients.

## 2019-11-25 DIAGNOSIS — Z7689 Persons encountering health services in other specified circumstances: Secondary | ICD-10-CM | POA: Diagnosis not present

## 2019-11-30 ENCOUNTER — Other Ambulatory Visit: Payer: Self-pay

## 2019-11-30 ENCOUNTER — Encounter (HOSPITAL_COMMUNITY): Payer: Self-pay | Admitting: Emergency Medicine

## 2019-11-30 ENCOUNTER — Emergency Department (HOSPITAL_COMMUNITY): Payer: Medicare HMO

## 2019-11-30 ENCOUNTER — Emergency Department (HOSPITAL_COMMUNITY)
Admission: EM | Admit: 2019-11-30 | Discharge: 2019-12-01 | Disposition: A | Discharge status: Home / Self Care | Payer: Medicare HMO | Attending: Emergency Medicine | Admitting: Emergency Medicine

## 2019-11-30 DIAGNOSIS — M25551 Pain in right hip: Secondary | ICD-10-CM | POA: Insufficient documentation

## 2019-11-30 DIAGNOSIS — Y9301 Activity, walking, marching and hiking: Secondary | ICD-10-CM | POA: Insufficient documentation

## 2019-11-30 DIAGNOSIS — R4182 Altered mental status, unspecified: Secondary | ICD-10-CM | POA: Diagnosis not present

## 2019-11-30 DIAGNOSIS — R52 Pain, unspecified: Secondary | ICD-10-CM | POA: Diagnosis not present

## 2019-11-30 DIAGNOSIS — Z87891 Personal history of nicotine dependence: Secondary | ICD-10-CM | POA: Insufficient documentation

## 2019-11-30 DIAGNOSIS — Z7901 Long term (current) use of anticoagulants: Secondary | ICD-10-CM | POA: Insufficient documentation

## 2019-11-30 DIAGNOSIS — M542 Cervicalgia: Secondary | ICD-10-CM | POA: Insufficient documentation

## 2019-11-30 DIAGNOSIS — R109 Unspecified abdominal pain: Secondary | ICD-10-CM | POA: Insufficient documentation

## 2019-11-30 DIAGNOSIS — W010XXA Fall on same level from slipping, tripping and stumbling without subsequent striking against object, initial encounter: Secondary | ICD-10-CM | POA: Diagnosis not present

## 2019-11-30 DIAGNOSIS — Z8551 Personal history of malignant neoplasm of bladder: Secondary | ICD-10-CM | POA: Diagnosis not present

## 2019-11-30 DIAGNOSIS — Z79899 Other long term (current) drug therapy: Secondary | ICD-10-CM | POA: Insufficient documentation

## 2019-11-30 DIAGNOSIS — Y92121 Bathroom in nursing home as the place of occurrence of the external cause: Secondary | ICD-10-CM | POA: Diagnosis not present

## 2019-11-30 DIAGNOSIS — S79911A Unspecified injury of right hip, initial encounter: Secondary | ICD-10-CM | POA: Diagnosis not present

## 2019-11-30 DIAGNOSIS — I2510 Atherosclerotic heart disease of native coronary artery without angina pectoris: Secondary | ICD-10-CM | POA: Insufficient documentation

## 2019-11-30 DIAGNOSIS — Y999 Unspecified external cause status: Secondary | ICD-10-CM | POA: Insufficient documentation

## 2019-11-30 DIAGNOSIS — S0990XA Unspecified injury of head, initial encounter: Secondary | ICD-10-CM | POA: Diagnosis not present

## 2019-11-30 DIAGNOSIS — R531 Weakness: Secondary | ICD-10-CM | POA: Diagnosis not present

## 2019-11-30 DIAGNOSIS — W19XXXA Unspecified fall, initial encounter: Secondary | ICD-10-CM

## 2019-11-30 DIAGNOSIS — F0390 Unspecified dementia without behavioral disturbance: Secondary | ICD-10-CM | POA: Diagnosis not present

## 2019-11-30 DIAGNOSIS — I959 Hypotension, unspecified: Secondary | ICD-10-CM | POA: Diagnosis not present

## 2019-11-30 LAB — CBC WITH DIFFERENTIAL/PLATELET
Abs Immature Granulocytes: 0.01 10*3/uL (ref 0.00–0.07)
Basophils Absolute: 0 10*3/uL (ref 0.0–0.1)
Basophils Relative: 1 %
Eosinophils Absolute: 0.2 10*3/uL (ref 0.0–0.5)
Eosinophils Relative: 3 %
HCT: 41.3 % (ref 39.0–52.0)
Hemoglobin: 13.6 g/dL (ref 13.0–17.0)
Immature Granulocytes: 0 %
Lymphocytes Relative: 25 %
Lymphs Abs: 1.3 10*3/uL (ref 0.7–4.0)
MCH: 32 pg (ref 26.0–34.0)
MCHC: 32.9 g/dL (ref 30.0–36.0)
MCV: 97.2 fL (ref 80.0–100.0)
Monocytes Absolute: 0.5 10*3/uL (ref 0.1–1.0)
Monocytes Relative: 10 %
Neutro Abs: 3.1 10*3/uL (ref 1.7–7.7)
Neutrophils Relative %: 61 %
Platelets: 141 10*3/uL — ABNORMAL LOW (ref 150–400)
RBC: 4.25 MIL/uL (ref 4.22–5.81)
RDW: 12.4 % (ref 11.5–15.5)
WBC: 5.1 10*3/uL (ref 4.0–10.5)
nRBC: 0 % (ref 0.0–0.2)

## 2019-11-30 LAB — BASIC METABOLIC PANEL
Anion gap: 9 (ref 5–15)
BUN: 26 mg/dL — ABNORMAL HIGH (ref 8–23)
CO2: 29 mmol/L (ref 22–32)
Calcium: 9.2 mg/dL (ref 8.9–10.3)
Chloride: 103 mmol/L (ref 98–111)
Creatinine, Ser: 1.21 mg/dL (ref 0.61–1.24)
GFR calc Af Amer: >60 mL/min (ref >60)
GFR calc non Af Amer: 54 mL/min — ABNORMAL LOW (ref >60)
Glucose, Bld: 102 mg/dL — ABNORMAL HIGH (ref 70–99)
Potassium: 3.9 mmol/L (ref 3.5–5.1)
Sodium: 141 mmol/L (ref 135–145)

## 2019-11-30 LAB — URINALYSIS, ROUTINE W REFLEX MICROSCOPIC
Bilirubin Urine: NEGATIVE
Glucose, UA: NEGATIVE mg/dL
Hgb urine dipstick: NEGATIVE
Ketones, ur: NEGATIVE mg/dL
Leukocytes,Ua: NEGATIVE
Nitrite: NEGATIVE
Protein, ur: NEGATIVE mg/dL
Specific Gravity, Urine: 1.01 (ref 1.005–1.030)
pH: 7 (ref 5.0–8.0)

## 2019-11-30 NOTE — ED Provider Notes (Signed)
[facility] EMERGENCY DEPARTMENT Provider Note   CSN: [id number] Arrival date & time: 11/30/19  1858     History Chief Complaint  Patient presents with  . [name] is a 86 y.o. male.  HPI      [name] is a 86 y.o. male with past medical history of dementia, prior stroke and history of atrial fibrillation (anticoagulated with Xarelto) [facility].  He presents to the Emergency Department for evaluation of a fall that occurred earlier today.  Patient states that he was carrying some things in his hands coming out of the bathroom and believes that he tripped and fell although he is unclear about the history.  He states that he was hurting in his right side and hip area after the fall, but he denies pain at this time.  Have spoken with the nurse who cares for him at [facility] and she states that he had an unwitnessed fall in the living room today and was complaining of right side hip, flank pain and neck pain.  She also states that he seems to have some change in mental status over the last several days and seems to be angry which is not his baseline.  No reported fever, chills, recent illness, LOC, headache, vomiting, or change in medications.  Patient denies any symptoms at this time.  Past Medical History:  Diagnosis Date  . [name] deficiency 2011  . Bladder cancer ([place])   . CAD (coronary artery disease)   . Dementia ([place])   . Hx of colonic polyp   . Hyperlipidemia   . Memory loss 2011    Patient Active Problem List   Diagnosis Date Noted  . Hygroma 09/10/2018  . 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 Chest pain 12/29/2013  . Atrial fibrillation ([place]) 12/29/2013  . VERTIGO 02/04/2010  . [name] DEFICIENCY 01/31/2010  . Memory loss 01/27/2010  . PARESTHESIA 01/27/2010  . Hyperlipidemia 11/02/2009  . CORONARY ARTERY DISEASE 11/02/2009  . SINUSITIS, ACUTE 11/02/2009  . EPISTAXIS  11/02/2009  . COLONIC POLYPS, HX OF 11/02/2009    Past Surgical History:  Procedure Laterality Date  . CHOLECYSTECTOMY         Family History  Problem Relation Age of Onset  . Coronary artery disease Other   . Dementia Other     Social History   Tobacco Use  . Smoking status: Former Smoker    Quit date: 12/29/1973    Years since quitting: 45.9  . Smokeless tobacco: Never Used  Substance Use Topics  . Alcohol use: No  . Drug use: Never    Home Medications Prior to Admission medications   Medication Sig Start Date End Date Taking? Authorizing Provider  acetaminophen (TYLENOL) 500 MG tablet Take 500 mg by mouth every 6 (six) hours as needed for mild pain, fever or headache.   Yes [provider]  atorvastatin (LIPITOR) 20 MG tablet Take 1 tablet (20 mg total) by mouth daily at 6 PM. 09/14/18  Yes Rinehuls, Shanon Brow L, PA-C  midodrine (PROAMATINE) 5 MG tablet Take 5 mg by mouth 2 (two) times daily with a meal.   Yes [provider]  rivaroxaban (XARELTO) 20 MG TABS tablet Take 20 mg by mouth daily with supper.   Yes [provider]  doxycycline (VIBRAMYCIN) 100 MG capsule Take 1 capsule (100 mg total) by mouth 2 (two) times daily. One po  bid x 7 days Patient not taking: Reported on 11/30/2019 10/04/19   Deno Etienne, DO    Allergies    Patient has no known allergies.  Review of Systems   Review of Systems  Constitutional: Negative for chills and fever.  Respiratory: Negative for shortness of breath.   Cardiovascular: Negative for chest pain.  Gastrointestinal: Negative for abdominal pain, diarrhea, nausea and vomiting.  Genitourinary: Positive for flank pain. Negative for decreased urine volume, difficulty urinating and dysuria.  Musculoskeletal: Negative for arthralgias, back pain, joint swelling, neck pain and neck stiffness.  Skin: Negative for color change and wound.  Neurological: Negative for dizziness, facial asymmetry, weakness, numbness and  headaches.  Psychiatric/Behavioral: Negative for agitation.  All other systems reviewed and are negative.   Physical Exam Updated Vital Signs BP 119/60   Pulse 76   Temp 98.4 F (36.9 C)   Resp 18   Ht 5\' 11"  (1.803 m)   Wt 73.9 kg   SpO2 100%   BMI 22.72 kg/m   Physical Exam Vitals and nursing note reviewed.  Constitutional:      Appearance: Normal appearance. He is not ill-appearing or toxic-appearing.  HENT:     Head: Atraumatic.  Eyes:     Extraocular Movements: Extraocular movements intact.     Pupils: Pupils are equal, round, and reactive to light.  Neck:     Thyroid: No thyromegaly.     Meningeal: Kernig's sign absent.  Cardiovascular:     Rate and Rhythm: Normal rate and regular rhythm.     Pulses: Normal pulses.  Pulmonary:     Effort: Pulmonary effort is normal.     Breath sounds: Normal breath sounds. No wheezing.  Chest:     Chest wall: No tenderness.  Abdominal:     Palpations: Abdomen is soft.     Tenderness: There is no abdominal tenderness. There is no right CVA tenderness, left CVA tenderness, guarding or rebound.  Musculoskeletal:        General: No tenderness or signs of injury. Normal range of motion.     Cervical back: Normal range of motion and neck supple. No tenderness.     Comments: Right flank non tender.  No midline tenderness  Skin:    General: Skin is warm.     Capillary Refill: Capillary refill takes less than 2 seconds.     Findings: No bruising, erythema or rash.  Neurological:     General: No focal deficit present.     Mental Status: He is alert.     Motor: No weakness.     Comments: Patient is oriented to person but not to place or time.     ED Results / Procedures / Treatments   Labs (all labs ordered are listed, but only abnormal results are displayed) Labs Reviewed - No data to display  EKG None  Radiology CT Head Wo Contrast  Result Date: 11/30/2019 CLINICAL DATA:  Right foot drop, fell EXAM: CT HEAD WITHOUT  CONTRAST TECHNIQUE: Contiguous axial images were obtained from the base of the skull through the vertex without intravenous contrast. COMPARISON:  03/24/2019 FINDINGS: Brain: Diffuse age-appropriate cerebral atrophy unchanged. No acute infarct or hemorrhage. Lateral ventricles and midline structures are unremarkable. No acute extra-axial fluid collections. No mass effect. Vascular: No hyperdense vessel or unexpected calcification. Skull: Normal. Negative for fracture or focal lesion. Sinuses/Orbits: No acute finding. Other: None. IMPRESSION: Stable age-appropriate cerebral atrophy. No acute intracranial process. Electronically Signed   By: Randa Ngo  M.D.   On: 11/30/2019 21:16   CT Cervical Spine Wo Contrast  Result Date: 11/30/2019 CLINICAL DATA:  [place], neck pain EXAM: CT CERVICAL SPINE WITHOUT CONTRAST TECHNIQUE: Multidetector CT imaging of the cervical spine was performed without intravenous contrast. Multiplanar CT image reconstructions were also generated. COMPARISON:  None. FINDINGS: Alignment: Mild retrolisthesis of C3 relative to C4 due to prominent spondylosis. Otherwise alignment is anatomic. Skull base and vertebrae: No acute displaced fracture. Soft tissues and spinal canal: No prevertebral fluid or swelling. No visible canal hematoma. Disc levels: There is multilevel cervical spondylosis from C3 through C6. Mild symmetrical neural foraminal encroachment at C3/C4 and C4/C5. Upper chest: Airway is patent. Lung apices are clear. Other: Reconstructed images demonstrate no additional findings. IMPRESSION: 1. No acute cervical spine fracture. 2. Multilevel cervical spondylosis. Electronically Signed   By: Randa Ngo M.D.   On: 11/30/2019 21:17   DG Hip Unilat W or Wo Pelvis 2-3 Views Right  Result Date: 11/30/2019 CLINICAL DATA:  Pain status post fall EXAM: DG HIP (WITH OR WITHOUT PELVIS) 2-3V RIGHT COMPARISON:  None. FINDINGS: There is no acute displaced fracture. There is no dislocation.  There are mild degenerative changes of the right hip. Phleboliths project over the patient's pelvis. IMPRESSION: Negative. Electronically Signed   By: Constance Holster M.D.   On: 11/30/2019 21:27     Procedures Procedures (including critical care time)  Medications Ordered in ED Medications - No data to display  ED Course  I have reviewed the triage vital signs and the nursing notes.  Pertinent labs & imaging results that were available during my care of the patient were reviewed by me and considered in my medical decision making (see chart for details).    MDM Rules/Calculators/A&P                      2045 patient is ambulated in the department without assistance or a walker.  Nursing tech noted to me that he ambulated with a steady gait but had an occasional limp of his right hip area without complaints of pain.  Pt of SNF facility with reported mechanical fall.  XR's neg for acute bony injury.  CT head reassuring.  NV intact and he denies pain during ER stay.    Pt also seen by Dr. Roderic Palau.  He appears appropriate for d/c back to facility.  Legal guardian notified   Final Clinical Impression(s) / ED Diagnoses Final diagnoses:  Fall, initial encounter    Rx / DC Orders ED Discharge Orders    None       Bufford Lope 12/02/19 2304    Milton Ferguson, MD 12/03/19 1458

## 2019-11-30 NOTE — ED Notes (Signed)
Pt ambulated. Gate steady. Pt not picking up rt foot completely and slight stumble when occurerd, pt stated not normal

## 2019-11-30 NOTE — ED Triage Notes (Signed)
[facility] - pt from [facility], fell hurting right flank area.

## 2019-11-30 NOTE — Discharge Instructions (Addendum)
[name]'s x-rays and CTs this evening were negative for broken bones.  His blood work and urine test were also reassuring.  Follow-up with his primary doctor this week for recheck.

## 2019-12-01 DIAGNOSIS — R69 Illness, unspecified: Secondary | ICD-10-CM | POA: Diagnosis not present

## 2019-12-01 DIAGNOSIS — R5381 Other malaise: Secondary | ICD-10-CM | POA: Diagnosis not present

## 2019-12-03 DIAGNOSIS — I480 Paroxysmal atrial fibrillation: Secondary | ICD-10-CM | POA: Diagnosis not present

## 2019-12-03 DIAGNOSIS — I951 Orthostatic hypotension: Secondary | ICD-10-CM | POA: Diagnosis not present

## 2019-12-03 DIAGNOSIS — G301 Alzheimer's disease with late onset: Secondary | ICD-10-CM | POA: Diagnosis not present

## 2019-12-03 DIAGNOSIS — R296 Repeated falls: Secondary | ICD-10-CM | POA: Diagnosis not present

## 2019-12-04 ENCOUNTER — Other Ambulatory Visit: Payer: Self-pay

## 2019-12-04 ENCOUNTER — Emergency Department (HOSPITAL_COMMUNITY)
Admission: EM | Admit: 2019-12-04 | Discharge: 2019-12-05 | Disposition: A | Discharge status: Home / Self Care | Payer: Medicare HMO | Attending: Emergency Medicine | Admitting: Emergency Medicine

## 2019-12-04 ENCOUNTER — Encounter (HOSPITAL_COMMUNITY): Payer: Self-pay | Admitting: Emergency Medicine

## 2019-12-04 DIAGNOSIS — Z79899 Other long term (current) drug therapy: Secondary | ICD-10-CM | POA: Insufficient documentation

## 2019-12-04 DIAGNOSIS — R040 Epistaxis: Secondary | ICD-10-CM | POA: Insufficient documentation

## 2019-12-04 DIAGNOSIS — R58 Hemorrhage, not elsewhere classified: Secondary | ICD-10-CM | POA: Diagnosis not present

## 2019-12-04 DIAGNOSIS — F0390 Unspecified dementia without behavioral disturbance: Secondary | ICD-10-CM | POA: Diagnosis not present

## 2019-12-04 DIAGNOSIS — Z87891 Personal history of nicotine dependence: Secondary | ICD-10-CM | POA: Diagnosis not present

## 2019-12-04 DIAGNOSIS — Z8551 Personal history of malignant neoplasm of bladder: Secondary | ICD-10-CM | POA: Insufficient documentation

## 2019-12-04 DIAGNOSIS — Z8673 Personal history of transient ischemic attack (TIA), and cerebral infarction without residual deficits: Secondary | ICD-10-CM | POA: Insufficient documentation

## 2019-12-04 DIAGNOSIS — Z7901 Long term (current) use of anticoagulants: Secondary | ICD-10-CM | POA: Insufficient documentation

## 2019-12-04 DIAGNOSIS — I2510 Atherosclerotic heart disease of native coronary artery without angina pectoris: Secondary | ICD-10-CM | POA: Diagnosis not present

## 2019-12-04 LAB — CBC WITH DIFFERENTIAL/PLATELET
Abs Immature Granulocytes: 0.01 10*3/uL (ref 0.00–0.07)
Basophils Absolute: 0 10*3/uL (ref 0.0–0.1)
Basophils Relative: 1 %
Eosinophils Absolute: 0.2 10*3/uL (ref 0.0–0.5)
Eosinophils Relative: 3 %
HCT: 39.3 % (ref 39.0–52.0)
Hemoglobin: 13 g/dL (ref 13.0–17.0)
Immature Granulocytes: 0 %
Lymphocytes Relative: 22 %
Lymphs Abs: 1.3 10*3/uL (ref 0.7–4.0)
MCH: 32.8 pg (ref 26.0–34.0)
MCHC: 33.1 g/dL (ref 30.0–36.0)
MCV: 99.2 fL (ref 80.0–100.0)
Monocytes Absolute: 0.5 10*3/uL (ref 0.1–1.0)
Monocytes Relative: 9 %
Neutro Abs: 3.9 10*3/uL (ref 1.7–7.7)
Neutrophils Relative %: 65 %
Platelets: 151 10*3/uL (ref 150–400)
RBC: 3.96 MIL/uL — ABNORMAL LOW (ref 4.22–5.81)
RDW: 12.3 % (ref 11.5–15.5)
WBC: 5.9 10*3/uL (ref 4.0–10.5)
nRBC: 0 % (ref 0.0–0.2)

## 2019-12-04 LAB — PROTIME-INR
INR: 2.3 — ABNORMAL HIGH (ref 0.8–1.2)
Prothrombin Time: 24.9 seconds — ABNORMAL HIGH (ref 11.4–15.2)

## 2019-12-04 MED ORDER — LIDOCAINE-EPINEPHRINE (PF) 1 %-1:200000 IJ SOLN: 20.0000 mL | Freq: Once | INTRAMUSCULAR | Status: DC

## 2019-12-04 MED ORDER — OXYMETAZOLINE HCL 0.05 % NA SOLN: 1.0000 | Freq: Once | NASAL | Status: DC

## 2019-12-04 MED ORDER — TRANEXAMIC ACID FOR EPISTAXIS: 500.0000 mg | Freq: Once | TOPICAL | Status: DC

## 2019-12-04 NOTE — ED Notes (Signed)
Pt ambulated to restroom with walker. Pt given ice water.

## 2019-12-04 NOTE — ED Provider Notes (Signed)
[facility] EMERGENCY DEPARTMENT Provider Note   CSN: [id number] Arrival date & time: 12/04/19  2140     History Chief Complaint  Patient presents with  . Epistaxis    [name] is a 86 y.o. male.  Level 5 caveat for dementia.  Patient comes facility with nosebleed onset this evening while lying in bed.  No trauma.  Does have a history of atrial fibrillation on Xarelto, dementia, CAD, bladder cancer and previous stroke.  Bleeding was apparently coming from his right nare.  No documented fall or trauma.  Bleeding has subsided on arrival.  He does take Xarelto.  No chest pain or shortness of breath.  No dizziness or lightheadedness.  No focal weakness, numbness or tingling.  Denies any headache, abdominal pain, nausea or vomiting.  D/w [employer] staff. The previous shift has already gone home and the current shift is unfamiliar with what happened with the patient. They state he had a nosebleed without trauma.  The history is provided by the patient and the EMS personnel.  Epistaxis      Past Medical History:  Diagnosis Date  . [name] deficiency 2011  . Bladder cancer ([place])   . CAD (coronary artery disease)   . Dementia ([place])   . Hx of colonic polyp   . Hyperlipidemia   . Memory loss 2011    Patient Active Problem List   Diagnosis Date Noted  . Hygroma 09/10/2018  . 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 Chest pain 12/29/2013  . Atrial fibrillation ([place]) 12/29/2013  . VERTIGO 02/04/2010  . [name] DEFICIENCY 01/31/2010  . Memory loss 01/27/2010  . PARESTHESIA 01/27/2010  . Hyperlipidemia 11/02/2009  . CORONARY ARTERY DISEASE 11/02/2009  . SINUSITIS, ACUTE 11/02/2009  . EPISTAXIS 11/02/2009  . COLONIC POLYPS, HX OF 11/02/2009    Past Surgical History:  Procedure Laterality Date  . CHOLECYSTECTOMY         Family History  Problem Relation Age of Onset  . Coronary artery disease Other   . Dementia Other     Social  History   Tobacco Use  . Smoking status: Former Smoker    Quit date: 12/29/1973    Years since quitting: 45.9  . Smokeless tobacco: Never Used  Substance Use Topics  . Alcohol use: No  . Drug use: Never    Home Medications Prior to Admission medications   Medication Sig Start Date End Date Taking? Authorizing Provider  acetaminophen (TYLENOL) 500 MG tablet Take 500 mg by mouth every 6 (six) hours as needed for mild pain, fever or headache.    [provider]  atorvastatin (LIPITOR) 20 MG tablet Take 1 tablet (20 mg total) by mouth daily at 6 PM. 09/14/18   Rinehuls, Early Chars, PA-C  doxycycline (VIBRAMYCIN) 100 MG capsule Take 1 capsule (100 mg total) by mouth 2 (two) times daily. One po bid x 7 days Patient not taking: Reported on 11/30/2019 10/04/19   Deno Etienne, DO  midodrine (PROAMATINE) 5 MG tablet Take 5 mg by mouth 2 (two) times daily with a meal.    [provider]  rivaroxaban (XARELTO) 20 MG TABS tablet Take 20 mg by mouth daily with supper.    [provider]    Allergies    Patient has no known allergies.  Review of Systems   Review of Systems  Unable to perform ROS: Dementia  HENT: Positive for nosebleeds.     Physical Exam Updated Vital  Signs BP 136/76 (BP Location: Left Arm)   Pulse 79   Temp 97.7 F (36.5 C) (Oral)   Resp 18   Wt 74 kg   SpO2 100%   BMI 22.75 kg/m   Physical Exam Vitals and nursing note reviewed.  Constitutional:      General: He is not in acute distress.    Appearance: He is well-developed.  HENT:     Head: Normocephalic and atraumatic.     Nose:     Comments: Dried blood in right nare.  No active bleeding.  No blood in posterior pharynx    Mouth/Throat:     Pharynx: No oropharyngeal exudate.  Eyes:     Conjunctiva/sclera: Conjunctivae normal.     Pupils: Pupils are equal, round, and reactive to light.  Neck:     Comments: No meningismus. Cardiovascular:     Rate and Rhythm: Normal rate. Rhythm  irregular.     Heart sounds: Normal heart sounds. No murmur.  Pulmonary:     Effort: Pulmonary effort is normal. No respiratory distress.     Breath sounds: Normal breath sounds.  Abdominal:     Palpations: Abdomen is soft.     Tenderness: There is no abdominal tenderness. There is no guarding or rebound.  Musculoskeletal:        General: No tenderness. Normal range of motion.     Cervical back: Normal range of motion and neck supple.  Skin:    General: Skin is warm.     Capillary Refill: Capillary refill takes less than 2 seconds.  Neurological:     Mental Status: He is alert.     Cranial Nerves: No cranial nerve deficit.     Motor: No abnormal muscle tone.     Coordination: Coordination normal.     Comments: Oriented to person and place.  Follows commands, 5/5 strength throughout. no facial droop.Marland Kitchen   Psychiatric:        Behavior: Behavior normal.     ED Results / Procedures / Treatments   Labs (all labs ordered are listed, but only abnormal results are displayed) Labs Reviewed  CBC WITH DIFFERENTIAL/PLATELET - Abnormal; Notable for the following components:      Result Value   RBC 3.96 (*)    All other components within normal limits  PROTIME-INR - Abnormal; Notable for the following components:   Prothrombin Time 24.9 (*)    INR 2.3 (*)    All other components within normal limits  BASIC METABOLIC PANEL - Abnormal; Notable for the following components:   Glucose, Bld 101 (*)    BUN 26 (*)    Creatinine, Ser 1.41 (*)    GFR calc non Af Amer 44 (*)    GFR calc Af Amer 52 (*)    All other components within normal limits    EKG None  Radiology No results found.  Procedures .Epistaxis Management  Date/Time: 12/05/2019 12:44 AM Performed by: Ezequiel Essex, MD Authorized by: Ezequiel Essex, MD   Consent:    Consent obtained:  Verbal   Consent given by:  Patient and guardian   Risks discussed:  Bleeding, infection, nasal injury and pain Anesthesia (see  MAR for exact dosages):    Anesthesia method:  None Procedure details:    Treatment site:  R anterior   Repair method: oxymetazoline.   Treatment complexity:  Limited   Treatment episode: recurring   Post-procedure details:    Assessment:  Bleeding stopped   Patient tolerance of procedure:  Tolerated well, no immediate complications   (including critical care time)  Medications Ordered in ED Medications  oxymetazoline (AFRIN) 0.05 % nasal spray 1 spray (has no administration in time range)    ED Course  I have reviewed the triage vital signs and the nursing notes.  Pertinent labs & imaging results that were available during my care of the patient were reviewed by me and considered in my medical decision making (see chart for details).    MDM Rules/Calculators/A&P                      Sent from facility with nosebleed which has since resolved on arrival.   Vitals remained stable.  No dizziness or lightheadedness.  No chest pain or shortness of breath.  Hemoglobin is stable at 13.  Patient observed in the ED without any further nosebleeds.  He is given prophylactic Afrin. INR 2.3  Discussed with his guardian as well as his facility. Follow-up with PCP as well as ENT.  Hold pressure for 30 minutes if bleeding returns. If bleeding persists after this, return to the ED for reassessment. Final Clinical Impression(s) / ED Diagnoses Final diagnoses:  Epistaxis    Rx / DC Orders ED Discharge Orders    None       Jahkari Maclin, Annie Main, MD 12/05/19 0104

## 2019-12-04 NOTE — ED Triage Notes (Signed)
Pt was lying in bed tonight and his nose started bleeding. Pt is on xarelto.

## 2019-12-05 LAB — BASIC METABOLIC PANEL
Anion gap: 10 (ref 5–15)
BUN: 26 mg/dL — ABNORMAL HIGH (ref 8–23)
CO2: 26 mmol/L (ref 22–32)
Calcium: 9.2 mg/dL (ref 8.9–10.3)
Chloride: 101 mmol/L (ref 98–111)
Creatinine, Ser: 1.41 mg/dL — ABNORMAL HIGH (ref 0.61–1.24)
GFR calc Af Amer: 52 mL/min — ABNORMAL LOW (ref >60)
GFR calc non Af Amer: 44 mL/min — ABNORMAL LOW (ref >60)
Glucose, Bld: 101 mg/dL — ABNORMAL HIGH (ref 70–99)
Potassium: 4.1 mmol/L (ref 3.5–5.1)
Sodium: 137 mmol/L (ref 135–145)

## 2019-12-05 NOTE — Discharge Instructions (Addendum)
Hold pressure for 30 minutes if bleeding recurs.  Follow-up with the ENT doctor and PCP.  Return to the ED if bleeding persists after 30 minutes or any other concerns.

## 2019-12-15 DIAGNOSIS — R040 Epistaxis: Secondary | ICD-10-CM | POA: Diagnosis not present

## 2019-12-15 DIAGNOSIS — G301 Alzheimer's disease with late onset: Secondary | ICD-10-CM | POA: Diagnosis not present

## 2019-12-15 DIAGNOSIS — I480 Paroxysmal atrial fibrillation: Secondary | ICD-10-CM | POA: Diagnosis not present

## 2019-12-19 DIAGNOSIS — N182 Chronic kidney disease, stage 2 (mild): Secondary | ICD-10-CM | POA: Diagnosis not present

## 2019-12-19 DIAGNOSIS — Z0001 Encounter for general adult medical examination with abnormal findings: Secondary | ICD-10-CM | POA: Diagnosis not present

## 2019-12-22 ENCOUNTER — Emergency Department (HOSPITAL_COMMUNITY)
Admission: EM | Admit: 2019-12-22 | Discharge: 2019-12-23 | Disposition: A | Discharge status: Home / Self Care | Payer: Medicare HMO | Attending: Emergency Medicine | Admitting: Emergency Medicine

## 2019-12-22 ENCOUNTER — Emergency Department (HOSPITAL_COMMUNITY): Payer: Medicare HMO

## 2019-12-22 ENCOUNTER — Encounter (HOSPITAL_COMMUNITY): Payer: Self-pay | Admitting: *Deleted

## 2019-12-22 ENCOUNTER — Other Ambulatory Visit: Payer: Self-pay

## 2019-12-22 DIAGNOSIS — R1030 Lower abdominal pain, unspecified: Secondary | ICD-10-CM

## 2019-12-22 DIAGNOSIS — R1011 Right upper quadrant pain: Secondary | ICD-10-CM | POA: Diagnosis not present

## 2019-12-22 DIAGNOSIS — Z7901 Long term (current) use of anticoagulants: Secondary | ICD-10-CM | POA: Insufficient documentation

## 2019-12-22 DIAGNOSIS — I4891 Unspecified atrial fibrillation: Secondary | ICD-10-CM | POA: Diagnosis not present

## 2019-12-22 DIAGNOSIS — I700 Atherosclerosis of aorta: Secondary | ICD-10-CM | POA: Insufficient documentation

## 2019-12-22 DIAGNOSIS — I2510 Atherosclerotic heart disease of native coronary artery without angina pectoris: Secondary | ICD-10-CM | POA: Diagnosis not present

## 2019-12-22 DIAGNOSIS — R1031 Right lower quadrant pain: Secondary | ICD-10-CM | POA: Diagnosis not present

## 2019-12-22 DIAGNOSIS — K5900 Constipation, unspecified: Secondary | ICD-10-CM | POA: Diagnosis not present

## 2019-12-22 DIAGNOSIS — F0390 Unspecified dementia without behavioral disturbance: Secondary | ICD-10-CM | POA: Insufficient documentation

## 2019-12-22 DIAGNOSIS — R109 Unspecified abdominal pain: Secondary | ICD-10-CM | POA: Diagnosis not present

## 2019-12-22 NOTE — ED Provider Notes (Signed)
[facility] EMERGENCY DEPARTMENT Provider Note   CSN: [id number] Arrival date & time: 12/22/19  1905     History Chief Complaint  Patient presents with  . Abdominal Pain    [name] is a 86 y.o. male.  Patient complains of periodic right lower quadrant and right upper quadrant pain.  He was hurting earlier today but has no pain now  The history is provided by the patient. No language interpreter was used.  Abdominal Pain Pain location:  RUQ and RLQ Pain quality: aching   Pain radiates to:  Does not radiate Pain severity:  Moderate Onset quality:  Sudden Timing:  Constant Chronicity:  Chronic Context: not alcohol use   Relieved by:  Nothing Worsened by:  Nothing Associated symptoms: no chest pain, no cough, no diarrhea, no fatigue and no hematuria        Past Medical History:  Diagnosis Date  . [name] deficiency 2011  . Bladder cancer ([place])   . CAD (coronary artery disease)   . Dementia ([place])   . Hx of colonic polyp   . Hyperlipidemia   . Memory loss 2011    Patient Active Problem List   Diagnosis Date Noted  . Hygroma 09/10/2018  . 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 Chest pain 12/29/2013  . Atrial fibrillation ([place]) 12/29/2013  . VERTIGO 02/04/2010  . [name] DEFICIENCY 01/31/2010  . Memory loss 01/27/2010  . PARESTHESIA 01/27/2010  . Hyperlipidemia 11/02/2009  . CORONARY ARTERY DISEASE 11/02/2009  . SINUSITIS, ACUTE 11/02/2009  . EPISTAXIS 11/02/2009  . COLONIC POLYPS, HX OF 11/02/2009    Past Surgical History:  Procedure Laterality Date  . CHOLECYSTECTOMY         Family History  Problem Relation Age of Onset  . Coronary artery disease Other   . Dementia Other     Social History   Tobacco Use  . Smoking status: Former Smoker    Quit date: 12/29/1973    Years since quitting: 46.0  . Smokeless tobacco: Never Used  Vaping Use  . Vaping Use: Never used  Substance Use Topics  . Alcohol use: No    . Drug use: Never    Home Medications Prior to Admission medications   Medication Sig Start Date End Date Taking? Authorizing Provider  acetaminophen (TYLENOL) 500 MG tablet Take 500 mg by mouth every 6 (six) hours as needed for mild pain, fever or headache.    [provider]  atorvastatin (LIPITOR) 20 MG tablet Take 1 tablet (20 mg total) by mouth daily at 6 PM. 09/14/18   Rinehuls, Early Chars, PA-C  doxycycline (VIBRAMYCIN) 100 MG capsule Take 1 capsule (100 mg total) by mouth 2 (two) times daily. One po bid x 7 days Patient not taking: Reported on 11/30/2019 10/04/19   Deno Etienne, DO  midodrine (PROAMATINE) 5 MG tablet Take 5 mg by mouth 2 (two) times daily with a meal.    [provider]  rivaroxaban (XARELTO) 20 MG TABS tablet Take 20 mg by mouth daily with supper.    [provider]    Allergies    Patient has no known allergies.  Review of Systems   Review of Systems  Constitutional: Negative for appetite change and fatigue.  HENT: Negative for congestion, ear discharge and sinus pressure.   Eyes: Negative for discharge.  Respiratory: Negative for cough.   Cardiovascular: Negative for chest pain.  Gastrointestinal: Positive for abdominal pain. Negative for diarrhea.  Genitourinary: Negative for frequency and hematuria.  Musculoskeletal: Negative for back pain.  Skin: Negative for rash.  Neurological: Negative for seizures and headaches.  Psychiatric/Behavioral: Negative for hallucinations.    Physical Exam Updated Vital Signs BP 111/70 (BP Location: Left Arm)   Pulse 77   Temp 98.9 F (37.2 C) (Oral)   Resp 18   Ht 6\' 2"  (1.88 m)   Wt 89.8 kg   SpO2 98%   BMI 25.42 kg/m   Physical Exam Nursing note reviewed.  Constitutional:      Appearance: He is well-developed.  HENT:     Head: Normocephalic.     Nose: Nose normal.  Eyes:     General: No scleral icterus.    Conjunctiva/sclera: Conjunctivae normal.  Neck:     Thyroid: No  thyromegaly.  Cardiovascular:     Rate and Rhythm: Normal rate and regular rhythm.     Heart sounds: No murmur heard.  No friction rub. No gallop.   Pulmonary:     Breath sounds: No stridor. No wheezing or rales.  Chest:     Chest wall: No tenderness.  Abdominal:     General: There is no distension.     Tenderness: There is abdominal tenderness. There is no rebound.  Musculoskeletal:        General: Normal range of motion.     Cervical back: Neck supple.  Lymphadenopathy:     Cervical: No cervical adenopathy.  Skin:    Findings: No erythema or rash.  Neurological:     Mental Status: He is oriented to person, place, and time.     Motor: No abnormal muscle tone.     Coordination: Coordination normal.  Psychiatric:        Behavior: Behavior normal.     ED Results / Procedures / Treatments   Labs (all labs ordered are listed, but only abnormal results are displayed) Labs Reviewed  CBC WITH DIFFERENTIAL/PLATELET  COMPREHENSIVE METABOLIC PANEL  LIPASE, BLOOD  URINALYSIS, ROUTINE W REFLEX MICROSCOPIC    EKG None  Radiology No results found.  Procedures Procedures (including critical care time)  Medications Ordered in ED Medications - No data to display  ED Course  I have reviewed the triage vital signs and the nursing notes.  Pertinent labs & imaging results that were available during my care of the patient were reviewed by me and considered in my medical decision making (see chart for details).    MDM Rules/Calculators/A&P                          Patient with right upper and lower quadrant abdominal pain that comes and goes.  Labs and x-rays pending Final Clinical Impression(s) / ED Diagnoses Final diagnoses:  None    Rx / DC Orders ED Discharge Orders    None       Milton Ferguson, MD 12/24/19 1148

## 2019-12-22 NOTE — ED Triage Notes (Signed)
Pt c/o right lower abdominal pain for 6 months; pt denies any n/v/d or urinary sx; pt states the pain is better when he pushes on it

## 2019-12-23 DIAGNOSIS — R109 Unspecified abdominal pain: Secondary | ICD-10-CM | POA: Diagnosis not present

## 2019-12-23 DIAGNOSIS — K5900 Constipation, unspecified: Secondary | ICD-10-CM | POA: Diagnosis not present

## 2019-12-23 LAB — COMPREHENSIVE METABOLIC PANEL
ALT: 8 U/L (ref 0–44)
AST: 15 U/L (ref 15–41)
Albumin: 4.3 g/dL (ref 3.5–5.0)
Alkaline Phosphatase: 79 U/L (ref 38–126)
Anion gap: 10 (ref 5–15)
BUN: 27 mg/dL — ABNORMAL HIGH (ref 8–23)
CO2: 28 mmol/L (ref 22–32)
Calcium: 9.3 mg/dL (ref 8.9–10.3)
Chloride: 100 mmol/L (ref 98–111)
Creatinine, Ser: 1.27 mg/dL — ABNORMAL HIGH (ref 0.61–1.24)
GFR calc Af Amer: 58 mL/min — ABNORMAL LOW (ref >60)
GFR calc non Af Amer: 50 mL/min — ABNORMAL LOW (ref >60)
Glucose, Bld: 104 mg/dL — ABNORMAL HIGH (ref 70–99)
Potassium: 4 mmol/L (ref 3.5–5.1)
Sodium: 138 mmol/L (ref 135–145)
Total Bilirubin: 0.8 mg/dL (ref 0.3–1.2)
Total Protein: 7.1 g/dL (ref 6.5–8.1)

## 2019-12-23 LAB — CBC WITH DIFFERENTIAL/PLATELET
Abs Immature Granulocytes: 0.01 10*3/uL (ref 0.00–0.07)
Basophils Absolute: 0 10*3/uL (ref 0.0–0.1)
Basophils Relative: 0 %
Eosinophils Absolute: 0.1 10*3/uL (ref 0.0–0.5)
Eosinophils Relative: 2 %
HCT: 39.4 % (ref 39.0–52.0)
Hemoglobin: 13.1 g/dL (ref 13.0–17.0)
Immature Granulocytes: 0 %
Lymphocytes Relative: 20 %
Lymphs Abs: 1.4 10*3/uL (ref 0.7–4.0)
MCH: 32.9 pg (ref 26.0–34.0)
MCHC: 33.2 g/dL (ref 30.0–36.0)
MCV: 99 fL (ref 80.0–100.0)
Monocytes Absolute: 0.6 10*3/uL (ref 0.1–1.0)
Monocytes Relative: 9 %
Neutro Abs: 4.6 10*3/uL (ref 1.7–7.7)
Neutrophils Relative %: 69 %
Platelets: 137 10*3/uL — ABNORMAL LOW (ref 150–400)
RBC: 3.98 MIL/uL — ABNORMAL LOW (ref 4.22–5.81)
RDW: 13 % (ref 11.5–15.5)
WBC: 6.7 10*3/uL (ref 4.0–10.5)
nRBC: 0 % (ref 0.0–0.2)

## 2019-12-23 LAB — LIPASE, BLOOD: Lipase: 28 U/L (ref 11–51)

## 2019-12-23 MED ORDER — IOHEXOL 300 MG/ML  SOLN
100.0000 mL | Freq: Once | INTRAMUSCULAR | Status: AC | PRN
  Administered 2019-12-23: 100 mL via INTRAVENOUS

## 2019-12-23 MED ORDER — POLYETHYLENE GLYCOL 3350 17 GM/SCOOP PO POWD: 1.0000 | Freq: Once | ORAL | 0 refills | Status: AC

## 2019-12-23 NOTE — Discharge Instructions (Addendum)
Get miralax and put one dose or 17 g in 8 ounces of water,  take 1 dose every 30 minutes for 2-3 hours or until you  get good results and then once or twice daily to prevent constipation.  ° °

## 2019-12-23 NOTE — ED Provider Notes (Signed)
Patient was left at change of shift to get results of his laboratory tests and CT scan.  Patient states he had lower abdominal pain.  He states he did have a bowel movement today but it was loose.  Patient was sent back to his facility with instructions on how to manage his constipation.  Results for orders placed or performed during the hospital encounter of 12/22/19  CBC with Differential/Platelet  Result Value Ref Range   WBC 6.7 4.0 - 10.5 K/uL   RBC 3.98 (L) 4.22 - 5.81 MIL/uL   Hemoglobin 13.1 13.0 - 17.0 g/dL   HCT 39.4 39 - 52 %   MCV 99.0 80.0 - 100.0 fL   MCH 32.9 26.0 - 34.0 pg   MCHC 33.2 30.0 - 36.0 g/dL   RDW 13.0 11.5 - 15.5 %   Platelets 137 (L) 150 - 400 K/uL   nRBC 0.0 0.0 - 0.2 %   Neutrophils Relative % 69 %   Neutro Abs 4.6 1.7 - 7.7 K/uL   Lymphocytes Relative 20 %   Lymphs Abs 1.4 0.7 - 4.0 K/uL   Monocytes Relative 9 %   Monocytes Absolute 0.6 0 - 1 K/uL   Eosinophils Relative 2 %   Eosinophils Absolute 0.1 0 - 0 K/uL   Basophils Relative 0 %   Basophils Absolute 0.0 0 - 0 K/uL   Immature Granulocytes 0 %   Abs Immature Granulocytes 0.01 0.00 - 0.07 K/uL  Comprehensive metabolic panel  Result Value Ref Range   Sodium 138 135 - 145 mmol/L   Potassium 4.0 3.5 - 5.1 mmol/L   Chloride 100 98 - 111 mmol/L   CO2 28 22 - 32 mmol/L   Glucose, Bld 104 (H) 70 - 99 mg/dL   BUN 27 (H) 8 - 23 mg/dL   Creatinine, Ser 1.27 (H) 0.61 - 1.24 mg/dL   Calcium 9.3 8.9 - 10.3 mg/dL   Total Protein 7.1 6.5 - 8.1 g/dL   Albumin 4.3 3.5 - 5.0 g/dL   AST 15 15 - 41 U/L   ALT 8 0 - 44 U/L   Alkaline Phosphatase 79 38 - 126 U/L   Total Bilirubin 0.8 0.3 - 1.2 mg/dL   GFR calc non Af Amer 50 (L) >60 mL/min   GFR calc Af Amer 58 (L) >60 mL/min   Anion gap 10 5 - 15  Lipase, blood  Result Value Ref Range   Lipase 28 11 - 51 U/L   Laboratory interpretation all normal except stable renal insufficiency   CT ABDOMEN PELVIS W CONTRAST  Result Date: 12/23/2019 CLINICAL  DATA:  Abdominal pain for 6 months EXAM: CT ABDOMEN AND PELVIS WITH CONTRAST TECHNIQUE: Multidetector CT imaging of the abdomen and pelvis was performed using the standard protocol following bolus administration of intravenous contrast. CONTRAST:  120mL OMNIPAQUE IOHEXOL 300 MG/ML  SOLN COMPARISON:  October 03, 2019 FINDINGS: Lower chest: There is mild cardiomegaly. No hiatal hernia. The visualized portions of the lungs are clear. Hepatobiliary: Diffuse low density seen throughout the liver parenchyma. Again noted is a 1.6 cm low-density lesion in the posterior right liver lobe. Main portal vein is patent. The patient is status post cholecystectomy. Pancreas: Unremarkable. No pancreatic ductal dilatation or surrounding inflammatory changes. Spleen: Normal in size without focal abnormality. Adrenals/Urinary Tract: Both adrenal glands appear normal. The kidneys and collecting system appear normal without evidence of urinary tract calculus or hydronephrosis. The bladder is partially distended with apparent wall thickening. Stomach/Bowel: The stomach  and small bowel are normal in appearance. There is a large amount of sigmoid rectal stool with a fecal ball. There is also some fecalization seen of distal ileal loops. No focal wall thickening however is noted. Vascular/Lymphatic: There are no enlarged mesenteric, retroperitoneal, or pelvic lymph nodes. Scattered aortic atherosclerotic calcifications are seen without aneurysmal dilatation. Reproductive: Heterogeneously enlarged prostate gland is seen protruding into the posterior bladder. Other: No evidence of abdominal wall mass or hernia. Musculoskeletal: No acute or significant osseous findings. IMPRESSION: Large amount of sigmoid rectal stool with a fecal ball. There is also fecalization of distal ileal loops which could be due to constipation/slow transit. Prostatomegaly. Aortic Atherosclerosis (ICD10-I70.0). Hepatic steatosis Electronically Signed   By: Prudencio Pair  M.D.   On: 12/23/2019 01:14        Rolland Porter, MD 12/23/19 [phone]

## 2019-12-26 DIAGNOSIS — G301 Alzheimer's disease with late onset: Secondary | ICD-10-CM | POA: Diagnosis not present

## 2019-12-26 DIAGNOSIS — R600 Localized edema: Secondary | ICD-10-CM | POA: Diagnosis not present

## 2019-12-26 DIAGNOSIS — Z7689 Persons encountering health services in other specified circumstances: Secondary | ICD-10-CM | POA: Diagnosis not present

## 2019-12-30 DIAGNOSIS — Z7901 Long term (current) use of anticoagulants: Secondary | ICD-10-CM | POA: Diagnosis not present

## 2019-12-30 DIAGNOSIS — R040 Epistaxis: Secondary | ICD-10-CM | POA: Diagnosis not present

## 2019-12-30 DIAGNOSIS — Z7689 Persons encountering health services in other specified circumstances: Secondary | ICD-10-CM | POA: Diagnosis not present

## 2020-01-22 ENCOUNTER — Encounter (HOSPITAL_COMMUNITY): Payer: Self-pay | Admitting: Emergency Medicine

## 2020-01-22 ENCOUNTER — Emergency Department (HOSPITAL_COMMUNITY)
Admission: EM | Admit: 2020-01-22 | Discharge: 2020-01-23 | Disposition: A | Discharge status: Home / Self Care | Payer: Medicare HMO | Attending: Emergency Medicine | Admitting: Emergency Medicine

## 2020-01-22 ENCOUNTER — Other Ambulatory Visit: Payer: Self-pay

## 2020-01-22 DIAGNOSIS — H353131 Nonexudative age-related macular degeneration, bilateral, early dry stage: Secondary | ICD-10-CM | POA: Diagnosis not present

## 2020-01-22 DIAGNOSIS — R410 Disorientation, unspecified: Secondary | ICD-10-CM | POA: Diagnosis not present

## 2020-01-22 DIAGNOSIS — Z87891 Personal history of nicotine dependence: Secondary | ICD-10-CM | POA: Insufficient documentation

## 2020-01-22 DIAGNOSIS — R55 Syncope and collapse: Secondary | ICD-10-CM | POA: Diagnosis not present

## 2020-01-22 DIAGNOSIS — I2510 Atherosclerotic heart disease of native coronary artery without angina pectoris: Secondary | ICD-10-CM | POA: Insufficient documentation

## 2020-01-22 DIAGNOSIS — Z7901 Long term (current) use of anticoagulants: Secondary | ICD-10-CM | POA: Diagnosis not present

## 2020-01-22 DIAGNOSIS — R5381 Other malaise: Secondary | ICD-10-CM | POA: Diagnosis not present

## 2020-01-22 DIAGNOSIS — R531 Weakness: Secondary | ICD-10-CM | POA: Insufficient documentation

## 2020-01-22 DIAGNOSIS — Z79899 Other long term (current) drug therapy: Secondary | ICD-10-CM | POA: Diagnosis not present

## 2020-01-22 LAB — BASIC METABOLIC PANEL
Anion gap: 8 (ref 5–15)
BUN: 26 mg/dL — ABNORMAL HIGH (ref 8–23)
CO2: 28 mmol/L (ref 22–32)
Calcium: 9.2 mg/dL (ref 8.9–10.3)
Chloride: 103 mmol/L (ref 98–111)
Creatinine, Ser: 1.17 mg/dL (ref 0.61–1.24)
GFR calc Af Amer: >60 mL/min (ref >60)
GFR calc non Af Amer: 56 mL/min — ABNORMAL LOW (ref >60)
Glucose, Bld: 170 mg/dL — ABNORMAL HIGH (ref 70–99)
Potassium: 4.1 mmol/L (ref 3.5–5.1)
Sodium: 139 mmol/L (ref 135–145)

## 2020-01-22 LAB — URINALYSIS, ROUTINE W REFLEX MICROSCOPIC
Bilirubin Urine: NEGATIVE
Glucose, UA: NEGATIVE mg/dL
Hgb urine dipstick: NEGATIVE
Ketones, ur: NEGATIVE mg/dL
Leukocytes,Ua: NEGATIVE
Nitrite: NEGATIVE
Protein, ur: NEGATIVE mg/dL
Specific Gravity, Urine: 1.01 (ref 1.005–1.030)
pH: 7 (ref 5.0–8.0)

## 2020-01-22 LAB — CBC
HCT: 36.9 % — ABNORMAL LOW (ref 39.0–52.0)
Hemoglobin: 12.2 g/dL — ABNORMAL LOW (ref 13.0–17.0)
MCH: 32.9 pg (ref 26.0–34.0)
MCHC: 33.1 g/dL (ref 30.0–36.0)
MCV: 99.5 fL (ref 80.0–100.0)
Platelets: 154 10*3/uL (ref 150–400)
RBC: 3.71 MIL/uL — ABNORMAL LOW (ref 4.22–5.81)
RDW: 13.1 % (ref 11.5–15.5)
WBC: 5.3 10*3/uL (ref 4.0–10.5)
nRBC: 0 % (ref 0.0–0.2)

## 2020-01-22 LAB — CBG MONITORING, ED: Glucose-Capillary: 161 mg/dL — ABNORMAL HIGH (ref 70–99)

## 2020-01-22 MED ORDER — SODIUM CHLORIDE 0.9% FLUSH
3.0000 mL | Freq: Once | INTRAVENOUS | Status: AC
  Administered 2020-01-22: 3 mL via INTRAVENOUS

## 2020-01-22 NOTE — ED Triage Notes (Signed)
Facility found pt outside slumped over and is more weak than normal. Per another resident pt has been outside all day. Pt is confused but has history of dementia.

## 2020-01-22 NOTE — ED Notes (Signed)
Patient ambulatory to the restroom with minimal assistance 

## 2020-01-22 NOTE — Discharge Instructions (Addendum)
Work-up here without any acute abnormalities.  Patient able to ambulate fine.  Patient stable to go back to high [place].  Return for any new or worse or recurrent symptoms.

## 2020-01-22 NOTE — ED Provider Notes (Signed)
[facility] EMERGENCY DEPARTMENT Provider Note   CSN: [id number] Arrival date & time: 01/22/20  1926     History Chief Complaint  Patient presents with  . Weakness    [name] is a 86 y.o. male.  Patient sent in from high [place].  Patient was found outside slumped over seem to be more weak than usual.  Patient has a history of dementia there was some confusion.  Upon arrival here patient seems to be back to baseline.  States he feels fine now.  Patient able to ambulate here.        Past Medical History:  Diagnosis Date  . [name] deficiency 2011  . Bladder cancer ([place])   . CAD (coronary artery disease)   . Dementia ([place])   . Hx of colonic polyp   . Hyperlipidemia   . Memory loss 2011    Patient Active Problem List   Diagnosis Date Noted  . Hygroma 09/10/2018  . 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 Chest pain 12/29/2013  . Atrial fibrillation ([place]) 12/29/2013  . VERTIGO 02/04/2010  . [name] DEFICIENCY 01/31/2010  . Memory loss 01/27/2010  . PARESTHESIA 01/27/2010  . Hyperlipidemia 11/02/2009  . CORONARY ARTERY DISEASE 11/02/2009  . SINUSITIS, ACUTE 11/02/2009  . EPISTAXIS 11/02/2009  . COLONIC POLYPS, HX OF 11/02/2009    Past Surgical History:  Procedure Laterality Date  . CHOLECYSTECTOMY         Family History  Problem Relation Age of Onset  . Coronary artery disease Other   . Dementia Other     Social History   Tobacco Use  . Smoking status: Former Smoker    Quit date: 12/29/1973    Years since quitting: 46.0  . Smokeless tobacco: Never Used  Vaping Use  . Vaping Use: Never used  Substance Use Topics  . Alcohol use: No  . Drug use: Never    Home Medications Prior to Admission medications   Medication Sig Start Date End Date Taking? Authorizing Provider  acetaminophen (TYLENOL) 500 MG tablet Take 500 mg by mouth every 6 (six) hours as needed for mild pain, fever or headache.    [provider]  atorvastatin (LIPITOR) 20 MG tablet Take 1 tablet (20 mg total) by mouth daily at 6 PM. 09/14/18   Rinehuls, Early Chars, PA-C  doxycycline (VIBRAMYCIN) 100 MG capsule Take 1 capsule (100 mg total) by mouth 2 (two) times daily. One po bid x 7 days Patient not taking: Reported on 11/30/2019 10/04/19   Deno Etienne, DO  midodrine (PROAMATINE) 5 MG tablet Take 5 mg by mouth 2 (two) times daily with a meal.    [provider]  rivaroxaban (XARELTO) 20 MG TABS tablet Take 20 mg by mouth daily with supper.    [provider]    Allergies    Patient has no known allergies.  Review of Systems   Review of Systems  Constitutional: Positive for fatigue. Negative for chills and fever.  HENT: Negative for congestion, rhinorrhea and sore throat.   Eyes: Negative for visual disturbance.  Respiratory: Negative for cough and shortness of breath.   Cardiovascular: Negative for chest pain and leg swelling.  Gastrointestinal: Negative for abdominal pain, diarrhea, nausea and vomiting.  Genitourinary: Negative for dysuria.  Musculoskeletal: Negative for back pain and neck pain.  Skin: Negative for rash.  Neurological: Positive for weakness. Negative for dizziness, light-headedness and headaches.  Hematological: Does not bruise/bleed easily.  Psychiatric/Behavioral:  Negative for confusion.    Physical Exam Updated Vital Signs BP (!) 144/68   Pulse 64   Temp (!) 97.5 F (36.4 C) (Oral)   Resp 18   SpO2 98%   Physical Exam Vitals and nursing note reviewed.  Constitutional:      Appearance: Normal appearance. He is well-developed.  HENT:     Head: Normocephalic and atraumatic.  Eyes:     Extraocular Movements: Extraocular movements intact.     Conjunctiva/sclera: Conjunctivae normal.     Pupils: Pupils are equal, round, and reactive to light.  Cardiovascular:     Rate and Rhythm: Normal rate and regular rhythm.     Heart sounds: No murmur heard.   Pulmonary:     Effort:  Pulmonary effort is normal. No respiratory distress.     Breath sounds: Normal breath sounds.  Abdominal:     Palpations: Abdomen is soft.     Tenderness: There is no abdominal tenderness.  Musculoskeletal:        General: No swelling. Normal range of motion.     Cervical back: Normal range of motion and neck supple.  Skin:    General: Skin is warm and dry.     Capillary Refill: Capillary refill takes less than 2 seconds.  Neurological:     General: No focal deficit present.     Mental Status: He is alert. Mental status is at baseline.     Cranial Nerves: No cranial nerve deficit.     Sensory: No sensory deficit.     Motor: No weakness.     Comments: Patient alert and talkative.  Patient able to ambulate here in the ED.  No obvious focal deficits.  Follows commands extremely well.     ED Results / Procedures / Treatments   Labs (all labs ordered are listed, but only abnormal results are displayed) Labs Reviewed  BASIC METABOLIC PANEL - Abnormal; Notable for the following components:      Result Value   Glucose, Bld 170 (*)    BUN 26 (*)    GFR calc non Af Amer 56 (*)    All other components within normal limits  CBC - Abnormal; Notable for the following components:   RBC 3.71 (*)    Hemoglobin 12.2 (*)    HCT 36.9 (*)    All other components within normal limits  URINALYSIS, ROUTINE W REFLEX MICROSCOPIC - Abnormal; Notable for the following components:   Color, Urine STRAW (*)    All other components within normal limits  CBG MONITORING, ED - Abnormal; Notable for the following components:   Glucose-Capillary 161 (*)    All other components within normal limits    EKG None  Radiology No results found.  Procedures Procedures (including critical care time)  Medications Ordered in ED Medications  sodium chloride flush (NS) 0.9 % injection 3 mL (3 mLs Intravenous Given 01/22/20 1956)    ED Course  I have reviewed the triage vital signs and the nursing  notes.  Pertinent labs & imaging results that were available during my care of the patient were reviewed by me and considered in my medical decision making (see chart for details).    MDM Rules/Calculators/A&P                         Work-up here without any acute findings.  Vital signs without any significant abnormality.  Labs without any significant abnormalities.  In addition urinalysis negative  for any urinary tract infection.  Patient ambulating fine here.  Patient without any complaints.  Patient stable for discharge back to high [place].  Final Clinical Impression(s) / ED Diagnoses Final diagnoses:  Generalized weakness    Rx / DC Orders ED Discharge Orders    None       Fredia Sorrow, MD 01/22/20 2334

## 2020-01-27 DIAGNOSIS — R296 Repeated falls: Secondary | ICD-10-CM | POA: Diagnosis not present

## 2020-01-27 DIAGNOSIS — G301 Alzheimer's disease with late onset: Secondary | ICD-10-CM | POA: Diagnosis not present

## 2020-01-27 DIAGNOSIS — R531 Weakness: Secondary | ICD-10-CM | POA: Diagnosis not present

## 2020-02-04 DIAGNOSIS — N182 Chronic kidney disease, stage 2 (mild): Secondary | ICD-10-CM | POA: Diagnosis not present

## 2020-02-04 DIAGNOSIS — G301 Alzheimer's disease with late onset: Secondary | ICD-10-CM | POA: Diagnosis not present

## 2020-02-04 DIAGNOSIS — I69351 Hemiplegia and hemiparesis following cerebral infarction affecting right dominant side: Secondary | ICD-10-CM | POA: Diagnosis not present

## 2020-02-04 DIAGNOSIS — Z7901 Long term (current) use of anticoagulants: Secondary | ICD-10-CM | POA: Diagnosis not present

## 2020-02-04 DIAGNOSIS — I480 Paroxysmal atrial fibrillation: Secondary | ICD-10-CM | POA: Diagnosis not present

## 2020-02-04 DIAGNOSIS — R296 Repeated falls: Secondary | ICD-10-CM | POA: Diagnosis not present

## 2020-02-04 DIAGNOSIS — F0280 Dementia in other diseases classified elsewhere without behavioral disturbance: Secondary | ICD-10-CM | POA: Diagnosis not present

## 2020-02-04 DIAGNOSIS — I129 Hypertensive chronic kidney disease with stage 1 through stage 4 chronic kidney disease, or unspecified chronic kidney disease: Secondary | ICD-10-CM | POA: Diagnosis not present

## 2020-02-10 DIAGNOSIS — Z7901 Long term (current) use of anticoagulants: Secondary | ICD-10-CM | POA: Diagnosis not present

## 2020-02-10 DIAGNOSIS — R296 Repeated falls: Secondary | ICD-10-CM | POA: Diagnosis not present

## 2020-02-10 DIAGNOSIS — F0280 Dementia in other diseases classified elsewhere without behavioral disturbance: Secondary | ICD-10-CM | POA: Diagnosis not present

## 2020-02-10 DIAGNOSIS — G301 Alzheimer's disease with late onset: Secondary | ICD-10-CM | POA: Diagnosis not present

## 2020-02-10 DIAGNOSIS — I69351 Hemiplegia and hemiparesis following cerebral infarction affecting right dominant side: Secondary | ICD-10-CM | POA: Diagnosis not present

## 2020-02-10 DIAGNOSIS — I129 Hypertensive chronic kidney disease with stage 1 through stage 4 chronic kidney disease, or unspecified chronic kidney disease: Secondary | ICD-10-CM | POA: Diagnosis not present

## 2020-02-10 DIAGNOSIS — N182 Chronic kidney disease, stage 2 (mild): Secondary | ICD-10-CM | POA: Diagnosis not present

## 2020-02-10 DIAGNOSIS — I480 Paroxysmal atrial fibrillation: Secondary | ICD-10-CM | POA: Diagnosis not present

## 2020-02-12 DIAGNOSIS — G301 Alzheimer's disease with late onset: Secondary | ICD-10-CM | POA: Diagnosis not present

## 2020-02-12 DIAGNOSIS — N182 Chronic kidney disease, stage 2 (mild): Secondary | ICD-10-CM | POA: Diagnosis not present

## 2020-02-12 DIAGNOSIS — F0280 Dementia in other diseases classified elsewhere without behavioral disturbance: Secondary | ICD-10-CM | POA: Diagnosis not present

## 2020-02-12 DIAGNOSIS — I129 Hypertensive chronic kidney disease with stage 1 through stage 4 chronic kidney disease, or unspecified chronic kidney disease: Secondary | ICD-10-CM | POA: Diagnosis not present

## 2020-02-12 DIAGNOSIS — R296 Repeated falls: Secondary | ICD-10-CM | POA: Diagnosis not present

## 2020-02-12 DIAGNOSIS — I480 Paroxysmal atrial fibrillation: Secondary | ICD-10-CM | POA: Diagnosis not present

## 2020-02-12 DIAGNOSIS — Z7901 Long term (current) use of anticoagulants: Secondary | ICD-10-CM | POA: Diagnosis not present

## 2020-02-12 DIAGNOSIS — I69351 Hemiplegia and hemiparesis following cerebral infarction affecting right dominant side: Secondary | ICD-10-CM | POA: Diagnosis not present

## 2020-02-23 DIAGNOSIS — R296 Repeated falls: Secondary | ICD-10-CM | POA: Diagnosis not present

## 2020-02-23 DIAGNOSIS — I480 Paroxysmal atrial fibrillation: Secondary | ICD-10-CM | POA: Diagnosis not present

## 2020-02-23 DIAGNOSIS — G301 Alzheimer's disease with late onset: Secondary | ICD-10-CM | POA: Diagnosis not present

## 2020-02-23 DIAGNOSIS — Z7901 Long term (current) use of anticoagulants: Secondary | ICD-10-CM | POA: Diagnosis not present

## 2020-02-23 DIAGNOSIS — N182 Chronic kidney disease, stage 2 (mild): Secondary | ICD-10-CM | POA: Diagnosis not present

## 2020-02-23 DIAGNOSIS — F0280 Dementia in other diseases classified elsewhere without behavioral disturbance: Secondary | ICD-10-CM | POA: Diagnosis not present

## 2020-02-23 DIAGNOSIS — I69351 Hemiplegia and hemiparesis following cerebral infarction affecting right dominant side: Secondary | ICD-10-CM | POA: Diagnosis not present

## 2020-02-23 DIAGNOSIS — I129 Hypertensive chronic kidney disease with stage 1 through stage 4 chronic kidney disease, or unspecified chronic kidney disease: Secondary | ICD-10-CM | POA: Diagnosis not present

## 2020-02-24 DIAGNOSIS — U071 COVID-19: Secondary | ICD-10-CM | POA: Diagnosis not present

## 2020-02-24 DIAGNOSIS — Z20828 Contact with and (suspected) exposure to other viral communicable diseases: Secondary | ICD-10-CM | POA: Diagnosis not present

## 2020-02-25 DIAGNOSIS — N182 Chronic kidney disease, stage 2 (mild): Secondary | ICD-10-CM | POA: Diagnosis not present

## 2020-02-25 DIAGNOSIS — R296 Repeated falls: Secondary | ICD-10-CM | POA: Diagnosis not present

## 2020-02-25 DIAGNOSIS — I480 Paroxysmal atrial fibrillation: Secondary | ICD-10-CM | POA: Diagnosis not present

## 2020-02-25 DIAGNOSIS — F0280 Dementia in other diseases classified elsewhere without behavioral disturbance: Secondary | ICD-10-CM | POA: Diagnosis not present

## 2020-02-25 DIAGNOSIS — I69351 Hemiplegia and hemiparesis following cerebral infarction affecting right dominant side: Secondary | ICD-10-CM | POA: Diagnosis not present

## 2020-02-25 DIAGNOSIS — Z7901 Long term (current) use of anticoagulants: Secondary | ICD-10-CM | POA: Diagnosis not present

## 2020-02-25 DIAGNOSIS — G301 Alzheimer's disease with late onset: Secondary | ICD-10-CM | POA: Diagnosis not present

## 2020-02-25 DIAGNOSIS — I129 Hypertensive chronic kidney disease with stage 1 through stage 4 chronic kidney disease, or unspecified chronic kidney disease: Secondary | ICD-10-CM | POA: Diagnosis not present

## 2020-02-27 DIAGNOSIS — N182 Chronic kidney disease, stage 2 (mild): Secondary | ICD-10-CM | POA: Diagnosis not present

## 2020-02-27 DIAGNOSIS — E785 Hyperlipidemia, unspecified: Secondary | ICD-10-CM | POA: Diagnosis not present

## 2020-03-01 DIAGNOSIS — N182 Chronic kidney disease, stage 2 (mild): Secondary | ICD-10-CM | POA: Diagnosis not present

## 2020-03-01 DIAGNOSIS — I69351 Hemiplegia and hemiparesis following cerebral infarction affecting right dominant side: Secondary | ICD-10-CM | POA: Diagnosis not present

## 2020-03-01 DIAGNOSIS — G301 Alzheimer's disease with late onset: Secondary | ICD-10-CM | POA: Diagnosis not present

## 2020-03-01 DIAGNOSIS — Z7901 Long term (current) use of anticoagulants: Secondary | ICD-10-CM | POA: Diagnosis not present

## 2020-03-01 DIAGNOSIS — R296 Repeated falls: Secondary | ICD-10-CM | POA: Diagnosis not present

## 2020-03-01 DIAGNOSIS — I129 Hypertensive chronic kidney disease with stage 1 through stage 4 chronic kidney disease, or unspecified chronic kidney disease: Secondary | ICD-10-CM | POA: Diagnosis not present

## 2020-03-01 DIAGNOSIS — I480 Paroxysmal atrial fibrillation: Secondary | ICD-10-CM | POA: Diagnosis not present

## 2020-03-01 DIAGNOSIS — F0280 Dementia in other diseases classified elsewhere without behavioral disturbance: Secondary | ICD-10-CM | POA: Diagnosis not present

## 2020-03-02 DIAGNOSIS — Z20828 Contact with and (suspected) exposure to other viral communicable diseases: Secondary | ICD-10-CM | POA: Diagnosis not present

## 2020-03-02 DIAGNOSIS — U071 COVID-19: Secondary | ICD-10-CM | POA: Diagnosis not present

## 2020-03-03 DIAGNOSIS — I69351 Hemiplegia and hemiparesis following cerebral infarction affecting right dominant side: Secondary | ICD-10-CM | POA: Diagnosis not present

## 2020-03-03 DIAGNOSIS — F0280 Dementia in other diseases classified elsewhere without behavioral disturbance: Secondary | ICD-10-CM | POA: Diagnosis not present

## 2020-03-03 DIAGNOSIS — I480 Paroxysmal atrial fibrillation: Secondary | ICD-10-CM | POA: Diagnosis not present

## 2020-03-03 DIAGNOSIS — I129 Hypertensive chronic kidney disease with stage 1 through stage 4 chronic kidney disease, or unspecified chronic kidney disease: Secondary | ICD-10-CM | POA: Diagnosis not present

## 2020-03-03 DIAGNOSIS — N182 Chronic kidney disease, stage 2 (mild): Secondary | ICD-10-CM | POA: Diagnosis not present

## 2020-03-03 DIAGNOSIS — R296 Repeated falls: Secondary | ICD-10-CM | POA: Diagnosis not present

## 2020-03-03 DIAGNOSIS — Z20828 Contact with and (suspected) exposure to other viral communicable diseases: Secondary | ICD-10-CM | POA: Diagnosis not present

## 2020-03-03 DIAGNOSIS — G301 Alzheimer's disease with late onset: Secondary | ICD-10-CM | POA: Diagnosis not present

## 2020-03-03 DIAGNOSIS — Z7901 Long term (current) use of anticoagulants: Secondary | ICD-10-CM | POA: Diagnosis not present

## 2020-03-03 DIAGNOSIS — U071 COVID-19: Secondary | ICD-10-CM | POA: Diagnosis not present

## 2020-03-04 DIAGNOSIS — Z20828 Contact with and (suspected) exposure to other viral communicable diseases: Secondary | ICD-10-CM | POA: Diagnosis not present

## 2020-03-04 DIAGNOSIS — U071 COVID-19: Secondary | ICD-10-CM | POA: Diagnosis not present

## 2020-03-08 DIAGNOSIS — Z7901 Long term (current) use of anticoagulants: Secondary | ICD-10-CM | POA: Diagnosis not present

## 2020-03-08 DIAGNOSIS — N182 Chronic kidney disease, stage 2 (mild): Secondary | ICD-10-CM | POA: Diagnosis not present

## 2020-03-08 DIAGNOSIS — R296 Repeated falls: Secondary | ICD-10-CM | POA: Diagnosis not present

## 2020-03-08 DIAGNOSIS — G301 Alzheimer's disease with late onset: Secondary | ICD-10-CM | POA: Diagnosis not present

## 2020-03-08 DIAGNOSIS — I129 Hypertensive chronic kidney disease with stage 1 through stage 4 chronic kidney disease, or unspecified chronic kidney disease: Secondary | ICD-10-CM | POA: Diagnosis not present

## 2020-03-08 DIAGNOSIS — F0280 Dementia in other diseases classified elsewhere without behavioral disturbance: Secondary | ICD-10-CM | POA: Diagnosis not present

## 2020-03-08 DIAGNOSIS — I69351 Hemiplegia and hemiparesis following cerebral infarction affecting right dominant side: Secondary | ICD-10-CM | POA: Diagnosis not present

## 2020-03-08 DIAGNOSIS — I480 Paroxysmal atrial fibrillation: Secondary | ICD-10-CM | POA: Diagnosis not present

## 2020-03-09 DIAGNOSIS — U071 COVID-19: Secondary | ICD-10-CM | POA: Diagnosis not present

## 2020-03-09 DIAGNOSIS — Z20828 Contact with and (suspected) exposure to other viral communicable diseases: Secondary | ICD-10-CM | POA: Diagnosis not present

## 2020-03-11 DIAGNOSIS — N182 Chronic kidney disease, stage 2 (mild): Secondary | ICD-10-CM | POA: Diagnosis not present

## 2020-03-11 DIAGNOSIS — R600 Localized edema: Secondary | ICD-10-CM | POA: Diagnosis not present

## 2020-03-11 DIAGNOSIS — Z20828 Contact with and (suspected) exposure to other viral communicable diseases: Secondary | ICD-10-CM | POA: Diagnosis not present

## 2020-03-11 DIAGNOSIS — Z7689 Persons encountering health services in other specified circumstances: Secondary | ICD-10-CM | POA: Diagnosis not present

## 2020-03-11 DIAGNOSIS — I480 Paroxysmal atrial fibrillation: Secondary | ICD-10-CM | POA: Diagnosis not present

## 2020-03-11 DIAGNOSIS — U071 COVID-19: Secondary | ICD-10-CM | POA: Diagnosis not present

## 2020-03-11 DIAGNOSIS — G301 Alzheimer's disease with late onset: Secondary | ICD-10-CM | POA: Diagnosis not present

## 2020-03-12 ENCOUNTER — Encounter: Payer: Self-pay | Admitting: Cardiology

## 2020-03-12 ENCOUNTER — Other Ambulatory Visit: Payer: Self-pay

## 2020-03-12 ENCOUNTER — Telehealth (INDEPENDENT_AMBULATORY_CARE_PROVIDER_SITE_OTHER): Payer: Medicare HMO | Admitting: Cardiology

## 2020-03-12 VITALS — BP 101/65 | HR 89 | Ht 74.0 in | Wt 161.0 lb

## 2020-03-12 DIAGNOSIS — I480 Paroxysmal atrial fibrillation: Secondary | ICD-10-CM | POA: Diagnosis not present

## 2020-03-12 DIAGNOSIS — N182 Chronic kidney disease, stage 2 (mild): Secondary | ICD-10-CM | POA: Diagnosis not present

## 2020-03-12 DIAGNOSIS — I5022 Chronic systolic (congestive) heart failure: Secondary | ICD-10-CM

## 2020-03-12 DIAGNOSIS — I69351 Hemiplegia and hemiparesis following cerebral infarction affecting right dominant side: Secondary | ICD-10-CM | POA: Diagnosis not present

## 2020-03-12 DIAGNOSIS — I959 Hypotension, unspecified: Secondary | ICD-10-CM

## 2020-03-12 DIAGNOSIS — I129 Hypertensive chronic kidney disease with stage 1 through stage 4 chronic kidney disease, or unspecified chronic kidney disease: Secondary | ICD-10-CM | POA: Diagnosis not present

## 2020-03-12 DIAGNOSIS — Z7901 Long term (current) use of anticoagulants: Secondary | ICD-10-CM | POA: Diagnosis not present

## 2020-03-12 DIAGNOSIS — I4821 Permanent atrial fibrillation: Secondary | ICD-10-CM | POA: Diagnosis not present

## 2020-03-12 DIAGNOSIS — F0280 Dementia in other diseases classified elsewhere without behavioral disturbance: Secondary | ICD-10-CM | POA: Diagnosis not present

## 2020-03-12 DIAGNOSIS — R296 Repeated falls: Secondary | ICD-10-CM | POA: Diagnosis not present

## 2020-03-12 DIAGNOSIS — G301 Alzheimer's disease with late onset: Secondary | ICD-10-CM | POA: Diagnosis not present

## 2020-03-12 NOTE — Progress Notes (Signed)
Virtual Visit via Telephone Note   This visit type was conducted due to national recommendations for restrictions regarding the COVID-19 Pandemic (e.g. social distancing) in an effort to limit this patient's exposure and mitigate transmission in our community.  Due to his co-morbid illnesses, this patient is at least at moderate risk for complications without adequate follow up.  This format is felt to be most appropriate for this patient at this time.  The patient did not have access to video technology/had technical difficulties with video requiring transitioning to audio format only (telephone).  All issues noted in this document were discussed and addressed.  No physical exam could be performed with this format.  Please refer to the patient's chart for his  consent to telehealth for [facility].    Date:  03/12/2020   ID:  [name], DOB 1930-02-22, MRN [id number] The patient was identified using 2 identifiers.  Patient Location: Home Provider Location: Office/Clinic  PCP:  Rosita Fire, MD  Cardiologist:  Dr Harl Bowie Electrophysiologist:  None   Evaluation Performed:  Follow-Up Visit  Chief Complaint:  Follow up visit  History of Present Illness:    [name] is a 86 y.o. male last seen by Dr Bronson Ing, this is our first visit together. Seen for the following medical problems   1. Permanent Afib - has not required av nodal agents - on xarelto - CrCl calculated at 46 today, should be on xarelto 15mg  daily.   - no recent palpitaitons   2. Chronic systolic HF - 01/8294 echo LVEF 40-45% - has not had ischemic evaluation due to dementia, advanced age and comorbidities - medical therapy limited, and essentially prohibited by his significant low bp's  - no recent SOB/DOE. Some recent edema. Taking furosemide x 2 weeks.   3. Chronic hypotension - has been on midodrine - occasional lightheadedness or dizziness, infrequent.    4. Dementia  5.  Prior  CVA - CVA in 09/2018  The patient does not have symptoms concerning for COVID-19 infection (fever, chills, cough, or new shortness of breath).    Past Medical History:  Diagnosis Date  . [name] deficiency 2011  . Bladder cancer ([place])   . CAD (coronary artery disease)   . Dementia ([place])   . Hx of colonic polyp   . Hyperlipidemia   . Memory loss 2011   Past Surgical History:  Procedure Laterality Date  . CHOLECYSTECTOMY       No outpatient medications have been marked as taking for the 03/12/20 encounter (Appointment) with Arnoldo Lenis, MD.     Allergies:   Patient has no known allergies.   Social History   Tobacco Use  . Smoking status: Former Smoker    Quit date: 12/29/1973    Years since quitting: 46.2  . Smokeless tobacco: Never Used  Vaping Use  . Vaping Use: Never used  Substance Use Topics  . Alcohol use: No  . Drug use: Never     Family Hx: The patient's family history includes Coronary artery disease in an other family member; Dementia in an other family member.  ROS:   Please see the history of present illness.     All other systems reviewed and are negative.   Prior CV studies:   The following studies were reviewed today:  Echo 09/10/18:  1. The left ventricle has mild-moderately reduced systolic function, with an ejection fraction of 40-45%. The cavity size was normal. There is mildly increased left ventricular wall  thickness. Left ventricular diastolic Doppler parameters are  indeterminate. 2. Hypokinesis of the apical, mid to apical anteroseptal, mid-apical inferoseptal, apical anterior, and apical anteroseptal myocardium. 3. The right ventricle has normal systolic function. The cavity was normal. There is no increase in right ventricular wall thickness. 4. Left atrial size was mildly dilated. 5. Right atrial size was severely dilated. 6. The mitral valve is normal in structure. 7. The tricuspid valve is normal in structure. 8. The  aortic valve is tricuspid Aortic valve regurgitation is mild by color flow Doppler. 9. The pulmonic valve was normal in structure. Pulmonic valve regurgitation is mild by color flow Doppler. 10. Large patent foramen ovale. 11. Cannot rule out PFO by color flow Doppler.  Labs/Other Tests and Data Reviewed:    EKG:  No ECG reviewed.  Recent Labs: 12/22/2019: ALT 8 01/22/2020: BUN 26; Creatinine, Ser 1.17; Hemoglobin 12.2; Platelets 154; Potassium 4.1; Sodium 139   Recent Lipid Panel Lab Results  Component Value Date/Time   CHOL 171 09/12/2018 03:54 AM   TRIG 89 09/12/2018 03:54 AM   HDL 35 (L) 09/12/2018 03:54 AM   CHOLHDL 4.9 09/12/2018 03:54 AM   LDLCALC 118 (H) 09/12/2018 03:54 AM    Wt Readings from Last 3 Encounters:  12/22/19 198 lb (89.8 kg)  12/04/19 163 lb 2.3 oz (74 kg)  11/30/19 162 lb 14.7 oz (73.9 kg)     Objective:    Vital Signs:  Today's Vitals   03/12/20 1252  BP: 101/65  Pulse: 89  Weight: 161 lb (73 kg)  Height: 6\' 2"  (1.88 m)   Body mass index is 20.67 kg/m. Normal affect. Normal speech pattern and tone. Comfortable, no apparent distress. No audible signs of sob or wheezing.   ASSESSMENT & PLAN:    1. Permanent afib - not requiring av nodal agents. Asymptomatic - nursing home reports he is on xarelto 15mg  daily, which based on his CrCl of 46 is correct dose, we will update our list  2. Chronic systolic - no SOB/DOE, started on lasix by pcp for some LE edema - low bp's prohibit any medical therapy - continue lasix  3. Hypotension - on midodrine, bp to day low normal. Mild infrequent dizzienss, monitor at this time.   COVID-19 Education: The signs and symptoms of COVID-19 were discussed with the patient and how to seek care for testing (follow up with PCP or arrange E-visit).  The importance of social distancing was discussed today.  Time:   Today, I have spent 16 minutes with the patient with telehealth technology discussing the above  problems.     Medication Adjustments/Labs and Tests Ordered: Current medicines are reviewed at length with the patient today.  Concerns regarding medicines are outlined above.   Tests Ordered: No orders of the defined types were placed in this encounter.   Medication Changes: No orders of the defined types were placed in this encounter.   Follow Up:  Virtual Visit  in 6 month(s)  Signed, Carlyle Dolly, MD  03/12/2020 8:52 AM    [place]

## 2020-03-12 NOTE — Addendum Note (Signed)
Addended by: Levonne Hubert on: 03/12/2020 01:35 PM   Modules accepted: Orders

## 2020-03-12 NOTE — Patient Instructions (Signed)
Medication Instructions:  Your physician recommends that you continue on your current medications as directed. Please refer to the Current Medication list given to you today.  *If you need a refill on your cardiac medications before your next appointment, please call your pharmacy*   Lab Work: NONE   If you have labs (blood work) drawn today and your tests are completely normal, you will receive your results only by: . MyChart Message (if you have MyChart) OR . A paper copy in the mail If you have any lab test that is abnormal or we need to change your treatment, we will call you to review the results.   Testing/Procedures: NONE    Follow-Up: At [facility], you and your health needs are our priority.  As part of our continuing mission to provide you with exceptional heart care, we have created designated Provider Care Teams.  These Care Teams include your primary Cardiologist (physician) and Advanced Practice Providers (APPs -  Physician Assistants and Nurse Practitioners) who all work together to provide you with the care you need, when you need it.  We recommend signing up for the patient portal called "MyChart".  Sign up information is provided on this After Visit Summary.  MyChart is used to connect with patients for Virtual Visits (Telemedicine).  Patients are able to view lab/test results, encounter notes, upcoming appointments, etc.  Non-urgent messages can be sent to your provider as well.   To learn more about what you can do with MyChart, go to [email or web address].    Your next appointment:   6 month(s)  The format for your next appointment:   In Person  Provider:   Jonathan Branch, MD   Other Instructions Thank you for choosing [facility]!    

## 2020-03-16 DIAGNOSIS — I480 Paroxysmal atrial fibrillation: Secondary | ICD-10-CM | POA: Diagnosis not present

## 2020-03-16 DIAGNOSIS — R296 Repeated falls: Secondary | ICD-10-CM | POA: Diagnosis not present

## 2020-03-16 DIAGNOSIS — U071 COVID-19: Secondary | ICD-10-CM | POA: Diagnosis not present

## 2020-03-16 DIAGNOSIS — F0280 Dementia in other diseases classified elsewhere without behavioral disturbance: Secondary | ICD-10-CM | POA: Diagnosis not present

## 2020-03-16 DIAGNOSIS — Z7901 Long term (current) use of anticoagulants: Secondary | ICD-10-CM | POA: Diagnosis not present

## 2020-03-16 DIAGNOSIS — I129 Hypertensive chronic kidney disease with stage 1 through stage 4 chronic kidney disease, or unspecified chronic kidney disease: Secondary | ICD-10-CM | POA: Diagnosis not present

## 2020-03-16 DIAGNOSIS — I69351 Hemiplegia and hemiparesis following cerebral infarction affecting right dominant side: Secondary | ICD-10-CM | POA: Diagnosis not present

## 2020-03-16 DIAGNOSIS — G301 Alzheimer's disease with late onset: Secondary | ICD-10-CM | POA: Diagnosis not present

## 2020-03-16 DIAGNOSIS — N182 Chronic kidney disease, stage 2 (mild): Secondary | ICD-10-CM | POA: Diagnosis not present

## 2020-03-16 DIAGNOSIS — Z20828 Contact with and (suspected) exposure to other viral communicable diseases: Secondary | ICD-10-CM | POA: Diagnosis not present

## 2020-03-17 DIAGNOSIS — Z20828 Contact with and (suspected) exposure to other viral communicable diseases: Secondary | ICD-10-CM | POA: Diagnosis not present

## 2020-03-17 DIAGNOSIS — U071 COVID-19: Secondary | ICD-10-CM | POA: Diagnosis not present

## 2020-03-18 DIAGNOSIS — G301 Alzheimer's disease with late onset: Secondary | ICD-10-CM | POA: Diagnosis not present

## 2020-03-18 DIAGNOSIS — N182 Chronic kidney disease, stage 2 (mild): Secondary | ICD-10-CM | POA: Diagnosis not present

## 2020-03-18 DIAGNOSIS — F0280 Dementia in other diseases classified elsewhere without behavioral disturbance: Secondary | ICD-10-CM | POA: Diagnosis not present

## 2020-03-18 DIAGNOSIS — I69351 Hemiplegia and hemiparesis following cerebral infarction affecting right dominant side: Secondary | ICD-10-CM | POA: Diagnosis not present

## 2020-03-18 DIAGNOSIS — Z20828 Contact with and (suspected) exposure to other viral communicable diseases: Secondary | ICD-10-CM | POA: Diagnosis not present

## 2020-03-18 DIAGNOSIS — U071 COVID-19: Secondary | ICD-10-CM | POA: Diagnosis not present

## 2020-03-18 DIAGNOSIS — I480 Paroxysmal atrial fibrillation: Secondary | ICD-10-CM | POA: Diagnosis not present

## 2020-03-18 DIAGNOSIS — Z7901 Long term (current) use of anticoagulants: Secondary | ICD-10-CM | POA: Diagnosis not present

## 2020-03-18 DIAGNOSIS — I129 Hypertensive chronic kidney disease with stage 1 through stage 4 chronic kidney disease, or unspecified chronic kidney disease: Secondary | ICD-10-CM | POA: Diagnosis not present

## 2020-03-18 DIAGNOSIS — R296 Repeated falls: Secondary | ICD-10-CM | POA: Diagnosis not present

## 2020-03-21 DIAGNOSIS — I69351 Hemiplegia and hemiparesis following cerebral infarction affecting right dominant side: Secondary | ICD-10-CM | POA: Diagnosis not present

## 2020-03-21 DIAGNOSIS — I480 Paroxysmal atrial fibrillation: Secondary | ICD-10-CM | POA: Diagnosis not present

## 2020-03-21 DIAGNOSIS — F0280 Dementia in other diseases classified elsewhere without behavioral disturbance: Secondary | ICD-10-CM | POA: Diagnosis not present

## 2020-03-21 DIAGNOSIS — N182 Chronic kidney disease, stage 2 (mild): Secondary | ICD-10-CM | POA: Diagnosis not present

## 2020-03-21 DIAGNOSIS — Z7901 Long term (current) use of anticoagulants: Secondary | ICD-10-CM | POA: Diagnosis not present

## 2020-03-21 DIAGNOSIS — R296 Repeated falls: Secondary | ICD-10-CM | POA: Diagnosis not present

## 2020-03-21 DIAGNOSIS — I129 Hypertensive chronic kidney disease with stage 1 through stage 4 chronic kidney disease, or unspecified chronic kidney disease: Secondary | ICD-10-CM | POA: Diagnosis not present

## 2020-03-21 DIAGNOSIS — G301 Alzheimer's disease with late onset: Secondary | ICD-10-CM | POA: Diagnosis not present

## 2020-03-22 DIAGNOSIS — Z20828 Contact with and (suspected) exposure to other viral communicable diseases: Secondary | ICD-10-CM | POA: Diagnosis not present

## 2020-03-22 DIAGNOSIS — U071 COVID-19: Secondary | ICD-10-CM | POA: Diagnosis not present

## 2020-03-25 DIAGNOSIS — U071 COVID-19: Secondary | ICD-10-CM | POA: Diagnosis not present

## 2020-03-25 DIAGNOSIS — Z7901 Long term (current) use of anticoagulants: Secondary | ICD-10-CM | POA: Diagnosis not present

## 2020-03-25 DIAGNOSIS — I480 Paroxysmal atrial fibrillation: Secondary | ICD-10-CM | POA: Diagnosis not present

## 2020-03-25 DIAGNOSIS — G301 Alzheimer's disease with late onset: Secondary | ICD-10-CM | POA: Diagnosis not present

## 2020-03-25 DIAGNOSIS — N182 Chronic kidney disease, stage 2 (mild): Secondary | ICD-10-CM | POA: Diagnosis not present

## 2020-03-25 DIAGNOSIS — I69351 Hemiplegia and hemiparesis following cerebral infarction affecting right dominant side: Secondary | ICD-10-CM | POA: Diagnosis not present

## 2020-03-25 DIAGNOSIS — I129 Hypertensive chronic kidney disease with stage 1 through stage 4 chronic kidney disease, or unspecified chronic kidney disease: Secondary | ICD-10-CM | POA: Diagnosis not present

## 2020-03-25 DIAGNOSIS — Z20828 Contact with and (suspected) exposure to other viral communicable diseases: Secondary | ICD-10-CM | POA: Diagnosis not present

## 2020-03-25 DIAGNOSIS — F0280 Dementia in other diseases classified elsewhere without behavioral disturbance: Secondary | ICD-10-CM | POA: Diagnosis not present

## 2020-03-25 DIAGNOSIS — R296 Repeated falls: Secondary | ICD-10-CM | POA: Diagnosis not present

## 2020-03-29 DIAGNOSIS — F0280 Dementia in other diseases classified elsewhere without behavioral disturbance: Secondary | ICD-10-CM | POA: Diagnosis not present

## 2020-03-29 DIAGNOSIS — I480 Paroxysmal atrial fibrillation: Secondary | ICD-10-CM | POA: Diagnosis not present

## 2020-03-29 DIAGNOSIS — R296 Repeated falls: Secondary | ICD-10-CM | POA: Diagnosis not present

## 2020-03-29 DIAGNOSIS — N182 Chronic kidney disease, stage 2 (mild): Secondary | ICD-10-CM | POA: Diagnosis not present

## 2020-03-29 DIAGNOSIS — I69351 Hemiplegia and hemiparesis following cerebral infarction affecting right dominant side: Secondary | ICD-10-CM | POA: Diagnosis not present

## 2020-03-29 DIAGNOSIS — Z7901 Long term (current) use of anticoagulants: Secondary | ICD-10-CM | POA: Diagnosis not present

## 2020-03-29 DIAGNOSIS — I129 Hypertensive chronic kidney disease with stage 1 through stage 4 chronic kidney disease, or unspecified chronic kidney disease: Secondary | ICD-10-CM | POA: Diagnosis not present

## 2020-03-29 DIAGNOSIS — G301 Alzheimer's disease with late onset: Secondary | ICD-10-CM | POA: Diagnosis not present

## 2020-03-30 ENCOUNTER — Telehealth: Payer: Medicare HMO | Admitting: Cardiovascular Disease

## 2020-03-30 DIAGNOSIS — Z20828 Contact with and (suspected) exposure to other viral communicable diseases: Secondary | ICD-10-CM | POA: Diagnosis not present

## 2020-03-30 DIAGNOSIS — U071 COVID-19: Secondary | ICD-10-CM | POA: Diagnosis not present

## 2020-04-01 DIAGNOSIS — N182 Chronic kidney disease, stage 2 (mild): Secondary | ICD-10-CM | POA: Diagnosis not present

## 2020-04-01 DIAGNOSIS — U071 COVID-19: Secondary | ICD-10-CM | POA: Diagnosis not present

## 2020-04-01 DIAGNOSIS — I480 Paroxysmal atrial fibrillation: Secondary | ICD-10-CM | POA: Diagnosis not present

## 2020-04-01 DIAGNOSIS — I69351 Hemiplegia and hemiparesis following cerebral infarction affecting right dominant side: Secondary | ICD-10-CM | POA: Diagnosis not present

## 2020-04-01 DIAGNOSIS — F0280 Dementia in other diseases classified elsewhere without behavioral disturbance: Secondary | ICD-10-CM | POA: Diagnosis not present

## 2020-04-01 DIAGNOSIS — G301 Alzheimer's disease with late onset: Secondary | ICD-10-CM | POA: Diagnosis not present

## 2020-04-01 DIAGNOSIS — Z7901 Long term (current) use of anticoagulants: Secondary | ICD-10-CM | POA: Diagnosis not present

## 2020-04-01 DIAGNOSIS — I129 Hypertensive chronic kidney disease with stage 1 through stage 4 chronic kidney disease, or unspecified chronic kidney disease: Secondary | ICD-10-CM | POA: Diagnosis not present

## 2020-04-01 DIAGNOSIS — Z20828 Contact with and (suspected) exposure to other viral communicable diseases: Secondary | ICD-10-CM | POA: Diagnosis not present

## 2020-04-01 DIAGNOSIS — R296 Repeated falls: Secondary | ICD-10-CM | POA: Diagnosis not present

## 2020-04-05 DIAGNOSIS — G301 Alzheimer's disease with late onset: Secondary | ICD-10-CM | POA: Diagnosis not present

## 2020-04-05 DIAGNOSIS — R296 Repeated falls: Secondary | ICD-10-CM | POA: Diagnosis not present

## 2020-04-05 DIAGNOSIS — Z7901 Long term (current) use of anticoagulants: Secondary | ICD-10-CM | POA: Diagnosis not present

## 2020-04-05 DIAGNOSIS — I129 Hypertensive chronic kidney disease with stage 1 through stage 4 chronic kidney disease, or unspecified chronic kidney disease: Secondary | ICD-10-CM | POA: Diagnosis not present

## 2020-04-05 DIAGNOSIS — I480 Paroxysmal atrial fibrillation: Secondary | ICD-10-CM | POA: Diagnosis not present

## 2020-04-05 DIAGNOSIS — I69351 Hemiplegia and hemiparesis following cerebral infarction affecting right dominant side: Secondary | ICD-10-CM | POA: Diagnosis not present

## 2020-04-05 DIAGNOSIS — N182 Chronic kidney disease, stage 2 (mild): Secondary | ICD-10-CM | POA: Diagnosis not present

## 2020-04-05 DIAGNOSIS — F0280 Dementia in other diseases classified elsewhere without behavioral disturbance: Secondary | ICD-10-CM | POA: Diagnosis not present

## 2020-04-06 DIAGNOSIS — U071 COVID-19: Secondary | ICD-10-CM | POA: Diagnosis not present

## 2020-04-06 DIAGNOSIS — Z20828 Contact with and (suspected) exposure to other viral communicable diseases: Secondary | ICD-10-CM | POA: Diagnosis not present

## 2020-04-07 DIAGNOSIS — N182 Chronic kidney disease, stage 2 (mild): Secondary | ICD-10-CM | POA: Diagnosis not present

## 2020-04-07 DIAGNOSIS — I480 Paroxysmal atrial fibrillation: Secondary | ICD-10-CM | POA: Diagnosis not present

## 2020-04-07 DIAGNOSIS — G301 Alzheimer's disease with late onset: Secondary | ICD-10-CM | POA: Diagnosis not present

## 2020-04-07 DIAGNOSIS — Z7901 Long term (current) use of anticoagulants: Secondary | ICD-10-CM | POA: Diagnosis not present

## 2020-04-07 DIAGNOSIS — I69351 Hemiplegia and hemiparesis following cerebral infarction affecting right dominant side: Secondary | ICD-10-CM | POA: Diagnosis not present

## 2020-04-07 DIAGNOSIS — I129 Hypertensive chronic kidney disease with stage 1 through stage 4 chronic kidney disease, or unspecified chronic kidney disease: Secondary | ICD-10-CM | POA: Diagnosis not present

## 2020-04-07 DIAGNOSIS — R296 Repeated falls: Secondary | ICD-10-CM | POA: Diagnosis not present

## 2020-04-07 DIAGNOSIS — F0280 Dementia in other diseases classified elsewhere without behavioral disturbance: Secondary | ICD-10-CM | POA: Diagnosis not present

## 2020-04-08 DIAGNOSIS — Z20828 Contact with and (suspected) exposure to other viral communicable diseases: Secondary | ICD-10-CM | POA: Diagnosis not present

## 2020-04-08 DIAGNOSIS — U071 COVID-19: Secondary | ICD-10-CM | POA: Diagnosis not present

## 2020-04-12 DIAGNOSIS — I69351 Hemiplegia and hemiparesis following cerebral infarction affecting right dominant side: Secondary | ICD-10-CM | POA: Diagnosis not present

## 2020-04-12 DIAGNOSIS — R296 Repeated falls: Secondary | ICD-10-CM | POA: Diagnosis not present

## 2020-04-12 DIAGNOSIS — N182 Chronic kidney disease, stage 2 (mild): Secondary | ICD-10-CM | POA: Diagnosis not present

## 2020-04-12 DIAGNOSIS — I480 Paroxysmal atrial fibrillation: Secondary | ICD-10-CM | POA: Diagnosis not present

## 2020-04-12 DIAGNOSIS — G301 Alzheimer's disease with late onset: Secondary | ICD-10-CM | POA: Diagnosis not present

## 2020-04-12 DIAGNOSIS — I129 Hypertensive chronic kidney disease with stage 1 through stage 4 chronic kidney disease, or unspecified chronic kidney disease: Secondary | ICD-10-CM | POA: Diagnosis not present

## 2020-04-12 DIAGNOSIS — F0280 Dementia in other diseases classified elsewhere without behavioral disturbance: Secondary | ICD-10-CM | POA: Diagnosis not present

## 2020-04-12 DIAGNOSIS — Z7901 Long term (current) use of anticoagulants: Secondary | ICD-10-CM | POA: Diagnosis not present

## 2020-04-13 DIAGNOSIS — Z20828 Contact with and (suspected) exposure to other viral communicable diseases: Secondary | ICD-10-CM | POA: Diagnosis not present

## 2020-04-13 DIAGNOSIS — U071 COVID-19: Secondary | ICD-10-CM | POA: Diagnosis not present

## 2020-04-14 DIAGNOSIS — I129 Hypertensive chronic kidney disease with stage 1 through stage 4 chronic kidney disease, or unspecified chronic kidney disease: Secondary | ICD-10-CM | POA: Diagnosis not present

## 2020-04-14 DIAGNOSIS — Z7901 Long term (current) use of anticoagulants: Secondary | ICD-10-CM | POA: Diagnosis not present

## 2020-04-14 DIAGNOSIS — F0280 Dementia in other diseases classified elsewhere without behavioral disturbance: Secondary | ICD-10-CM | POA: Diagnosis not present

## 2020-04-14 DIAGNOSIS — R296 Repeated falls: Secondary | ICD-10-CM | POA: Diagnosis not present

## 2020-04-14 DIAGNOSIS — I69351 Hemiplegia and hemiparesis following cerebral infarction affecting right dominant side: Secondary | ICD-10-CM | POA: Diagnosis not present

## 2020-04-14 DIAGNOSIS — I480 Paroxysmal atrial fibrillation: Secondary | ICD-10-CM | POA: Diagnosis not present

## 2020-04-14 DIAGNOSIS — G301 Alzheimer's disease with late onset: Secondary | ICD-10-CM | POA: Diagnosis not present

## 2020-04-14 DIAGNOSIS — N182 Chronic kidney disease, stage 2 (mild): Secondary | ICD-10-CM | POA: Diagnosis not present

## 2020-04-15 DIAGNOSIS — U071 COVID-19: Secondary | ICD-10-CM | POA: Diagnosis not present

## 2020-04-15 DIAGNOSIS — G301 Alzheimer's disease with late onset: Secondary | ICD-10-CM | POA: Diagnosis not present

## 2020-04-15 DIAGNOSIS — Z20828 Contact with and (suspected) exposure to other viral communicable diseases: Secondary | ICD-10-CM | POA: Diagnosis not present

## 2020-04-15 DIAGNOSIS — E785 Hyperlipidemia, unspecified: Secondary | ICD-10-CM | POA: Diagnosis not present

## 2020-04-19 DIAGNOSIS — R296 Repeated falls: Secondary | ICD-10-CM | POA: Diagnosis not present

## 2020-04-19 DIAGNOSIS — I480 Paroxysmal atrial fibrillation: Secondary | ICD-10-CM | POA: Diagnosis not present

## 2020-04-19 DIAGNOSIS — Z7901 Long term (current) use of anticoagulants: Secondary | ICD-10-CM | POA: Diagnosis not present

## 2020-04-19 DIAGNOSIS — I69351 Hemiplegia and hemiparesis following cerebral infarction affecting right dominant side: Secondary | ICD-10-CM | POA: Diagnosis not present

## 2020-04-19 DIAGNOSIS — N182 Chronic kidney disease, stage 2 (mild): Secondary | ICD-10-CM | POA: Diagnosis not present

## 2020-04-19 DIAGNOSIS — G301 Alzheimer's disease with late onset: Secondary | ICD-10-CM | POA: Diagnosis not present

## 2020-04-19 DIAGNOSIS — F0280 Dementia in other diseases classified elsewhere without behavioral disturbance: Secondary | ICD-10-CM | POA: Diagnosis not present

## 2020-04-19 DIAGNOSIS — I129 Hypertensive chronic kidney disease with stage 1 through stage 4 chronic kidney disease, or unspecified chronic kidney disease: Secondary | ICD-10-CM | POA: Diagnosis not present

## 2020-04-20 DIAGNOSIS — U071 COVID-19: Secondary | ICD-10-CM | POA: Diagnosis not present

## 2020-04-20 DIAGNOSIS — Z20828 Contact with and (suspected) exposure to other viral communicable diseases: Secondary | ICD-10-CM | POA: Diagnosis not present

## 2020-04-21 DIAGNOSIS — I69351 Hemiplegia and hemiparesis following cerebral infarction affecting right dominant side: Secondary | ICD-10-CM | POA: Diagnosis not present

## 2020-04-21 DIAGNOSIS — N182 Chronic kidney disease, stage 2 (mild): Secondary | ICD-10-CM | POA: Diagnosis not present

## 2020-04-21 DIAGNOSIS — R296 Repeated falls: Secondary | ICD-10-CM | POA: Diagnosis not present

## 2020-04-21 DIAGNOSIS — Z7901 Long term (current) use of anticoagulants: Secondary | ICD-10-CM | POA: Diagnosis not present

## 2020-04-21 DIAGNOSIS — G301 Alzheimer's disease with late onset: Secondary | ICD-10-CM | POA: Diagnosis not present

## 2020-04-21 DIAGNOSIS — I480 Paroxysmal atrial fibrillation: Secondary | ICD-10-CM | POA: Diagnosis not present

## 2020-04-21 DIAGNOSIS — I129 Hypertensive chronic kidney disease with stage 1 through stage 4 chronic kidney disease, or unspecified chronic kidney disease: Secondary | ICD-10-CM | POA: Diagnosis not present

## 2020-04-21 DIAGNOSIS — F0280 Dementia in other diseases classified elsewhere without behavioral disturbance: Secondary | ICD-10-CM | POA: Diagnosis not present

## 2020-04-22 DIAGNOSIS — Z20828 Contact with and (suspected) exposure to other viral communicable diseases: Secondary | ICD-10-CM | POA: Diagnosis not present

## 2020-04-22 DIAGNOSIS — U071 COVID-19: Secondary | ICD-10-CM | POA: Diagnosis not present

## 2020-04-26 DIAGNOSIS — R296 Repeated falls: Secondary | ICD-10-CM | POA: Diagnosis not present

## 2020-04-26 DIAGNOSIS — Z7901 Long term (current) use of anticoagulants: Secondary | ICD-10-CM | POA: Diagnosis not present

## 2020-04-26 DIAGNOSIS — I69351 Hemiplegia and hemiparesis following cerebral infarction affecting right dominant side: Secondary | ICD-10-CM | POA: Diagnosis not present

## 2020-04-26 DIAGNOSIS — N182 Chronic kidney disease, stage 2 (mild): Secondary | ICD-10-CM | POA: Diagnosis not present

## 2020-04-26 DIAGNOSIS — F0280 Dementia in other diseases classified elsewhere without behavioral disturbance: Secondary | ICD-10-CM | POA: Diagnosis not present

## 2020-04-26 DIAGNOSIS — I129 Hypertensive chronic kidney disease with stage 1 through stage 4 chronic kidney disease, or unspecified chronic kidney disease: Secondary | ICD-10-CM | POA: Diagnosis not present

## 2020-04-26 DIAGNOSIS — I480 Paroxysmal atrial fibrillation: Secondary | ICD-10-CM | POA: Diagnosis not present

## 2020-04-26 DIAGNOSIS — G301 Alzheimer's disease with late onset: Secondary | ICD-10-CM | POA: Diagnosis not present

## 2020-04-27 DIAGNOSIS — U071 COVID-19: Secondary | ICD-10-CM | POA: Diagnosis not present

## 2020-04-27 DIAGNOSIS — Z20828 Contact with and (suspected) exposure to other viral communicable diseases: Secondary | ICD-10-CM | POA: Diagnosis not present

## 2020-04-29 DIAGNOSIS — U071 COVID-19: Secondary | ICD-10-CM | POA: Diagnosis not present

## 2020-04-29 DIAGNOSIS — Z23 Encounter for immunization: Secondary | ICD-10-CM | POA: Diagnosis not present

## 2020-04-29 DIAGNOSIS — I129 Hypertensive chronic kidney disease with stage 1 through stage 4 chronic kidney disease, or unspecified chronic kidney disease: Secondary | ICD-10-CM | POA: Diagnosis not present

## 2020-04-29 DIAGNOSIS — Z20828 Contact with and (suspected) exposure to other viral communicable diseases: Secondary | ICD-10-CM | POA: Diagnosis not present

## 2020-04-29 DIAGNOSIS — R296 Repeated falls: Secondary | ICD-10-CM | POA: Diagnosis not present

## 2020-04-29 DIAGNOSIS — G301 Alzheimer's disease with late onset: Secondary | ICD-10-CM | POA: Diagnosis not present

## 2020-04-29 DIAGNOSIS — F0280 Dementia in other diseases classified elsewhere without behavioral disturbance: Secondary | ICD-10-CM | POA: Diagnosis not present

## 2020-04-29 DIAGNOSIS — N182 Chronic kidney disease, stage 2 (mild): Secondary | ICD-10-CM | POA: Diagnosis not present

## 2020-04-29 DIAGNOSIS — I69351 Hemiplegia and hemiparesis following cerebral infarction affecting right dominant side: Secondary | ICD-10-CM | POA: Diagnosis not present

## 2020-04-29 DIAGNOSIS — Z7901 Long term (current) use of anticoagulants: Secondary | ICD-10-CM | POA: Diagnosis not present

## 2020-04-29 DIAGNOSIS — I480 Paroxysmal atrial fibrillation: Secondary | ICD-10-CM | POA: Diagnosis not present

## 2020-05-04 DIAGNOSIS — U071 COVID-19: Secondary | ICD-10-CM | POA: Diagnosis not present

## 2020-05-04 DIAGNOSIS — Z20828 Contact with and (suspected) exposure to other viral communicable diseases: Secondary | ICD-10-CM | POA: Diagnosis not present

## 2020-05-05 DIAGNOSIS — I480 Paroxysmal atrial fibrillation: Secondary | ICD-10-CM | POA: Diagnosis not present

## 2020-05-05 DIAGNOSIS — Z7901 Long term (current) use of anticoagulants: Secondary | ICD-10-CM | POA: Diagnosis not present

## 2020-05-05 DIAGNOSIS — N182 Chronic kidney disease, stage 2 (mild): Secondary | ICD-10-CM | POA: Diagnosis not present

## 2020-05-05 DIAGNOSIS — G301 Alzheimer's disease with late onset: Secondary | ICD-10-CM | POA: Diagnosis not present

## 2020-05-05 DIAGNOSIS — I69351 Hemiplegia and hemiparesis following cerebral infarction affecting right dominant side: Secondary | ICD-10-CM | POA: Diagnosis not present

## 2020-05-05 DIAGNOSIS — I129 Hypertensive chronic kidney disease with stage 1 through stage 4 chronic kidney disease, or unspecified chronic kidney disease: Secondary | ICD-10-CM | POA: Diagnosis not present

## 2020-05-05 DIAGNOSIS — R296 Repeated falls: Secondary | ICD-10-CM | POA: Diagnosis not present

## 2020-05-05 DIAGNOSIS — F0280 Dementia in other diseases classified elsewhere without behavioral disturbance: Secondary | ICD-10-CM | POA: Diagnosis not present

## 2020-05-06 DIAGNOSIS — U071 COVID-19: Secondary | ICD-10-CM | POA: Diagnosis not present

## 2020-05-06 DIAGNOSIS — Z7901 Long term (current) use of anticoagulants: Secondary | ICD-10-CM | POA: Diagnosis not present

## 2020-05-06 DIAGNOSIS — I480 Paroxysmal atrial fibrillation: Secondary | ICD-10-CM | POA: Diagnosis not present

## 2020-05-06 DIAGNOSIS — I129 Hypertensive chronic kidney disease with stage 1 through stage 4 chronic kidney disease, or unspecified chronic kidney disease: Secondary | ICD-10-CM | POA: Diagnosis not present

## 2020-05-06 DIAGNOSIS — Z20828 Contact with and (suspected) exposure to other viral communicable diseases: Secondary | ICD-10-CM | POA: Diagnosis not present

## 2020-05-06 DIAGNOSIS — F0280 Dementia in other diseases classified elsewhere without behavioral disturbance: Secondary | ICD-10-CM | POA: Diagnosis not present

## 2020-05-06 DIAGNOSIS — R296 Repeated falls: Secondary | ICD-10-CM | POA: Diagnosis not present

## 2020-05-06 DIAGNOSIS — N182 Chronic kidney disease, stage 2 (mild): Secondary | ICD-10-CM | POA: Diagnosis not present

## 2020-05-06 DIAGNOSIS — G301 Alzheimer's disease with late onset: Secondary | ICD-10-CM | POA: Diagnosis not present

## 2020-05-06 DIAGNOSIS — I69351 Hemiplegia and hemiparesis following cerebral infarction affecting right dominant side: Secondary | ICD-10-CM | POA: Diagnosis not present

## 2020-05-10 DIAGNOSIS — I69351 Hemiplegia and hemiparesis following cerebral infarction affecting right dominant side: Secondary | ICD-10-CM | POA: Diagnosis not present

## 2020-05-10 DIAGNOSIS — F0280 Dementia in other diseases classified elsewhere without behavioral disturbance: Secondary | ICD-10-CM | POA: Diagnosis not present

## 2020-05-10 DIAGNOSIS — I480 Paroxysmal atrial fibrillation: Secondary | ICD-10-CM | POA: Diagnosis not present

## 2020-05-10 DIAGNOSIS — Z7901 Long term (current) use of anticoagulants: Secondary | ICD-10-CM | POA: Diagnosis not present

## 2020-05-10 DIAGNOSIS — G301 Alzheimer's disease with late onset: Secondary | ICD-10-CM | POA: Diagnosis not present

## 2020-05-10 DIAGNOSIS — R296 Repeated falls: Secondary | ICD-10-CM | POA: Diagnosis not present

## 2020-05-10 DIAGNOSIS — I129 Hypertensive chronic kidney disease with stage 1 through stage 4 chronic kidney disease, or unspecified chronic kidney disease: Secondary | ICD-10-CM | POA: Diagnosis not present

## 2020-05-10 DIAGNOSIS — N182 Chronic kidney disease, stage 2 (mild): Secondary | ICD-10-CM | POA: Diagnosis not present

## 2020-05-11 DIAGNOSIS — Z20828 Contact with and (suspected) exposure to other viral communicable diseases: Secondary | ICD-10-CM | POA: Diagnosis not present

## 2020-05-11 DIAGNOSIS — U071 COVID-19: Secondary | ICD-10-CM | POA: Diagnosis not present

## 2020-05-12 DIAGNOSIS — G301 Alzheimer's disease with late onset: Secondary | ICD-10-CM | POA: Diagnosis not present

## 2020-05-12 DIAGNOSIS — I69351 Hemiplegia and hemiparesis following cerebral infarction affecting right dominant side: Secondary | ICD-10-CM | POA: Diagnosis not present

## 2020-05-12 DIAGNOSIS — I129 Hypertensive chronic kidney disease with stage 1 through stage 4 chronic kidney disease, or unspecified chronic kidney disease: Secondary | ICD-10-CM | POA: Diagnosis not present

## 2020-05-12 DIAGNOSIS — R296 Repeated falls: Secondary | ICD-10-CM | POA: Diagnosis not present

## 2020-05-12 DIAGNOSIS — Z7901 Long term (current) use of anticoagulants: Secondary | ICD-10-CM | POA: Diagnosis not present

## 2020-05-12 DIAGNOSIS — F0280 Dementia in other diseases classified elsewhere without behavioral disturbance: Secondary | ICD-10-CM | POA: Diagnosis not present

## 2020-05-12 DIAGNOSIS — I480 Paroxysmal atrial fibrillation: Secondary | ICD-10-CM | POA: Diagnosis not present

## 2020-05-12 DIAGNOSIS — N182 Chronic kidney disease, stage 2 (mild): Secondary | ICD-10-CM | POA: Diagnosis not present

## 2020-05-13 DIAGNOSIS — Z20828 Contact with and (suspected) exposure to other viral communicable diseases: Secondary | ICD-10-CM | POA: Diagnosis not present

## 2020-05-13 DIAGNOSIS — U071 COVID-19: Secondary | ICD-10-CM | POA: Diagnosis not present

## 2020-05-16 DIAGNOSIS — N182 Chronic kidney disease, stage 2 (mild): Secondary | ICD-10-CM | POA: Diagnosis not present

## 2020-05-16 DIAGNOSIS — G301 Alzheimer's disease with late onset: Secondary | ICD-10-CM | POA: Diagnosis not present

## 2020-05-18 DIAGNOSIS — Z20828 Contact with and (suspected) exposure to other viral communicable diseases: Secondary | ICD-10-CM | POA: Diagnosis not present

## 2020-05-18 DIAGNOSIS — U071 COVID-19: Secondary | ICD-10-CM | POA: Diagnosis not present

## 2020-05-19 DIAGNOSIS — I129 Hypertensive chronic kidney disease with stage 1 through stage 4 chronic kidney disease, or unspecified chronic kidney disease: Secondary | ICD-10-CM | POA: Diagnosis not present

## 2020-05-19 DIAGNOSIS — Z7901 Long term (current) use of anticoagulants: Secondary | ICD-10-CM | POA: Diagnosis not present

## 2020-05-19 DIAGNOSIS — I69351 Hemiplegia and hemiparesis following cerebral infarction affecting right dominant side: Secondary | ICD-10-CM | POA: Diagnosis not present

## 2020-05-19 DIAGNOSIS — I480 Paroxysmal atrial fibrillation: Secondary | ICD-10-CM | POA: Diagnosis not present

## 2020-05-19 DIAGNOSIS — N182 Chronic kidney disease, stage 2 (mild): Secondary | ICD-10-CM | POA: Diagnosis not present

## 2020-05-19 DIAGNOSIS — R296 Repeated falls: Secondary | ICD-10-CM | POA: Diagnosis not present

## 2020-05-19 DIAGNOSIS — F0280 Dementia in other diseases classified elsewhere without behavioral disturbance: Secondary | ICD-10-CM | POA: Diagnosis not present

## 2020-05-19 DIAGNOSIS — G301 Alzheimer's disease with late onset: Secondary | ICD-10-CM | POA: Diagnosis not present

## 2020-05-20 DIAGNOSIS — U071 COVID-19: Secondary | ICD-10-CM | POA: Diagnosis not present

## 2020-05-20 DIAGNOSIS — Z20828 Contact with and (suspected) exposure to other viral communicable diseases: Secondary | ICD-10-CM | POA: Diagnosis not present

## 2020-05-25 DIAGNOSIS — U071 COVID-19: Secondary | ICD-10-CM | POA: Diagnosis not present

## 2020-05-25 DIAGNOSIS — Z20828 Contact with and (suspected) exposure to other viral communicable diseases: Secondary | ICD-10-CM | POA: Diagnosis not present

## 2020-05-26 DIAGNOSIS — I129 Hypertensive chronic kidney disease with stage 1 through stage 4 chronic kidney disease, or unspecified chronic kidney disease: Secondary | ICD-10-CM | POA: Diagnosis not present

## 2020-05-26 DIAGNOSIS — N182 Chronic kidney disease, stage 2 (mild): Secondary | ICD-10-CM | POA: Diagnosis not present

## 2020-05-26 DIAGNOSIS — G301 Alzheimer's disease with late onset: Secondary | ICD-10-CM | POA: Diagnosis not present

## 2020-05-26 DIAGNOSIS — I69351 Hemiplegia and hemiparesis following cerebral infarction affecting right dominant side: Secondary | ICD-10-CM | POA: Diagnosis not present

## 2020-05-26 DIAGNOSIS — Z7901 Long term (current) use of anticoagulants: Secondary | ICD-10-CM | POA: Diagnosis not present

## 2020-05-26 DIAGNOSIS — I480 Paroxysmal atrial fibrillation: Secondary | ICD-10-CM | POA: Diagnosis not present

## 2020-05-26 DIAGNOSIS — R296 Repeated falls: Secondary | ICD-10-CM | POA: Diagnosis not present

## 2020-05-26 DIAGNOSIS — F0280 Dementia in other diseases classified elsewhere without behavioral disturbance: Secondary | ICD-10-CM | POA: Diagnosis not present

## 2020-05-27 DIAGNOSIS — U071 COVID-19: Secondary | ICD-10-CM | POA: Diagnosis not present

## 2020-05-27 DIAGNOSIS — Z20828 Contact with and (suspected) exposure to other viral communicable diseases: Secondary | ICD-10-CM | POA: Diagnosis not present

## 2020-06-01 DIAGNOSIS — U071 COVID-19: Secondary | ICD-10-CM | POA: Diagnosis not present

## 2020-06-01 DIAGNOSIS — Z20828 Contact with and (suspected) exposure to other viral communicable diseases: Secondary | ICD-10-CM | POA: Diagnosis not present

## 2020-06-08 DIAGNOSIS — Z20828 Contact with and (suspected) exposure to other viral communicable diseases: Secondary | ICD-10-CM | POA: Diagnosis not present

## 2020-06-08 DIAGNOSIS — U071 COVID-19: Secondary | ICD-10-CM | POA: Diagnosis not present

## 2020-06-10 DIAGNOSIS — U071 COVID-19: Secondary | ICD-10-CM | POA: Diagnosis not present

## 2020-06-10 DIAGNOSIS — Z20828 Contact with and (suspected) exposure to other viral communicable diseases: Secondary | ICD-10-CM | POA: Diagnosis not present

## 2020-06-15 DIAGNOSIS — Z20828 Contact with and (suspected) exposure to other viral communicable diseases: Secondary | ICD-10-CM | POA: Diagnosis not present

## 2020-06-15 DIAGNOSIS — U071 COVID-19: Secondary | ICD-10-CM | POA: Diagnosis not present

## 2020-06-15 DIAGNOSIS — N182 Chronic kidney disease, stage 2 (mild): Secondary | ICD-10-CM | POA: Diagnosis not present

## 2020-06-15 DIAGNOSIS — E785 Hyperlipidemia, unspecified: Secondary | ICD-10-CM | POA: Diagnosis not present

## 2020-06-17 DIAGNOSIS — Z20828 Contact with and (suspected) exposure to other viral communicable diseases: Secondary | ICD-10-CM | POA: Diagnosis not present

## 2020-06-17 DIAGNOSIS — U071 COVID-19: Secondary | ICD-10-CM | POA: Diagnosis not present

## 2020-06-22 DIAGNOSIS — Z20828 Contact with and (suspected) exposure to other viral communicable diseases: Secondary | ICD-10-CM | POA: Diagnosis not present

## 2020-06-22 DIAGNOSIS — U071 COVID-19: Secondary | ICD-10-CM | POA: Diagnosis not present

## 2020-06-24 DIAGNOSIS — Z20828 Contact with and (suspected) exposure to other viral communicable diseases: Secondary | ICD-10-CM | POA: Diagnosis not present

## 2020-06-24 DIAGNOSIS — U071 COVID-19: Secondary | ICD-10-CM | POA: Diagnosis not present

## 2020-06-25 ENCOUNTER — Emergency Department (HOSPITAL_COMMUNITY): Payer: Medicare HMO

## 2020-06-25 ENCOUNTER — Emergency Department (HOSPITAL_COMMUNITY)
Admission: EM | Admit: 2020-06-25 | Discharge: 2020-06-25 | Disposition: A | Discharge status: Home / Self Care | Payer: Medicare HMO | Attending: Emergency Medicine | Admitting: Emergency Medicine

## 2020-06-25 ENCOUNTER — Encounter (HOSPITAL_COMMUNITY): Payer: Self-pay | Admitting: Emergency Medicine

## 2020-06-25 ENCOUNTER — Other Ambulatory Visit: Payer: Self-pay

## 2020-06-25 DIAGNOSIS — F0390 Unspecified dementia without behavioral disturbance: Secondary | ICD-10-CM | POA: Insufficient documentation

## 2020-06-25 DIAGNOSIS — Z87891 Personal history of nicotine dependence: Secondary | ICD-10-CM | POA: Insufficient documentation

## 2020-06-25 DIAGNOSIS — S20211A Contusion of right front wall of thorax, initial encounter: Secondary | ICD-10-CM | POA: Insufficient documentation

## 2020-06-25 DIAGNOSIS — S7001XA Contusion of right hip, initial encounter: Secondary | ICD-10-CM | POA: Insufficient documentation

## 2020-06-25 DIAGNOSIS — Z7901 Long term (current) use of anticoagulants: Secondary | ICD-10-CM | POA: Insufficient documentation

## 2020-06-25 DIAGNOSIS — W19XXXA Unspecified fall, initial encounter: Secondary | ICD-10-CM

## 2020-06-25 DIAGNOSIS — W1839XA Other fall on same level, initial encounter: Secondary | ICD-10-CM | POA: Diagnosis not present

## 2020-06-25 DIAGNOSIS — Z043 Encounter for examination and observation following other accident: Secondary | ICD-10-CM | POA: Diagnosis not present

## 2020-06-25 DIAGNOSIS — T07XXXA Unspecified multiple injuries, initial encounter: Secondary | ICD-10-CM

## 2020-06-25 DIAGNOSIS — S60511A Abrasion of right hand, initial encounter: Secondary | ICD-10-CM

## 2020-06-25 DIAGNOSIS — I2510 Atherosclerotic heart disease of native coronary artery without angina pectoris: Secondary | ICD-10-CM | POA: Insufficient documentation

## 2020-06-25 DIAGNOSIS — Z8551 Personal history of malignant neoplasm of bladder: Secondary | ICD-10-CM | POA: Insufficient documentation

## 2020-06-25 DIAGNOSIS — S0083XA Contusion of other part of head, initial encounter: Secondary | ICD-10-CM | POA: Diagnosis not present

## 2020-06-25 DIAGNOSIS — M47812 Spondylosis without myelopathy or radiculopathy, cervical region: Secondary | ICD-10-CM | POA: Insufficient documentation

## 2020-06-25 DIAGNOSIS — I959 Hypotension, unspecified: Secondary | ICD-10-CM | POA: Diagnosis not present

## 2020-06-25 DIAGNOSIS — S299XXA Unspecified injury of thorax, initial encounter: Secondary | ICD-10-CM | POA: Diagnosis present

## 2020-06-25 DIAGNOSIS — S40021A Contusion of right upper arm, initial encounter: Secondary | ICD-10-CM | POA: Diagnosis not present

## 2020-06-25 DIAGNOSIS — M79601 Pain in right arm: Secondary | ICD-10-CM | POA: Diagnosis not present

## 2020-06-25 LAB — BASIC METABOLIC PANEL
Anion gap: 8 (ref 5–15)
BUN: 26 mg/dL — ABNORMAL HIGH (ref 8–23)
CO2: 25 mmol/L (ref 22–32)
Calcium: 8.5 mg/dL — ABNORMAL LOW (ref 8.9–10.3)
Chloride: 102 mmol/L (ref 98–111)
Creatinine, Ser: 1.27 mg/dL — ABNORMAL HIGH (ref 0.61–1.24)
GFR, Estimated: 54 mL/min — ABNORMAL LOW (ref >60)
Glucose, Bld: 157 mg/dL — ABNORMAL HIGH (ref 70–99)
Potassium: 3.8 mmol/L (ref 3.5–5.1)
Sodium: 135 mmol/L (ref 135–145)

## 2020-06-25 LAB — CBC WITH DIFFERENTIAL/PLATELET
Abs Immature Granulocytes: 0.02 10*3/uL (ref 0.00–0.07)
Basophils Absolute: 0 10*3/uL (ref 0.0–0.1)
Basophils Relative: 1 %
Eosinophils Absolute: 0.2 10*3/uL (ref 0.0–0.5)
Eosinophils Relative: 4 %
HCT: 36 % — ABNORMAL LOW (ref 39.0–52.0)
Hemoglobin: 12 g/dL — ABNORMAL LOW (ref 13.0–17.0)
Immature Granulocytes: 0 %
Lymphocytes Relative: 19 %
Lymphs Abs: 1 10*3/uL (ref 0.7–4.0)
MCH: 32.7 pg (ref 26.0–34.0)
MCHC: 33.3 g/dL (ref 30.0–36.0)
MCV: 98.1 fL (ref 80.0–100.0)
Monocytes Absolute: 0.6 10*3/uL (ref 0.1–1.0)
Monocytes Relative: 11 %
Neutro Abs: 3.6 10*3/uL (ref 1.7–7.7)
Neutrophils Relative %: 65 %
Platelets: 165 10*3/uL (ref 150–400)
RBC: 3.67 MIL/uL — ABNORMAL LOW (ref 4.22–5.81)
RDW: 13 % (ref 11.5–15.5)
WBC: 5.4 10*3/uL (ref 4.0–10.5)
nRBC: 0 % (ref 0.0–0.2)

## 2020-06-25 NOTE — ED Notes (Signed)
Angel with [facility] was given updated Pt report and verbalized complete understanding, denies questions at this time. Glenard Haring advised a staff member from [employer] would be on their way to pickup Pt for Discharge.

## 2020-06-25 NOTE — Discharge Instructions (Addendum)
The testing today did not show any serious injuries.  For the abrasion of the right hand keep it clean with soap and water daily.  For any aches or pains, try taking Tylenol, 650 mg every 4 hours if needed.  Follow-up with your doctor for checkup for further care and treatment if needed.

## 2020-06-25 NOTE — ED Provider Notes (Signed)
[facility] EMERGENCY DEPARTMENT Provider Note   CSN: [id number] Arrival date & time: 06/25/20  2039     History Chief Complaint  Patient presents with  . [place] is a 86 y.o. male.  HPI Patient presents from his nursing care facility where he was felt to have fallen onto his right side from a standing position.  He is unable to recall what happened or tell me about it.  He arrives per EMS with reported pain in right arm, right ribs, right hip, right side of head, and possible new right-sided weakness.  He is anticoagulated with Xarelto.  Level 5 caveat-dementia    Past Medical History:  Diagnosis Date  . [name] deficiency 2011  . Bladder cancer ([place])   . CAD (coronary artery disease)   . Dementia ([place])   . Hx of colonic polyp   . Hyperlipidemia   . Memory loss 2011    Patient Active Problem List   Diagnosis Date Noted  . Hygroma 09/10/2018  . 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 Chest pain 12/29/2013  . Atrial fibrillation ([place]) 12/29/2013  . VERTIGO 02/04/2010  . [name] DEFICIENCY 01/31/2010  . Memory loss 01/27/2010  . PARESTHESIA 01/27/2010  . Hyperlipidemia 11/02/2009  . CORONARY ARTERY DISEASE 11/02/2009  . SINUSITIS, ACUTE 11/02/2009  . EPISTAXIS 11/02/2009  . COLONIC POLYPS, HX OF 11/02/2009    Past Surgical History:  Procedure Laterality Date  . CHOLECYSTECTOMY         Family History  Problem Relation Age of Onset  . Coronary artery disease Other   . Dementia Other     Social History   Tobacco Use  . Smoking status: Former Smoker    Quit date: 12/29/1973    Years since quitting: 46.5  . Smokeless tobacco: Never Used  Vaping Use  . Vaping Use: Never used  Substance Use Topics  . Alcohol use: No  . Drug use: Never    Home Medications Prior to Admission medications   Medication Sig Start Date End Date Taking? Authorizing Provider  acetaminophen (TYLENOL) 500 MG tablet Take 500 mg by  mouth every 6 (six) hours as needed for mild pain, fever or headache.    [provider]  atorvastatin (LIPITOR) 20 MG tablet Take 1 tablet (20 mg total) by mouth daily at 6 PM. 09/14/18   Rinehuls, Early Chars, PA-C  furosemide (LASIX) 20 MG tablet Take 20 mg by mouth daily. X 2 weeks 12/26/19   [provider]  midodrine (PROAMATINE) 5 MG tablet Take 5 mg by mouth 2 (two) times daily with a meal.    [provider]  Polyethylene Glycol 3350 (MIRALAX PO) Take by mouth. Take as needed for constipation    [provider]  Rivaroxaban (XARELTO) 15 MG TABS tablet Take 15 mg by mouth daily with supper.    [provider]    Allergies    Patient has no known allergies.  Review of Systems   Review of Systems  Unable to perform ROS: Dementia    Physical Exam Updated Vital Signs BP 104/75 (BP Location: Left Arm)   Pulse 77   Temp 98.6 F (37 C) (Oral)   Resp 17   Ht 6\' 2"  (1.88 m)   Wt 74 kg   SpO2 96%   BMI 20.95 kg/m   Physical Exam Vitals and nursing note reviewed.  Constitutional:      General: He is  not in acute distress.    Appearance: He is well-developed and well-nourished. He is not ill-appearing, toxic-appearing or diaphoretic.  HENT:     Head: Normocephalic.     Comments: Possible tiny contusion right temporal region.  No associated bleeding, drainage or crepitation.    Right Ear: External ear normal.     Left Ear: External ear normal.     Mouth/Throat:     Mouth: Mucous membranes are moist.  Eyes:     Extraocular Movements: EOM normal.     Conjunctiva/sclera: Conjunctivae normal.     Pupils: Pupils are equal, round, and reactive to light.  Neck:     Trachea: Phonation normal.  Cardiovascular:     Rate and Rhythm: Normal rate and regular rhythm.     Heart sounds: Normal heart sounds.  Pulmonary:     Effort: Pulmonary effort is normal.     Breath sounds: Normal breath sounds.  Chest:     Chest wall: Tenderness (Right  lateral, diffuse without crepitation or deformity.  No overlying bruising) present. No bony tenderness.  Abdominal:     General: There is no distension.     Palpations: Abdomen is soft.     Tenderness: There is no abdominal tenderness.  Musculoskeletal:     Cervical back: Normal range of motion and neck supple.     Comments: Mild tenderness, right upper arm, without swelling or deformity.  Skin:    General: Skin is warm, dry and intact.  Neurological:     Mental Status: He is alert.     Cranial Nerves: No cranial nerve deficit.     Motor: No abnormal muscle tone.     Coordination: Coordination normal.     Comments: No dysarthria or aphasia.  Psychiatric:        Mood and Affect: Mood and affect and mood normal.        Behavior: Behavior normal.     ED Results / Procedures / Treatments   Labs (all labs ordered are listed, but only abnormal results are displayed) Labs Reviewed - No data to display  EKG None  Radiology No results found.  Procedures Procedures (including critical care time)  Medications Ordered in ED Medications - No data to display  ED Course  I have reviewed the triage vital signs and the nursing notes.  Pertinent labs & imaging results that were available during my care of the patient were reviewed by me and considered in my medical decision making (see chart for details).    MDM Rules/Calculators/A&P                           Patient Vitals for the past 24 hrs:  BP Temp Temp src Pulse Resp SpO2 Height Weight  06/25/20 2053 -- -- -- -- -- -- 6\' 2"  (1.88 m) 74 kg  06/25/20 2051 104/75 98.6 F (37 C) Oral 77 17 96 % -- --    10:51 PM Reevaluation with update and discussion. After initial assessment and treatment, an updated evaluation reveals patient remains comfortable.  I cleaned and abrasion of his right hand, it is very superficial and less than 0.5 cm.  No active bleeding or deformity associated with it.  I have talked to Glennie Hawk, DSS  case worker, at [phone].  I informed her that he has been evaluated and stable for discharge.  She expressed no further questions or concerns. Daleen Bo   Medical Decision Making:  This  patient is presenting for evaluation of injuries from fall, which does require a range of treatment options, and is a complaint that involves a moderate risk of morbidity and mortality. The differential diagnoses include contusion, fracture, acute medical illness. I decided to review old records, and in summary elderly male, unable to history, presenting for witnessed fall..  I did not obtain additional historical information from anyone.  Clinical Laboratory Tests Ordered, included CBC and Metabolic panel. Review indicates normal except creatinine high, calcium low, GFR low, glucose high, BUN high, hemoglobin low. Radiologic Tests Ordered, included CT head and cervical spine, radiographs of pelvis and chest and right humerus.  I independently Visualized: Radiographic images, which show no acute injuries   Critical Interventions-clinical evaluation, laboratory testing, radiographic imaging, observation reassessment  After These Interventions, the Patient was reevaluated and was found stable for discharge.  Patient with contusions, and stable clinical exam.  He is alert and does not show signs of head injury.  No indication for hospitalization or further treatment here.  I discussed things with his legal guardian, by telephone.  CRITICAL CARE-no Performed by: Daleen Bo  Nursing Notes Reviewed/ Care Coordinated Applicable Imaging Reviewed Interpretation of Laboratory Data incorporated into ED treatment  The patient appears reasonably screened and/or stabilized for discharge and I doubt any other medical condition or other [facility] requiring further screening, evaluation, or treatment in the ED at this time prior to discharge.  Plan: Home Medications-continue usual; Home Treatments-ice if needed for  pain; return here if the recommended treatment, does not improve the symptoms; Recommended follow up-PCP, as needed     Final Clinical Impression(s) / ED Diagnoses Final diagnoses:  Fall, initial encounter  Contusion, multiple sites  Abrasion of right hand, initial encounter  Spondylosis of cervical region without myelopathy or radiculopathy    Rx / DC Orders ED Discharge Orders    None       Daleen Bo, MD 06/26/20 1600

## 2020-06-25 NOTE — ED Triage Notes (Signed)
Pt arrives EMS from [facility] C/o fall from standing position onto right side. Pt c/o pain to right arm, rib, right hips, right side of head. Pt presents with right sided weakness unknown if he has history of same. Pt takes xarelto.

## 2020-06-25 NOTE — ED Notes (Signed)
[name] with [facility] services advised she was the PTs legal guardian and requested updated pt condition report. Pt plan of care and current condition given. [name] [phone]

## 2020-06-29 DIAGNOSIS — G301 Alzheimer's disease with late onset: Secondary | ICD-10-CM | POA: Diagnosis not present

## 2020-06-29 DIAGNOSIS — E785 Hyperlipidemia, unspecified: Secondary | ICD-10-CM | POA: Diagnosis not present

## 2020-06-29 DIAGNOSIS — I951 Orthostatic hypotension: Secondary | ICD-10-CM | POA: Diagnosis not present

## 2020-06-29 DIAGNOSIS — U071 COVID-19: Secondary | ICD-10-CM | POA: Diagnosis not present

## 2020-06-29 DIAGNOSIS — I480 Paroxysmal atrial fibrillation: Secondary | ICD-10-CM | POA: Diagnosis not present

## 2020-06-29 DIAGNOSIS — Z20828 Contact with and (suspected) exposure to other viral communicable diseases: Secondary | ICD-10-CM | POA: Diagnosis not present

## 2020-07-01 DIAGNOSIS — Z20828 Contact with and (suspected) exposure to other viral communicable diseases: Secondary | ICD-10-CM | POA: Diagnosis not present

## 2020-07-01 DIAGNOSIS — U071 COVID-19: Secondary | ICD-10-CM | POA: Diagnosis not present

## 2020-07-06 DIAGNOSIS — U071 COVID-19: Secondary | ICD-10-CM | POA: Diagnosis not present

## 2020-07-06 DIAGNOSIS — Z20828 Contact with and (suspected) exposure to other viral communicable diseases: Secondary | ICD-10-CM | POA: Diagnosis not present

## 2020-07-08 DIAGNOSIS — Z20828 Contact with and (suspected) exposure to other viral communicable diseases: Secondary | ICD-10-CM | POA: Diagnosis not present

## 2020-07-08 DIAGNOSIS — U071 COVID-19: Secondary | ICD-10-CM | POA: Diagnosis not present

## 2020-07-13 DIAGNOSIS — Z20828 Contact with and (suspected) exposure to other viral communicable diseases: Secondary | ICD-10-CM | POA: Diagnosis not present

## 2020-07-13 DIAGNOSIS — U071 COVID-19: Secondary | ICD-10-CM | POA: Diagnosis not present

## 2020-07-15 DIAGNOSIS — Z20828 Contact with and (suspected) exposure to other viral communicable diseases: Secondary | ICD-10-CM | POA: Diagnosis not present

## 2020-07-15 DIAGNOSIS — U071 COVID-19: Secondary | ICD-10-CM | POA: Diagnosis not present

## 2020-07-20 DIAGNOSIS — U071 COVID-19: Secondary | ICD-10-CM | POA: Diagnosis not present

## 2020-07-20 DIAGNOSIS — Z20828 Contact with and (suspected) exposure to other viral communicable diseases: Secondary | ICD-10-CM | POA: Diagnosis not present

## 2020-07-22 DIAGNOSIS — U071 COVID-19: Secondary | ICD-10-CM | POA: Diagnosis not present

## 2020-07-22 DIAGNOSIS — Z20828 Contact with and (suspected) exposure to other viral communicable diseases: Secondary | ICD-10-CM | POA: Diagnosis not present

## 2020-07-27 DIAGNOSIS — Z20828 Contact with and (suspected) exposure to other viral communicable diseases: Secondary | ICD-10-CM | POA: Diagnosis not present

## 2020-07-27 DIAGNOSIS — U071 COVID-19: Secondary | ICD-10-CM | POA: Diagnosis not present

## 2020-07-29 DIAGNOSIS — Z20828 Contact with and (suspected) exposure to other viral communicable diseases: Secondary | ICD-10-CM | POA: Diagnosis not present

## 2020-07-29 DIAGNOSIS — U071 COVID-19: Secondary | ICD-10-CM | POA: Diagnosis not present

## 2020-08-02 DIAGNOSIS — E785 Hyperlipidemia, unspecified: Secondary | ICD-10-CM | POA: Diagnosis not present

## 2020-08-02 DIAGNOSIS — I480 Paroxysmal atrial fibrillation: Secondary | ICD-10-CM | POA: Diagnosis not present

## 2020-08-03 DIAGNOSIS — Z20828 Contact with and (suspected) exposure to other viral communicable diseases: Secondary | ICD-10-CM | POA: Diagnosis not present

## 2020-08-03 DIAGNOSIS — F0391 Unspecified dementia with behavioral disturbance: Secondary | ICD-10-CM | POA: Diagnosis not present

## 2020-08-03 DIAGNOSIS — U071 COVID-19: Secondary | ICD-10-CM | POA: Diagnosis not present

## 2020-08-03 DIAGNOSIS — R32 Unspecified urinary incontinence: Secondary | ICD-10-CM | POA: Diagnosis not present

## 2020-08-04 DIAGNOSIS — H353131 Nonexudative age-related macular degeneration, bilateral, early dry stage: Secondary | ICD-10-CM | POA: Diagnosis not present

## 2020-08-05 DIAGNOSIS — Z20828 Contact with and (suspected) exposure to other viral communicable diseases: Secondary | ICD-10-CM | POA: Diagnosis not present

## 2020-08-05 DIAGNOSIS — U071 COVID-19: Secondary | ICD-10-CM | POA: Diagnosis not present

## 2020-08-10 DIAGNOSIS — U071 COVID-19: Secondary | ICD-10-CM | POA: Diagnosis not present

## 2020-08-10 DIAGNOSIS — Z20828 Contact with and (suspected) exposure to other viral communicable diseases: Secondary | ICD-10-CM | POA: Diagnosis not present

## 2020-08-12 DIAGNOSIS — U071 COVID-19: Secondary | ICD-10-CM | POA: Diagnosis not present

## 2020-08-12 DIAGNOSIS — Z20828 Contact with and (suspected) exposure to other viral communicable diseases: Secondary | ICD-10-CM | POA: Diagnosis not present

## 2020-08-17 DIAGNOSIS — U071 COVID-19: Secondary | ICD-10-CM | POA: Diagnosis not present

## 2020-08-17 DIAGNOSIS — Z20828 Contact with and (suspected) exposure to other viral communicable diseases: Secondary | ICD-10-CM | POA: Diagnosis not present

## 2020-08-19 DIAGNOSIS — U071 COVID-19: Secondary | ICD-10-CM | POA: Diagnosis not present

## 2020-08-19 DIAGNOSIS — Z20828 Contact with and (suspected) exposure to other viral communicable diseases: Secondary | ICD-10-CM | POA: Diagnosis not present

## 2020-08-24 DIAGNOSIS — Z20828 Contact with and (suspected) exposure to other viral communicable diseases: Secondary | ICD-10-CM | POA: Diagnosis not present

## 2020-08-24 DIAGNOSIS — U071 COVID-19: Secondary | ICD-10-CM | POA: Diagnosis not present

## 2020-08-26 DIAGNOSIS — U071 COVID-19: Secondary | ICD-10-CM | POA: Diagnosis not present

## 2020-08-26 DIAGNOSIS — Z20828 Contact with and (suspected) exposure to other viral communicable diseases: Secondary | ICD-10-CM | POA: Diagnosis not present

## 2020-08-30 DIAGNOSIS — R32 Unspecified urinary incontinence: Secondary | ICD-10-CM | POA: Diagnosis not present

## 2020-08-30 DIAGNOSIS — F0391 Unspecified dementia with behavioral disturbance: Secondary | ICD-10-CM | POA: Diagnosis not present

## 2020-08-31 DIAGNOSIS — Z20828 Contact with and (suspected) exposure to other viral communicable diseases: Secondary | ICD-10-CM | POA: Diagnosis not present

## 2020-08-31 DIAGNOSIS — U071 COVID-19: Secondary | ICD-10-CM | POA: Diagnosis not present

## 2020-09-02 DIAGNOSIS — Z20828 Contact with and (suspected) exposure to other viral communicable diseases: Secondary | ICD-10-CM | POA: Diagnosis not present

## 2020-09-02 DIAGNOSIS — I480 Paroxysmal atrial fibrillation: Secondary | ICD-10-CM | POA: Diagnosis not present

## 2020-09-02 DIAGNOSIS — G301 Alzheimer's disease with late onset: Secondary | ICD-10-CM | POA: Diagnosis not present

## 2020-09-02 DIAGNOSIS — U071 COVID-19: Secondary | ICD-10-CM | POA: Diagnosis not present

## 2020-09-07 DIAGNOSIS — Z20828 Contact with and (suspected) exposure to other viral communicable diseases: Secondary | ICD-10-CM | POA: Diagnosis not present

## 2020-09-07 DIAGNOSIS — U071 COVID-19: Secondary | ICD-10-CM | POA: Diagnosis not present

## 2020-09-09 DIAGNOSIS — U071 COVID-19: Secondary | ICD-10-CM | POA: Diagnosis not present

## 2020-09-09 DIAGNOSIS — Z20828 Contact with and (suspected) exposure to other viral communicable diseases: Secondary | ICD-10-CM | POA: Diagnosis not present

## 2020-09-14 ENCOUNTER — Other Ambulatory Visit: Payer: Self-pay

## 2020-09-14 ENCOUNTER — Ambulatory Visit (INDEPENDENT_AMBULATORY_CARE_PROVIDER_SITE_OTHER): Payer: Medicare HMO | Admitting: Cardiology

## 2020-09-14 ENCOUNTER — Encounter: Payer: Self-pay | Admitting: Cardiology

## 2020-09-14 VITALS — BP 116/60 | HR 70 | Ht 68.0 in | Wt 166.6 lb

## 2020-09-14 DIAGNOSIS — I9589 Other hypotension: Secondary | ICD-10-CM | POA: Diagnosis not present

## 2020-09-14 DIAGNOSIS — I4821 Permanent atrial fibrillation: Secondary | ICD-10-CM

## 2020-09-14 DIAGNOSIS — U071 COVID-19: Secondary | ICD-10-CM | POA: Diagnosis not present

## 2020-09-14 DIAGNOSIS — Z20828 Contact with and (suspected) exposure to other viral communicable diseases: Secondary | ICD-10-CM | POA: Diagnosis not present

## 2020-09-14 DIAGNOSIS — I5022 Chronic systolic (congestive) heart failure: Secondary | ICD-10-CM

## 2020-09-14 DIAGNOSIS — Z7689 Persons encountering health services in other specified circumstances: Secondary | ICD-10-CM | POA: Diagnosis not present

## 2020-09-14 NOTE — Patient Instructions (Signed)

## 2020-09-14 NOTE — Progress Notes (Signed)
Clinical Summary Mr. Volland is a 87 y.o.male  1. Permanent Afib - has not required av nodal agents - on xarelto - CrCl calculated at 46 today, should be on xarelto 15mg  daily.   - no recent palpitations - no bleeding on xarelto.    2. Chronic systolic HF - 09/5007 echo LVEF 40-45% - has not had ischemic evaluation due to dementia, advanced age and comorbidities - medical therapy limited, and essentially prohibited by his significant low bp's  -no recent SOB/DOE   3. Chronic hypotension - has been on midodrine - occasional lightheadedness or dizziness, infrequent.   - no recent symptoms.   4. Dementia  5.  Prior CVA - CVA in 09/2018 Past Medical History:  Diagnosis Date  . [name] deficiency 2011  . Bladder cancer ([place])   . CAD (coronary artery disease)   . Dementia ([place])   . Hx of colonic polyp   . Hyperlipidemia   . Memory loss 2011     No Known Allergies   Current Outpatient Medications  Medication Sig Dispense Refill  . acetaminophen (TYLENOL) 500 MG tablet Take 500 mg by mouth every 6 (six) hours as needed for mild pain, fever or headache.    Marland Kitchen atorvastatin (LIPITOR) 20 MG tablet Take 1 tablet (20 mg total) by mouth daily at 6 PM.    . furosemide (LASIX) 20 MG tablet Take 20 mg by mouth daily. X 2 weeks    . midodrine (PROAMATINE) 5 MG tablet Take 5 mg by mouth 2 (two) times daily with a meal.    . Polyethylene Glycol 3350 (MIRALAX PO) Take by mouth. Take as needed for constipation    . Rivaroxaban (XARELTO) 15 MG TABS tablet Take 15 mg by mouth daily with supper.     No current facility-administered medications for this visit.     Past Surgical History:  Procedure Laterality Date  . CHOLECYSTECTOMY       No Known Allergies    Family History  Problem Relation Age of Onset  . Coronary artery disease Other   . Dementia Other      Social History Mr. Mayol reports that he quit smoking about 46 years ago. He has never used  smokeless tobacco. Mr. Mcmanus reports no history of alcohol use.   Review of Systems CONSTITUTIONAL: No weight loss, fever, chills, weakness or fatigue.  HEENT: Eyes: No visual loss, blurred vision, double vision or yellow sclerae.No hearing loss, sneezing, congestion, runny nose or sore throat.  SKIN: No rash or itching.  CARDIOVASCULAR: per hpi RESPIRATORY: No shortness of breath, cough or sputum.  GASTROINTESTINAL: No anorexia, nausea, vomiting or diarrhea. No abdominal pain or blood.  GENITOURINARY: No burning on urination, no polyuria NEUROLOGICAL: No headache, dizziness, syncope, paralysis, ataxia, numbness or tingling in the extremities. No change in bowel or bladder control.  MUSCULOSKELETAL: No muscle, back pain, joint pain or stiffness.  LYMPHATICS: No enlarged nodes. No history of splenectomy.  PSYCHIATRIC: No history of depression or anxiety.  ENDOCRINOLOGIC: No reports of sweating, cold or heat intolerance. No polyuria or polydipsia.  Marland Kitchen   Physical Examination Today's Vitals   09/14/20 1338  BP: 116/60  Pulse: 70  SpO2: 96%  Weight: 166 lb 9.6 oz (75.6 kg)  Height: 5\' 8"  (1.727 m)   Body mass index is 25.33 kg/m.  Gen: resting comfortably, no acute distress HEENT: no scleral icterus, pupils equal round and reactive, no palptable cervical adenopathy,  CV: RRR, no m/r/g no jvd  Resp: Clear to auscultation bilaterally GI: abdomen is soft, non-tender, non-distended, normal bowel sounds, no hepatosplenomegaly MSK: extremities are warm, 1-2+ bilateral LE edema.  Skin: warm, no rash Neuro:  no focal deficits Psych: appropriate affect   Diagnostic Studies Echo 09/10/18:  1. The left ventricle has mild-moderately reduced systolic function, with an ejection fraction of 40-45%. The cavity size was normal. There is mildly increased left ventricular wall thickness. Left ventricular diastolic Doppler parameters are  indeterminate. 2. Hypokinesis of the apical, mid  to apical anteroseptal, mid-apical inferoseptal, apical anterior, and apical anteroseptal myocardium. 3. The right ventricle has normal systolic function. The cavity was normal. There is no increase in right ventricular wall thickness. 4. Left atrial size was mildly dilated. 5. Right atrial size was severely dilated. 6. The mitral valve is normal in structure. 7. The tricuspid valve is normal in structure. 8. The aortic valve is tricuspid Aortic valve regurgitation is mild by color flow Doppler. 9. The pulmonic valve was normal in structure. Pulmonic valve regurgitation is mild by color flow Doppler. 10. Large patent foramen ovale. 11. Cannot rule out PFO by color flow Doppler.    Assessment and Plan  1. Permanent afib - not requiring av nodal agents. Asymptomatic - no symptoms, continue current meds  2. Chronic systolic - no SOB or DOE. Some LE edema, given prior issues with hypotension and dizziness in the absence of cardiopulmonary symptoms would avoid diuretic.   3. Hypotension - on midodrine,no recent symptoms. Continue current regimen        Arnoldo Lenis, M.D.

## 2020-09-21 DIAGNOSIS — Z20828 Contact with and (suspected) exposure to other viral communicable diseases: Secondary | ICD-10-CM | POA: Diagnosis not present

## 2020-09-21 DIAGNOSIS — U071 COVID-19: Secondary | ICD-10-CM | POA: Diagnosis not present

## 2020-09-22 DIAGNOSIS — G301 Alzheimer's disease with late onset: Secondary | ICD-10-CM | POA: Diagnosis not present

## 2020-09-22 DIAGNOSIS — Z0001 Encounter for general adult medical examination with abnormal findings: Secondary | ICD-10-CM | POA: Diagnosis not present

## 2020-09-22 DIAGNOSIS — N182 Chronic kidney disease, stage 2 (mild): Secondary | ICD-10-CM | POA: Diagnosis not present

## 2020-09-22 DIAGNOSIS — I480 Paroxysmal atrial fibrillation: Secondary | ICD-10-CM | POA: Diagnosis not present

## 2020-09-22 DIAGNOSIS — Z7689 Persons encountering health services in other specified circumstances: Secondary | ICD-10-CM | POA: Diagnosis not present

## 2020-09-22 DIAGNOSIS — Z1331 Encounter for screening for depression: Secondary | ICD-10-CM | POA: Diagnosis not present

## 2020-09-22 DIAGNOSIS — Z1389 Encounter for screening for other disorder: Secondary | ICD-10-CM | POA: Diagnosis not present

## 2020-09-28 DIAGNOSIS — Z20828 Contact with and (suspected) exposure to other viral communicable diseases: Secondary | ICD-10-CM | POA: Diagnosis not present

## 2020-09-28 DIAGNOSIS — U071 COVID-19: Secondary | ICD-10-CM | POA: Diagnosis not present

## 2020-09-30 DIAGNOSIS — R32 Unspecified urinary incontinence: Secondary | ICD-10-CM | POA: Diagnosis not present

## 2020-09-30 DIAGNOSIS — F0391 Unspecified dementia with behavioral disturbance: Secondary | ICD-10-CM | POA: Diagnosis not present

## 2020-10-06 DIAGNOSIS — Z79899 Other long term (current) drug therapy: Secondary | ICD-10-CM | POA: Diagnosis not present

## 2020-10-06 DIAGNOSIS — Z0001 Encounter for general adult medical examination with abnormal findings: Secondary | ICD-10-CM | POA: Diagnosis not present

## 2020-10-06 DIAGNOSIS — E785 Hyperlipidemia, unspecified: Secondary | ICD-10-CM | POA: Diagnosis not present

## 2020-10-06 DIAGNOSIS — N182 Chronic kidney disease, stage 2 (mild): Secondary | ICD-10-CM | POA: Diagnosis not present

## 2020-10-07 DIAGNOSIS — U071 COVID-19: Secondary | ICD-10-CM | POA: Diagnosis not present

## 2020-10-07 DIAGNOSIS — Z20828 Contact with and (suspected) exposure to other viral communicable diseases: Secondary | ICD-10-CM | POA: Diagnosis not present

## 2020-10-12 DIAGNOSIS — U071 COVID-19: Secondary | ICD-10-CM | POA: Diagnosis not present

## 2020-10-12 DIAGNOSIS — Z20828 Contact with and (suspected) exposure to other viral communicable diseases: Secondary | ICD-10-CM | POA: Diagnosis not present

## 2020-10-19 DIAGNOSIS — U071 COVID-19: Secondary | ICD-10-CM | POA: Diagnosis not present

## 2020-10-19 DIAGNOSIS — Z20828 Contact with and (suspected) exposure to other viral communicable diseases: Secondary | ICD-10-CM | POA: Diagnosis not present

## 2020-10-23 DIAGNOSIS — G301 Alzheimer's disease with late onset: Secondary | ICD-10-CM | POA: Diagnosis not present

## 2020-10-23 DIAGNOSIS — E785 Hyperlipidemia, unspecified: Secondary | ICD-10-CM | POA: Diagnosis not present

## 2020-10-26 DIAGNOSIS — U071 COVID-19: Secondary | ICD-10-CM | POA: Diagnosis not present

## 2020-10-26 DIAGNOSIS — Z20828 Contact with and (suspected) exposure to other viral communicable diseases: Secondary | ICD-10-CM | POA: Diagnosis not present

## 2020-10-29 DIAGNOSIS — R32 Unspecified urinary incontinence: Secondary | ICD-10-CM | POA: Diagnosis not present

## 2020-10-29 DIAGNOSIS — F0391 Unspecified dementia with behavioral disturbance: Secondary | ICD-10-CM | POA: Diagnosis not present

## 2020-11-01 ENCOUNTER — Observation Stay (HOSPITAL_COMMUNITY)
Admission: EM | Admit: 2020-11-01 | Discharge: 2020-11-03 | Disposition: A | Discharge status: Home / Self Care | Payer: Medicare HMO | Attending: Family Medicine | Admitting: Family Medicine

## 2020-11-01 ENCOUNTER — Encounter (HOSPITAL_COMMUNITY): Payer: Self-pay | Admitting: *Deleted

## 2020-11-01 ENCOUNTER — Emergency Department (HOSPITAL_COMMUNITY): Payer: Medicare HMO

## 2020-11-01 ENCOUNTER — Other Ambulatory Visit: Payer: Self-pay

## 2020-11-01 DIAGNOSIS — I2510 Atherosclerotic heart disease of native coronary artery without angina pectoris: Secondary | ICD-10-CM | POA: Insufficient documentation

## 2020-11-01 DIAGNOSIS — S2232XA Fracture of one rib, left side, initial encounter for closed fracture: Secondary | ICD-10-CM | POA: Diagnosis not present

## 2020-11-01 DIAGNOSIS — E782 Mixed hyperlipidemia: Secondary | ICD-10-CM | POA: Diagnosis not present

## 2020-11-01 DIAGNOSIS — M549 Dorsalgia, unspecified: Secondary | ICD-10-CM | POA: Diagnosis not present

## 2020-11-01 DIAGNOSIS — Z87891 Personal history of nicotine dependence: Secondary | ICD-10-CM | POA: Diagnosis not present

## 2020-11-01 DIAGNOSIS — I4891 Unspecified atrial fibrillation: Secondary | ICD-10-CM | POA: Diagnosis present

## 2020-11-01 DIAGNOSIS — W19XXXA Unspecified fall, initial encounter: Secondary | ICD-10-CM | POA: Diagnosis present

## 2020-11-01 DIAGNOSIS — Y9341 Activity, dancing: Secondary | ICD-10-CM | POA: Insufficient documentation

## 2020-11-01 DIAGNOSIS — R55 Syncope and collapse: Secondary | ICD-10-CM

## 2020-11-01 DIAGNOSIS — Z20822 Contact with and (suspected) exposure to covid-19: Secondary | ICD-10-CM | POA: Insufficient documentation

## 2020-11-01 DIAGNOSIS — M25552 Pain in left hip: Secondary | ICD-10-CM | POA: Insufficient documentation

## 2020-11-01 DIAGNOSIS — S0003XA Contusion of scalp, initial encounter: Secondary | ICD-10-CM | POA: Diagnosis not present

## 2020-11-01 DIAGNOSIS — M25512 Pain in left shoulder: Secondary | ICD-10-CM | POA: Insufficient documentation

## 2020-11-01 DIAGNOSIS — Z79899 Other long term (current) drug therapy: Secondary | ICD-10-CM | POA: Insufficient documentation

## 2020-11-01 DIAGNOSIS — W1839XA Other fall on same level, initial encounter: Secondary | ICD-10-CM | POA: Insufficient documentation

## 2020-11-01 DIAGNOSIS — I700 Atherosclerosis of aorta: Secondary | ICD-10-CM | POA: Diagnosis not present

## 2020-11-01 DIAGNOSIS — S199XXA Unspecified injury of neck, initial encounter: Secondary | ICD-10-CM | POA: Diagnosis not present

## 2020-11-01 DIAGNOSIS — D539 Nutritional anemia, unspecified: Secondary | ICD-10-CM | POA: Diagnosis not present

## 2020-11-01 DIAGNOSIS — M47812 Spondylosis without myelopathy or radiculopathy, cervical region: Secondary | ICD-10-CM | POA: Diagnosis not present

## 2020-11-01 DIAGNOSIS — F0390 Unspecified dementia without behavioral disturbance: Secondary | ICD-10-CM | POA: Diagnosis not present

## 2020-11-01 DIAGNOSIS — Z7901 Long term (current) use of anticoagulants: Secondary | ICD-10-CM | POA: Insufficient documentation

## 2020-11-01 DIAGNOSIS — M4319 Spondylolisthesis, multiple sites in spine: Secondary | ICD-10-CM | POA: Diagnosis not present

## 2020-11-01 DIAGNOSIS — I482 Chronic atrial fibrillation, unspecified: Secondary | ICD-10-CM | POA: Diagnosis not present

## 2020-11-01 DIAGNOSIS — M545 Low back pain, unspecified: Secondary | ICD-10-CM | POA: Diagnosis not present

## 2020-11-01 DIAGNOSIS — S299XXA Unspecified injury of thorax, initial encounter: Secondary | ICD-10-CM | POA: Diagnosis present

## 2020-11-01 DIAGNOSIS — E785 Hyperlipidemia, unspecified: Secondary | ICD-10-CM | POA: Diagnosis present

## 2020-11-01 DIAGNOSIS — S2242XA Multiple fractures of ribs, left side, initial encounter for closed fracture: Secondary | ICD-10-CM | POA: Diagnosis not present

## 2020-11-01 DIAGNOSIS — M533 Sacrococcygeal disorders, not elsewhere classified: Secondary | ICD-10-CM | POA: Diagnosis not present

## 2020-11-01 HISTORY — DX: Cerebral infarction, unspecified: I63.9

## 2020-11-01 HISTORY — DX: Unspecified atrial fibrillation: I48.91

## 2020-11-01 LAB — COMPREHENSIVE METABOLIC PANEL
ALT: 10 U/L (ref 0–44)
AST: 18 U/L (ref 15–41)
Albumin: 3.5 g/dL (ref 3.5–5.0)
Alkaline Phosphatase: 82 U/L (ref 38–126)
Anion gap: 6 (ref 5–15)
BUN: 29 mg/dL — ABNORMAL HIGH (ref 8–23)
CO2: 26 mmol/L (ref 22–32)
Calcium: 8.7 mg/dL — ABNORMAL LOW (ref 8.9–10.3)
Chloride: 102 mmol/L (ref 98–111)
Creatinine, Ser: 1.24 mg/dL (ref 0.61–1.24)
GFR, Estimated: 56 mL/min — ABNORMAL LOW (ref >60)
Glucose, Bld: 99 mg/dL (ref 70–99)
Potassium: 4.1 mmol/L (ref 3.5–5.1)
Sodium: 134 mmol/L — ABNORMAL LOW (ref 135–145)
Total Bilirubin: 0.7 mg/dL (ref 0.3–1.2)
Total Protein: 6.3 g/dL — ABNORMAL LOW (ref 6.5–8.1)

## 2020-11-01 LAB — URINALYSIS, ROUTINE W REFLEX MICROSCOPIC
Bilirubin Urine: NEGATIVE
Glucose, UA: NEGATIVE mg/dL
Hgb urine dipstick: NEGATIVE
Ketones, ur: NEGATIVE mg/dL
Leukocytes,Ua: NEGATIVE
Nitrite: NEGATIVE
Protein, ur: NEGATIVE mg/dL
Specific Gravity, Urine: 1.013 (ref 1.005–1.030)
pH: 7 (ref 5.0–8.0)

## 2020-11-01 LAB — CBC WITH DIFFERENTIAL/PLATELET
Abs Immature Granulocytes: 0.01 10*3/uL (ref 0.00–0.07)
Basophils Absolute: 0.1 10*3/uL (ref 0.0–0.1)
Basophils Relative: 1 %
Eosinophils Absolute: 0.1 10*3/uL (ref 0.0–0.5)
Eosinophils Relative: 2 %
HCT: 38 % — ABNORMAL LOW (ref 39.0–52.0)
Hemoglobin: 12.4 g/dL — ABNORMAL LOW (ref 13.0–17.0)
Immature Granulocytes: 0 %
Lymphocytes Relative: 19 %
Lymphs Abs: 1.1 10*3/uL (ref 0.7–4.0)
MCH: 32.7 pg (ref 26.0–34.0)
MCHC: 32.6 g/dL (ref 30.0–36.0)
MCV: 100.3 fL — ABNORMAL HIGH (ref 80.0–100.0)
Monocytes Absolute: 0.6 10*3/uL (ref 0.1–1.0)
Monocytes Relative: 10 %
Neutro Abs: 3.9 10*3/uL (ref 1.7–7.7)
Neutrophils Relative %: 68 %
Platelets: 169 10*3/uL (ref 150–400)
RBC: 3.79 MIL/uL — ABNORMAL LOW (ref 4.22–5.81)
RDW: 13 % (ref 11.5–15.5)
WBC: 5.8 10*3/uL (ref 4.0–10.5)
nRBC: 0 % (ref 0.0–0.2)

## 2020-11-01 LAB — TROPONIN I (HIGH SENSITIVITY)
Troponin I (High Sensitivity): 7 ng/L (ref <18)
Troponin I (High Sensitivity): 8 ng/L (ref <18)

## 2020-11-01 NOTE — ED Provider Notes (Signed)
[name]   CSN: [id number] Arrival date & time: 11/01/20  1550     History Chief Complaint  Patient presents with  . [name] is a 87 y.o. male.  HPI      Level 5 caveat due to dementia.  [name] is a 87 y.o. male, with a history of bladder cancer, CAD, cerebral infarction, dementia, hyperlipidemia, A. fib, presenting to the ED from a nursing facility with injuries from reported fall.  There is some disparity between what EMS reports from the facility and what the patient is reporting.  EMS reports patient was dancing and fell, injuring the left shoulder and left ribs. Patient states he was sitting at rest and began to feel pain in the left shoulder and then passed out.  However, patient also states he did not fall or wake up on the floor.  He does endorse some intermittent left shoulder pain as well as left hip pain.  Patient is anticoagulated on Xarelto.  Patient denies current chest pain, back pain, neck pain, dizziness, nausea/vomiting, recent illness, abdominal pain, shortness of breath, numbness, weakness, or any other complaints.   Past Medical History:  Diagnosis Date  . [name] deficiency 2011  . Bladder cancer ([place])    Malignet Neoplasm  . CAD (coronary artery disease)   . Cerebral infarction ([place])   . Dementia ([place])   . Hx of colonic polyp   . Hyperlipidemia   . Memory loss 2011  . Unspecified atrial fibrillation [facility])     Patient Active Problem List   Diagnosis Date Noted  . Fall 11/01/2020  . Hygroma 09/10/2018  . Stroke (cerebrum) ([place]) left brain infarct status post TPA 09/09/2018  . Chest pain 12/29/2013  . Atrial fibrillation ([place]) 12/29/2013  . VERTIGO 02/04/2010  . [name] DEFICIENCY 01/31/2010  . Memory loss 01/27/2010  . PARESTHESIA 01/27/2010  . Hyperlipidemia 11/02/2009  . CORONARY ARTERY DISEASE 11/02/2009  . SINUSITIS, ACUTE 11/02/2009  . EPISTAXIS 11/02/2009  . COLONIC  POLYPS, HX OF 11/02/2009    Past Surgical History:  Procedure Laterality Date  . CHOLECYSTECTOMY         Family History  Problem Relation Age of Onset  . Coronary artery disease Other   . Dementia Other     Social History   Tobacco Use  . Smoking status: Former Smoker    Quit date: 12/29/1973    Years since quitting: 46.8  . Smokeless tobacco: Never Used  Vaping Use  . Vaping Use: Never used  Substance Use Topics  . Alcohol use: No  . Drug use: Never    Home Medications Prior to Admission medications   Medication Sig Start Date End Date Taking? Authorizing Provider  acetaminophen (TYLENOL) 500 MG tablet Take 500 mg by mouth every 6 (six) hours as needed for mild pain, fever or headache.    [provider]  atorvastatin (LIPITOR) 20 MG tablet Take 1 tablet (20 mg total) by mouth daily at 6 PM. 09/14/18   Rinehuls, Early Chars, PA-C  loratadine (CLARITIN) 10 MG tablet Take 10 mg by mouth daily.    [provider]  midodrine (PROAMATINE) 5 MG tablet Take 5 mg by mouth 2 (two) times daily with a meal.    [provider]  Polyethylene Glycol 3350 (MIRALAX PO) Take by mouth. Take as needed for constipation    [provider]  Rivaroxaban (XARELTO) 15 MG TABS tablet Take 15 mg  by mouth daily with supper.    [provider]    Allergies    Patient has no known allergies.  Review of Systems   Review of Systems  Unable to perform ROS: Dementia    Physical Exam Updated Vital Signs BP 99/67 (BP Location: Right Arm)   Pulse 68   Temp 98.1 F (36.7 C) (Oral)   Resp 16   Ht 5\' 8"  (1.727 m)   Wt 75.6 kg   SpO2 98%   BMI 25.34 kg/m   Physical Exam Vitals and nursing note reviewed.  Constitutional:      General: He is not in acute distress.    Appearance: He is well-developed. He is not diaphoretic.  HENT:     Head: Normocephalic.     Comments: Small area of left parietal scalp swelling.    Mouth/Throat:     Mouth: Mucous  membranes are moist.     Pharynx: Oropharynx is clear.  Eyes:     Conjunctiva/sclera: Conjunctivae normal.  Cardiovascular:     Rate and Rhythm: Normal rate. Rhythm irregularly irregular.     Pulses: Normal pulses.          Radial pulses are 2+ on the right side and 2+ on the left side.       Posterior tibial pulses are 2+ on the right side and 2+ on the left side.     Heart sounds: Normal heart sounds.     Comments: Tactile temperature in the extremities appropriate and equal bilaterally. Pulmonary:     Effort: Pulmonary effort is normal. No respiratory distress.     Breath sounds: Normal breath sounds.  Abdominal:     Palpations: Abdomen is soft.     Tenderness: There is no abdominal tenderness. There is no guarding.  Musculoskeletal:     Cervical back: Neck supple. No tenderness.     Right lower leg: No edema.     Left lower leg: No edema.     Comments: Patient inconsistently endorses pain to the left lateral shoulder, sometimes with range of motion of the left shoulder and sometimes at rest.  No noted swelling, deformity, point tenderness, instability, color abnormality to the left shoulder or left upper extremity. Full range of motion without noted pain or difficulty in the left shoulder, elbow, and wrist.  Patient endorses intermittent pain to the left lateral and superior hip and iliac crest, this too is inconsistent between repeat exams.  No noted deformity, instability, swelling, bruising.  Denies pain with range of motion of the left hip. The right upper extremity and bilateral lower extremities are otherwise without noted pain, tenderness, pain or difficulty with range of motion in each of the major joints, deformity, or instability.  No midline spinal tenderness.   Skin:    General: Skin is warm and dry.     Capillary Refill: Capillary refill takes less than 2 seconds.  Neurological:     Mental Status: He is alert.     Comments: Patient is oriented to self. Sensation  grossly intact to light touch in the extremities.   Grip strengths equal bilaterally.   Strength 5/5 in all extremities.  Coordination intact.  Cranial nerves III-XII grossly intact.  Handles oral secretions without noted difficulty.  No noted phonation or speech deficit. No facial droop.   Psychiatric:        Mood and Affect: Mood and affect normal.        Speech: Speech normal.  Behavior: Behavior normal.     ED Results / Procedures / Treatments   Labs (all labs ordered are listed, but only abnormal results are displayed) Labs Reviewed  COMPREHENSIVE METABOLIC PANEL - Abnormal; Notable for the following components:      Result Value   Sodium 134 (*)    BUN 29 (*)    Calcium 8.7 (*)    Total Protein 6.3 (*)    GFR, Estimated 56 (*)    All other components within normal limits  CBC WITH DIFFERENTIAL/PLATELET - Abnormal; Notable for the following components:   RBC 3.79 (*)    Hemoglobin 12.4 (*)    HCT 38.0 (*)    MCV 100.3 (*)    All other components within normal limits  URINALYSIS, ROUTINE W REFLEX MICROSCOPIC  TROPONIN I (HIGH SENSITIVITY)  TROPONIN I (HIGH SENSITIVITY)    EKG None  ED ECG REPORT   Date: 11/02/2020  Rate: 93  Rhythm: atrial fibrillation  QRS Axis: right  Intervals: N/A  ST/T Wave abnormalities: indeterminate  Conduction Disutrbances:none  Narrative Interpretation:   Old EKG Reviewed: unchanged  I have personally reviewed the EKG tracing and agree with the computerized printout as noted.  Radiology DG Ribs Unilateral W/Chest Left  Result Date: 11/01/2020 CLINICAL DATA:  Fall today while dancing, left shoulder and rib pain. EXAM: LEFT RIBS AND CHEST - 3+ VIEW COMPARISON:  None. FINDINGS: Nondisplaced anterolateral seventh rib fracture. There is no evidence of pneumothorax or pleural effusion. Both lungs are clear. Upper normal heart size. Aortic atherosclerosis. Mediastinal contours are within normal limits. IMPRESSION: Nondisplaced  anterolateral seventh rib fracture. No acute pulmonary complication. Electronically Signed   By: Keith Rake M.D.   On: 11/01/2020 17:44   CT Head Wo Contrast  Result Date: 11/01/2020 CLINICAL DATA:  Head trauma, minor (Age >= 65y) Patient reports fall while dancing at nursing facility. Patient reports syncope striking left side of head on floor. EXAM: CT HEAD WITHOUT CONTRAST TECHNIQUE: Contiguous axial images were obtained from the base of the skull through the vertex without intravenous contrast. COMPARISON:  Head CT 06/25/2020 FINDINGS: Brain: Stable degree of atrophy and chronic small vessel ischemia from prior exam. No intracranial hemorrhage, mass effect, or midline shift. No hydrocephalus. The basilar cisterns are patent. No evidence of territorial infarct or acute ischemia. No extra-axial or intracranial fluid collection. Vascular: No hyperdense vessel. Skull: No fracture or focal lesion. Sinuses/Orbits: Paranasal sinuses and mastoid air cells are clear. The visualized orbits are unremarkable. Bilateral cataract resection. Other: Small left parietal scalp hematoma. IMPRESSION: 1. Small left parietal scalp hematoma. No acute intracranial abnormality. No skull fracture. 2. Stable atrophy and chronic small vessel ischemia. Electronically Signed   By: Keith Rake M.D.   On: 11/01/2020 17:05   CT Cervical Spine Wo Contrast  Result Date: 11/01/2020 CLINICAL DATA:  Neck trauma (Age >= 65y) Patient reports fall while dancing at nursing facility. Patient reports syncope striking left side of head on floor. EXAM: CT CERVICAL SPINE WITHOUT CONTRAST TECHNIQUE: Multidetector CT imaging of the cervical spine was performed without intravenous contrast. Multiplanar CT image reconstructions were also generated. COMPARISON:  Cervical spine CT 05/26/2020 FINDINGS: Alignment: Stable from prior exam, no traumatic subluxation. Trace anterolisthesis of C2 on C3, retrolisthesis of C3 on C4, and anterolisthesis of  C7 on T1, unchanged. Skull base and vertebrae: No acute fracture. Vertebral body heights are maintained. The dens and skull base are intact. Soft tissues and spinal canal: No prevertebral fluid or swelling. No  visible canal hematoma. Disc levels: Diffuse degenerative disc disease with disc space narrowing and endplate spurring. Scattered facet hypertrophy. No significant change from prior exam Upper chest: No acute or unexpected findings. IMPRESSION: Multilevel degenerative change throughout the cervical spine without acute fracture or subluxation. Electronically Signed   By: Keith Rake M.D.   On: 11/01/2020 17:12   DG Shoulder Left  Result Date: 11/01/2020 CLINICAL DATA:  Fall today while dancing, left shoulder and rib pain. EXAM: LEFT SHOULDER - 2+ VIEW COMPARISON:  None. FINDINGS: There is no evidence of fracture or dislocation. Minor acromioclavicular and glenohumeral degenerative change. Soft tissues are unremarkable. IMPRESSION: No fracture or subluxation of the left shoulder. Electronically Signed   By: Keith Rake M.D.   On: 11/01/2020 17:43   DG Hip Unilat W or Wo Pelvis 2-3 Views Left  Result Date: 11/01/2020 CLINICAL DATA:  Fall today while dancing. EXAM: DG HIP (WITH OR WITHOUT PELVIS) 2-3V LEFT COMPARISON:  None. FINDINGS: The cortical margins of the bony pelvis and left hip are intact. No fracture. Pubic symphysis and sacroiliac joints are congruent. Pubic rami are intact. Minor left hip degenerative change with acetabular spurring. IMPRESSION: Minor left hip osteoarthritis without acute fracture. Electronically Signed   By: Keith Rake M.D.   On: 11/01/2020 17:45    Procedures Procedures   Medications Ordered in ED Medications - No data to display  ED Course  I have reviewed the triage vital signs and the nursing notes.  Pertinent labs & imaging results that were available during my care of the patient were reviewed by me and considered in my medical decision making  (see chart for details).  Clinical Course as of 11/02/20 0023  Mon Nov 01, 2020  2352 Spoke with Dr. Josephine Cables, hospitalist.  Agrees to admit the patient. [SJ]    Clinical Course User Index [SJ] Samuell Knoble, Helane Gunther, PA-C   MDM Rules/Calculators/A&P                          Patient presents following an episode of syncope while seated. I think patient's account of the incident is plausible because all his injuries are on the left side and it does not appear as though he fell onto outstretched hands, rather he seems to have injured the left side of his head, left shoulder, and left ribs. I personally reviewed and interpreted the patient's labs and imaging studies. No acute, significant abnormalities on labs.  If patient did suffer a syncopal episode, he is high risk due to his age and possible lack of preamble.  He would benefit from at least short-term admission for continued monitoring.  Findings and plan of care discussed with attending physician, Milton Ferguson, MD. Dr. Roderic Palau personally evaluated and examined this patient.   Vitals:   11/01/20 1616 11/01/20 1823 11/01/20 2144 11/01/20 2230  BP: 99/67 132/72 (!) 151/82 123/82  Pulse: 68 90 85 83  Resp: 16 16 15 14   Temp: 98.1 F (36.7 C)  (!) 97.5 F (36.4 C)   TempSrc: Oral     SpO2: 98% 99% 95% 97%  Weight:      Height:         Final Clinical Impression(s) / ED Diagnoses Final diagnoses:  Fall, initial encounter  Closed fracture of one rib of left side, initial encounter    Rx / DC Orders ED Discharge Orders    None       Lorayne Bender, PA-C 11/02/20  [id number]    Milton Ferguson, MD 11/02/20 1219

## 2020-11-01 NOTE — ED Notes (Signed)
Patient transported to CT 

## 2020-11-01 NOTE — ED Triage Notes (Signed)
Pt brought in by [facility] from [place] with c/o fall today while dancing. Pt c/o pain to left shoulder and left rib cage area. Denies LOC.

## 2020-11-01 NOTE — H&P (Signed)
History and Physical  [name] [id number] DOB: 05-30-1930 DOA: 11/01/2020  Referring physician: Layla Wright PCP: Adrian Fire, MD  Patient coming from: SNF  Chief Complaint: I passed out   HPI: [name] is a 87 y.o. male with medical history significant for CAD, dementia, hyperlipidemia, atrial fibrillation, bladder cancer who presents to the ED emergency department from local SNF with complain of passing out prior to arrival.  Patient states that he was sitting on a chair and he started to have a left shoulder pain, he states that he slid off the chair and passed out on the floor, he was not sure how long he passed out, but on waking up, he had difficulty in being able to get up, but he eventually was able to call for help.  He denied any bladder or bowel incontinence. Of note, EMS report was completely different from patient's, per report, patient was dancing and fell injuring the left shoulder and left ribs. Patient endorsed some intermittent left shoulder and left hip pain at baseline.  He denies chest pain, shortness of breath, back pain, neck pain, nausea, vomiting or abdominal pain.  ED Course:  In the emergency department, temperature was 97.64F, BP was 151/82, other vital signs were within normal range.  Work-up in the ED showed macrocytic anemia, normal BMP except for sodium 134, BUN 29.  Urinalysis was negative for UTI, troponin x2 was negative. Left ribs and chest x-ray showed nondisplaced anterolateral seventh rib fracture with no acute pulmonary complication. Left shoulder showed no fracture or subluxation of the left shoulder CT head without contrast showed small left parietal scalp hematoma.  No acute intracranial abnormality.  No skull fracture Cervical spine showed multilevel degenerative change throughout the cervical spine without acute fracture or subluxation.  Left hip x-ray showed minor left hip osteoarthritis without acute fracture   Hospitalist was asked to admit patient for further evaluation and management.   Review of Systems: Constitutional: Negative for chills and fever.  HENT: Negative for ear pain and sore throat.   Eyes: Negative for pain and visual disturbance.  Respiratory: Negative for cough, chest tightness and shortness of breath.   Cardiovascular: Negative for chest pain and palpitations.  Gastrointestinal: Negative for abdominal pain and vomiting.  Endocrine: Negative for polyphagia and polyuria.  Genitourinary: Negative for decreased urine volume, dysuria Musculoskeletal: Negative for arthralgias and back pain.  Skin: Negative for color change and rash.  Allergic/Immunologic: Negative for immunocompromised state.  Neurological: Negative for tremors, syncope, speech difficulty Hematological: Does not bruise/bleed easily.  All other systems reviewed and are negative   Past Medical History:  Diagnosis Date  . [name] deficiency 2011  . Bladder cancer ([place])    Malignet Neoplasm  . CAD (coronary artery disease)   . Cerebral infarction ([place])   . Dementia ([place])   . Hx of colonic polyp   . Hyperlipidemia   . Memory loss 2011  . Unspecified atrial fibrillation [facility])    Past Surgical History:  Procedure Laterality Date  . CHOLECYSTECTOMY      Social History:  reports that he quit smoking about 46 years ago. He has never used smokeless tobacco. He reports that he does not drink alcohol and does not use drugs.   No Known Allergies  Family History  Problem Relation Age of Onset  . Coronary artery disease Other   . Dementia Other      Prior to Admission medications   Medication Sig Start Date End Date  Taking? Authorizing Provider  acetaminophen (TYLENOL) 500 MG tablet Take 500 mg by mouth every 6 (six) hours as needed for mild pain, fever or headache.    [provider]  atorvastatin (LIPITOR) 20 MG tablet Take 1 tablet (20 mg total) by mouth daily at 6 PM. 09/14/18   Rinehuls, Early Chars, PA-C  loratadine (CLARITIN) 10 MG tablet Take 10 mg by mouth daily.    [provider]  midodrine (PROAMATINE) 5 MG tablet Take 5 mg by mouth 2 (two) times daily with a meal.    [provider]  Polyethylene Glycol 3350 (MIRALAX PO) Take by mouth. Take as needed for constipation    [provider]  Rivaroxaban (XARELTO) 15 MG TABS tablet Take 15 mg by mouth daily with supper.    [provider]    Physical Exam: BP 136/78   Pulse 72   Temp (!) 97.5 F (36.4 C)   Resp 18   Ht 5\' 8"  (1.727 m)   Wt 75.6 kg   SpO2 98%   BMI 25.34 kg/m   . General: 87 y.o. year-old male well developed well nourished in no acute distress.  Alert and oriented x3. Marland Kitchen HEENT: NCAT, EOMI . Neck: Supple, trachea medial . Cardiovascular: Irregular rate and rhythm with no rubs or gallops.  No thyromegaly or JVD noted.  No lower extremity edema. 2/4 pulses in all 4 extremities. Marland Kitchen Respiratory: Clear to auscultation with no wheezes or rales. Good inspiratory effort. . Abdomen: Soft nontender nondistended with normal bowel sounds x4 quadrants. . Muskuloskeletal: Intermittently endorsed pain of the shoulder, FROM without notable pain or difficulty  No cyanosis, clubbing or edema noted bilaterally . Neuro: CN II-XII intact, sensation, reflexes intact . Skin: No ulcerative lesions noted or rashes . Psychiatry: Mood is appropriate for condition and setting          Labs on Admission:  Basic Metabolic Panel: Recent Labs  Lab 11/01/20 1728  NA 134*  K 4.1  CL 102  CO2 26  GLUCOSE 99  BUN 29*  CREATININE 1.24  CALCIUM 8.7*   Liver Function Tests: Recent Labs  Lab 11/01/20 1728  AST 18  ALT 10  ALKPHOS 82  BILITOT 0.7  PROT 6.3*  ALBUMIN 3.5   No results for input(s): LIPASE, AMYLASE in the last 168 hours. No results for input(s): AMMONIA in the last 168 hours. CBC: Recent Labs  Lab 11/01/20 1728 11/02/20 0414  WBC 5.8 5.9  NEUTROABS 3.9  --   HGB 12.4*  12.5*  HCT 38.0* 38.2*  MCV 100.3* 99.5  PLT 169 153   Cardiac Enzymes: No results for input(s): CKTOTAL, CKMB, CKMBINDEX, TROPONINI in the last 168 hours.  BNP (last 3 results) No results for input(s): BNP in the last 8760 hours.  ProBNP (last 3 results) No results for input(s): PROBNP in the last 8760 hours.  CBG: No results for input(s): GLUCAP in the last 168 hours.  Radiological Exams on Admission: DG Ribs Unilateral W/Chest Left  Result Date: 11/01/2020 CLINICAL DATA:  Fall today while dancing, left shoulder and rib pain. EXAM: LEFT RIBS AND CHEST - 3+ VIEW COMPARISON:  None. FINDINGS: Nondisplaced anterolateral seventh rib fracture. There is no evidence of pneumothorax or pleural effusion. Both lungs are clear. Upper normal heart size. Aortic atherosclerosis. Mediastinal contours are within normal limits. IMPRESSION: Nondisplaced anterolateral seventh rib fracture. No acute pulmonary complication. Electronically Signed   By: Keith Rake M.D.   On: 11/01/2020 17:44  CT Head Wo Contrast  Result Date: 11/01/2020 CLINICAL DATA:  Head trauma, minor (Age >= 65y) Patient reports fall while dancing at nursing facility. Patient reports syncope striking left side of head on floor. EXAM: CT HEAD WITHOUT CONTRAST TECHNIQUE: Contiguous axial images were obtained from the base of the skull through the vertex without intravenous contrast. COMPARISON:  Head CT 06/25/2020 FINDINGS: Brain: Stable degree of atrophy and chronic small vessel ischemia from prior exam. No intracranial hemorrhage, mass effect, or midline shift. No hydrocephalus. The basilar cisterns are patent. No evidence of territorial infarct or acute ischemia. No extra-axial or intracranial fluid collection. Vascular: No hyperdense vessel. Skull: No fracture or focal lesion. Sinuses/Orbits: Paranasal sinuses and mastoid air cells are clear. The visualized orbits are unremarkable. Bilateral cataract resection. Other: Small left  parietal scalp hematoma. IMPRESSION: 1. Small left parietal scalp hematoma. No acute intracranial abnormality. No skull fracture. 2. Stable atrophy and chronic small vessel ischemia. Electronically Signed   By: Keith Rake M.D.   On: 11/01/2020 17:05   CT Cervical Spine Wo Contrast  Result Date: 11/01/2020 CLINICAL DATA:  Neck trauma (Age >= 65y) Patient reports fall while dancing at nursing facility. Patient reports syncope striking left side of head on floor. EXAM: CT CERVICAL SPINE WITHOUT CONTRAST TECHNIQUE: Multidetector CT imaging of the cervical spine was performed without intravenous contrast. Multiplanar CT image reconstructions were also generated. COMPARISON:  Cervical spine CT 05/26/2020 FINDINGS: Alignment: Stable from prior exam, no traumatic subluxation. Trace anterolisthesis of C2 on C3, retrolisthesis of C3 on C4, and anterolisthesis of C7 on T1, unchanged. Skull base and vertebrae: No acute fracture. Vertebral body heights are maintained. The dens and skull base are intact. Soft tissues and spinal canal: No prevertebral fluid or swelling. No visible canal hematoma. Disc levels: Diffuse degenerative disc disease with disc space narrowing and endplate spurring. Scattered facet hypertrophy. No significant change from prior exam Upper chest: No acute or unexpected findings. IMPRESSION: Multilevel degenerative change throughout the cervical spine without acute fracture or subluxation. Electronically Signed   By: Keith Rake M.D.   On: 11/01/2020 17:12   DG Shoulder Left  Result Date: 11/01/2020 CLINICAL DATA:  Fall today while dancing, left shoulder and rib pain. EXAM: LEFT SHOULDER - 2+ VIEW COMPARISON:  None. FINDINGS: There is no evidence of fracture or dislocation. Minor acromioclavicular and glenohumeral degenerative change. Soft tissues are unremarkable. IMPRESSION: No fracture or subluxation of the left shoulder. Electronically Signed   By: Keith Rake M.D.   On: 11/01/2020  17:43   DG Hip Unilat W or Wo Pelvis 2-3 Views Left  Result Date: 11/01/2020 CLINICAL DATA:  Fall today while dancing. EXAM: DG HIP (WITH OR WITHOUT PELVIS) 2-3V LEFT COMPARISON:  None. FINDINGS: The cortical margins of the bony pelvis and left hip are intact. No fracture. Pubic symphysis and sacroiliac joints are congruent. Pubic rami are intact. Minor left hip degenerative change with acetabular spurring. IMPRESSION: Minor left hip osteoarthritis without acute fracture. Electronically Signed   By: Keith Rake M.D.   On: 11/01/2020 17:45    EKG: I independently viewed the EKG done and my findings are as followed: Atrial fibrillation with rate control and PVC or aberrantly conducted impulses.  Assessment/Plan Present on Admission: . Fall . Hyperlipidemia . Atrial fibrillation [facility])  Principal Problem:   Fall Active Problems:   Hyperlipidemia   Atrial fibrillation (HCC)   Syncope   Macrocytic anemia   Left rib fracture   Scalp hematoma   Left  seventh rib fracture and scalp hematoma possibly due to accidental fall Left ribs and chest x-ray showed nondisplaced anterolateral seventh rib fracture  CT head without contrast showed small left parietal scalp hematoma. Continue Tylenol as needed Continue fall precaution and neurochecks Continue PT/OT eval and treat  Reported syncope Patient states that he had an unwitnessed syncope (history obtained from chart EMS reports) Continue telemetry EKG showed atrial fibrillation with rate control  Echocardiogram to rule out significant aortic stenosis or other outflow obstruction, and also to evaluate EF and to rule out segmental/Regional wall motion abnormalities.   Macrocytic anemia Folate and vitamin [name] levels we will check  Chronic atrial fibrillation Continue Xarelto Patient had no medication for rate control per med rec.we shall await updated med rec  Hyperlipidemia Continue Lipitor  DVT prophylaxis: Xarelto  Code  Status: Full code  Family Communication: None at bedside  Disposition Plan:  Patient is from:                        home Anticipated DC to:                   SNF or family members home Anticipated DC date:               1 day Anticipated DC barriers:           Patient requires inpatient management due to fall sustained and further work-up for syncope   Consults called: None  Admission status: Observation    Bernadette Hoit MD [facility]  11/02/2020, 6:39 AM

## 2020-11-02 ENCOUNTER — Observation Stay (HOSPITAL_BASED_OUTPATIENT_CLINIC_OR_DEPARTMENT_OTHER): Payer: Medicare HMO

## 2020-11-02 DIAGNOSIS — I4891 Unspecified atrial fibrillation: Secondary | ICD-10-CM

## 2020-11-02 DIAGNOSIS — S2232XA Fracture of one rib, left side, initial encounter for closed fracture: Secondary | ICD-10-CM | POA: Diagnosis not present

## 2020-11-02 DIAGNOSIS — R55 Syncope and collapse: Secondary | ICD-10-CM

## 2020-11-02 DIAGNOSIS — S0003XA Contusion of scalp, initial encounter: Secondary | ICD-10-CM

## 2020-11-02 DIAGNOSIS — D539 Nutritional anemia, unspecified: Secondary | ICD-10-CM

## 2020-11-02 LAB — MAGNESIUM: Magnesium: 2 mg/dL (ref 1.7–2.4)

## 2020-11-02 LAB — COMPREHENSIVE METABOLIC PANEL
ALT: 10 U/L (ref 0–44)
AST: 17 U/L (ref 15–41)
Albumin: 3.4 g/dL — ABNORMAL LOW (ref 3.5–5.0)
Alkaline Phosphatase: 83 U/L (ref 38–126)
Anion gap: 6 (ref 5–15)
BUN: 23 mg/dL (ref 8–23)
CO2: 29 mmol/L (ref 22–32)
Calcium: 9.1 mg/dL (ref 8.9–10.3)
Chloride: 103 mmol/L (ref 98–111)
Creatinine, Ser: 1.18 mg/dL (ref 0.61–1.24)
GFR, Estimated: 59 mL/min — ABNORMAL LOW (ref >60)
Glucose, Bld: 97 mg/dL (ref 70–99)
Potassium: 4 mmol/L (ref 3.5–5.1)
Sodium: 138 mmol/L (ref 135–145)
Total Bilirubin: 1.1 mg/dL (ref 0.3–1.2)
Total Protein: 6.1 g/dL — ABNORMAL LOW (ref 6.5–8.1)

## 2020-11-02 LAB — ECHOCARDIOGRAM COMPLETE
AR max vel: 3.58 cm2
AV Area VTI: 3.31 cm2
AV Area mean vel: 3.09 cm2
AV Mean grad: 1 mmHg
AV Peak grad: 1.6 mmHg
Ao pk vel: 0.63 m/s
Area-P 1/2: 4.1 cm2
Calc EF: 30 %
Height: 68 in
P 1/2 time: 299 msec
S' Lateral: 3.4 cm
Single Plane A2C EF: 25.3 %
Single Plane A4C EF: 36.5 %
Weight: 2666.68 oz

## 2020-11-02 LAB — CBC
HCT: 38.2 % — ABNORMAL LOW (ref 39.0–52.0)
Hemoglobin: 12.5 g/dL — ABNORMAL LOW (ref 13.0–17.0)
MCH: 32.6 pg (ref 26.0–34.0)
MCHC: 32.7 g/dL (ref 30.0–36.0)
MCV: 99.5 fL (ref 80.0–100.0)
Platelets: 153 10*3/uL (ref 150–400)
RBC: 3.84 MIL/uL — ABNORMAL LOW (ref 4.22–5.81)
RDW: 12.7 % (ref 11.5–15.5)
WBC: 5.9 10*3/uL (ref 4.0–10.5)
nRBC: 0 % (ref 0.0–0.2)

## 2020-11-02 LAB — PROTIME-INR
INR: 1.2 (ref 0.8–1.2)
Prothrombin Time: 14.7 seconds (ref 11.4–15.2)

## 2020-11-02 LAB — PHOSPHORUS: Phosphorus: 3.7 mg/dL (ref 2.5–4.6)

## 2020-11-02 LAB — APTT: aPTT: 34 seconds (ref 24–36)

## 2020-11-02 LAB — MRSA PCR SCREENING: MRSA by PCR: NEGATIVE

## 2020-11-02 MED ORDER — RIVAROXABAN 15 MG PO TABS
15.0000 mg | ORAL_TABLET | Freq: Every day | ORAL | Status: DC
  Administered 2020-11-02: 15 mg via ORAL
  Filled 2020-11-02: qty 1

## 2020-11-02 MED ORDER — ACETAMINOPHEN 325 MG PO TABS
650.0000 mg | ORAL_TABLET | Freq: Four times a day (QID) | ORAL | Status: DC | PRN
  Administered 2020-11-02 (×2): 650 mg via ORAL
  Filled 2020-11-02 (×3): qty 2

## 2020-11-02 MED ORDER — ATORVASTATIN CALCIUM 20 MG PO TABS
20.0000 mg | ORAL_TABLET | Freq: Every day | ORAL | Status: DC
  Administered 2020-11-02: 20 mg via ORAL
  Filled 2020-11-02: qty 1

## 2020-11-02 MED ORDER — PERFLUTREN LIPID MICROSPHERE
1.0000 mL | INTRAVENOUS | Status: AC | PRN
  Administered 2020-11-02: 2 mL via INTRAVENOUS
  Filled 2020-11-02: qty 10

## 2020-11-02 MED ORDER — ENOXAPARIN SODIUM 40 MG/0.4ML ~~LOC~~ SOLN
40.0000 mg | SUBCUTANEOUS | Status: DC
  Administered 2020-11-02: 40 mg via SUBCUTANEOUS
  Filled 2020-11-02: qty 0.4

## 2020-11-02 MED ORDER — QUETIAPINE FUMARATE 25 MG PO TABS
25.0000 mg | ORAL_TABLET | Freq: Every day | ORAL | Status: DC
  Administered 2020-11-02: 25 mg via ORAL
  Filled 2020-11-02: qty 1

## 2020-11-02 NOTE — TOC Initial Note (Signed)
Transition of Care [facility]) - Initial/Assessment Note    Patient Details  Name: [name] MRN: [id number] Date of Birth: 1929/09/24  [facility]) CM/SW Contact:    Iona Beard, [place] Phone Number: 11/02/2020, 3:55 PM  Clinical Narrative:                 [facility] consulted as PT is recommending SNF. CSW spoke with legal guardian Glennie Hawk with [facility] APS who states pt is a ward of the state. Per Morey Hummingbird she is agreeable for pts referral to be sent out to local SNF facilities. CSW completed Fl2. Referral has been sent to local facilities. TOC to follow.   Expected Discharge Plan: Skilled Nursing Facility Barriers to Discharge: Continued Medical Work up   Patient Goals and CMS Choice Patient states their goals for this hospitalization and ongoing recovery are:: Go to SNF CMS Medicare.gov Compare Post Acute Care list provided to:: Legal Guardian Choice offered to / list presented to : [place] / Guardian  Expected Discharge Plan and Services Expected Discharge Plan: [place] In-house Referral: Clinical Social Work Discharge Planning Services: CM Consult Post Acute Care Choice: [place] arrangements for the past 2 months: [place]                 DME Arranged: N/A DME Agency: NA       HH Arranged: NA [place] Agency: NA        Prior Living Arrangements/Services Living arrangements for the past 2 months: [place] Lives with:: Self,Facility Resident Patient language and need for interpreter reviewed:: Yes Do you feel safe going back to the place where you live?: Yes      Need for Family Participation in Patient Care: Yes (Comment) Care giver support system in place?: Yes (comment) Current home services: DME Criminal Activity/Legal Involvement Pertinent to Current Situation/Hospitalization: No - Comment as needed  Activities of Daily Living Home Assistive Devices/Equipment:  None ADL Screening (condition at time of admission) Patient's cognitive ability adequate to safely complete daily activities?: Yes Is the patient deaf or have difficulty hearing?: Yes Does the patient have difficulty seeing, even when wearing glasses/contacts?: No Does the patient have difficulty concentrating, remembering, or making decisions?: Yes Patient able to express need for assistance with ADLs?: Yes Does the patient have difficulty dressing or bathing?: Yes Independently performs ADLs?: No Communication: Independent Dressing (OT): Needs assistance Grooming: Needs assistance Is this a change from baseline?: Pre-admission baseline Feeding: Independent Bathing: Needs assistance Is this a change from baseline?: Pre-admission baseline Toileting: Needs assistance Is this a change from baseline?: Pre-admission baseline In/Out Bed: Needs assistance,Appropriate for developmental age Is this a change from baseline?: Pre-admission baseline Walks in Home: Independent Does the patient have difficulty walking or climbing stairs?: Yes Weakness of Legs: None Weakness of Arms/Hands: None  Permission Sought/Granted                  Emotional Assessment Appearance:: Appears stated age Attitude/Demeanor/Rapport: Other (comment) (Spoke with legal guardian) Affect (typically observed): Other (comment) (Spoke with legal guardian) Orientation: : Oriented to Self,Oriented to Situation,Oriented to Place Alcohol / Substance Use: Not Applicable Psych Involvement: No (comment)  Admission diagnosis:  Fall [W19.XXXA] Fall, initial encounter [id number]] Closed fracture of one rib of left side, initial encounter [S22.32XA] Patient Active Problem List   Diagnosis Date Noted  . Syncope 11/02/2020  . Macrocytic anemia 11/02/2020  . Left rib fracture 11/02/2020  . Scalp  hematoma 11/02/2020  . Fall 11/01/2020  . Hygroma 09/10/2018  . 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 Chest pain 12/29/2013  . Atrial fibrillation ([place]) 12/29/2013  . VERTIGO 02/04/2010  . [name] DEFICIENCY 01/31/2010  . Memory loss 01/27/2010  . PARESTHESIA 01/27/2010  . Hyperlipidemia 11/02/2009  . CORONARY ARTERY DISEASE 11/02/2009  . SINUSITIS, ACUTE 11/02/2009  . EPISTAXIS 11/02/2009  . COLONIC POLYPS, HX OF 11/02/2009   PCP:  Rosita Fire, MD Pharmacy:  No Pharmacies Listed    Social Determinants of Health (SDOH) Interventions    Readmission Risk Interventions No flowsheet data found.

## 2020-11-02 NOTE — Evaluation (Signed)
Occupational Therapy Evaluation Patient Details Name: [name] MRN: [id number] DOB: [birth year] Today's Date: 11/02/2020    History of Present Illness HPI: [name] is a 87 y.o. male with medical history significant for CAD, dementia, hyperlipidemia, atrial fibrillation, bladder cancer who presents to the ED emergency department from local SNF with complain of passing out prior to arrival.  Patient states that he was sitting on a chair and he started to have a left shoulder pain, he states that he slid off the chair and passed out on the floor, he was not sure how long he passed out, but on waking up, he had difficulty in being able to get up, but he eventually was able to call for help.  He denied any bladder or bowel incontinence.  Of note, EMS report was completely different from patient's, per report, patient was dancing and fell injuring the left shoulder and left ribs.  Patient endorsed some intermittent left shoulder and left hip pain at baseline.  He denies chest pain, shortness of breath, back pain, neck pain, nausea, vomiting or abdominal pain.   Clinical Impression   Pt agreeable to OT/PT co-evaluation. Pt reported coming from SNF where he lives long term. Pt able to complete functional transfers with Mod I to SPV for safety. Mild deficits noted in balance when ambulating without AE/DME. Pt able to stand at sink and complete grooming with SPV. Pt demonstrated UE functional use WFL. Pt not recommended for further OT services and will be discharged to the care of nursing until return to SNF facility.     Follow Up Recommendations  No OT follow up;SNF;Other (comment);Supervision - Intermittent (Return to SNF for SPV as needed.)    Equipment Recommendations  None recommended by OT           Precautions / Restrictions Precautions Precautions: Fall Restrictions Weight Bearing Restrictions: No      Mobility Bed Mobility Overal bed mobility: Independent                   Transfers Overall transfer level: Needs assistance   Transfers: Sit to/from Stand;Stand Pivot Transfers Sit to Stand: Modified independent (Device/Increase time);Supervision Stand pivot transfers: Modified independent (Device/Increase time);Supervision       General transfer comment: Mod I to SPV for functional transfers and ambulation.    Balance Overall balance assessment: Mild deficits observed, not formally tested                                         ADL either performed or assessed with clinical judgement   ADL Overall ADL's : Needs assistance/impaired     Grooming: Standing;Brushing hair;Wash/dry face;Supervision/safety Grooming Details (indicate cue type and reason): Able to wash hands and brush hair with SPV standing at sink.                 Toilet Transfer: Modified Independent;Supervision/safety;Ambulation;Grab bars Toilet Transfer Details (indicate cue type and reason): Ambulatory transfer from walking in hall to toilet.                 Vision Baseline Vision/History: No visual deficits                  Pertinent Vitals/Pain Pain Assessment: No/denies pain     Hand Dominance Right   Extremity/Trunk Assessment Upper Extremity Assessment Upper Extremity Assessment: Overall WFL for tasks assessed  Lower Extremity Assessment Lower Extremity Assessment: Defer to PT evaluation   Cervical / Trunk Assessment Cervical / Trunk Assessment: Normal   Communication Communication Communication: No difficulties   Cognition Arousal/Alertness: Awake/alert Behavior During Therapy: WFL for tasks assessed/performed Overall Cognitive Status: History of cognitive impairments - at baseline                                                      Home Living Family/patient expects to be discharged to:: Skilled nursing facility                                        Prior  Functioning/Environment Level of Independence: Independent        Comments: Independnet ADL at nursing facility per pt report.                      OT Goals(Current goals can be found in the care plan section) Acute Rehab OT Goals Patient Stated Goal: return to SNF                   Co-evaluation PT/OT/SLP Co-Evaluation/Treatment: Yes Reason for Co-Treatment: To address functional/ADL transfers   OT goals addressed during session: ADL's and self-care      AM-PAC OT "6 Clicks" Daily Activity     Outcome Measure Help from another person eating meals?: None Help from another person taking care of personal grooming?: None Help from another person toileting, which includes using toliet, bedpan, or urinal?: None Help from another person bathing (including washing, rinsing, drying)?: None Help from another person to put on and taking off regular upper body clothing?: None Help from another person to put on and taking off regular lower body clothing?: None 6 Click Score: 24   End of Session    Activity Tolerance: Patient tolerated treatment well Patient left: in chair;with call bell/phone within reach;with chair alarm set  OT Visit Diagnosis: Unsteadiness on feet (R26.81);History of falling (Z91.81)                Time: 5465-6812 OT Time Calculation (min): 22 min Charges:  OT General Charges $OT Visit: 1 Visit OT Evaluation $OT Eval Low Complexity: 1 Low  Emmagrace Runkel OT, MOT   Larey Seat 11/02/2020, 12:53 PM

## 2020-11-02 NOTE — Evaluation (Signed)
Physical Therapy Evaluation Patient Details Name: [name] MRN: [id number] DOB: May 29, 1930 Today's Date: 11/02/2020   History of Present Illness  HPI: [name] is a 87 y.o. male with medical history significant for CAD, dementia, hyperlipidemia, atrial fibrillation, bladder cancer who presents to the ED emergency department from local SNF with complain of passing out prior to arrival.  Patient states that he was sitting on a chair and he started to have a left shoulder pain, he states that he slid off the chair and passed out on the floor, he was not sure how long he passed out, but on waking up, he had difficulty in being able to get up, but he eventually was able to call for help.  He denied any bladder or bowel incontinence.  Of note, EMS report was completely different from patient's, per report, patient was dancing and fell injuring the left shoulder and left ribs.  Patient endorsed some intermittent left shoulder and left hip pain at baseline.  He denies chest pain, shortness of breath, back pain, neck pain, nausea, vomiting or abdominal pain.    Clinical Impression  Patient functioning at baseline for functional mobility and gait.  Plan:  Patient discharged from physical therapy to care of nursing for ambulation daily as tolerated for length of stay.     Follow Up Recommendations SNF    Equipment Recommendations  None recommended by PT    Recommendations for Other Services       Precautions / Restrictions Precautions Precautions: Fall Restrictions Weight Bearing Restrictions: No      Mobility  Bed Mobility Overal bed mobility: Independent                  Transfers Overall transfer level: Needs assistance Equipment used: None Transfers: Sit to/from Stand;Stand Pivot Transfers Sit to Stand: Modified independent (Device/Increase time);Supervision Stand pivot transfers: Modified independent (Device/Increase time);Supervision       General  transfer comment: demonstrates good return for transferring to commode in bathroom and to chair at bedside  Ambulation/Gait Ambulation/Gait assistance: Modified independent (Device/Increase time) Gait Distance (Feet): 120 Feet Assistive device: None Gait Pattern/deviations: WFL(Within Functional Limits);Decreased step length - left;Decreased stance time - right;Decreased stride length Gait velocity: decreased   General Gait Details: grossly WFL, other than requiring verbal cues to let arms swing with fair/good carryover, slightly labored cadence with step/stride length, no loss of balance  Stairs            Wheelchair Mobility    Modified Rankin (Stroke Patients Only)       Balance Overall balance assessment: Mild deficits observed, not formally tested                                           Pertinent Vitals/Pain Pain Assessment: No/denies pain    Home Living Family/patient expects to be discharged to:: Skilled nursing facility                      Prior Function Level of Independence: Needs assistance   Gait / Transfers Assistance Needed: household ambulator without AD     Comments: Independnet ADL at nursing facility per pt report.     Hand Dominance   Dominant Hand: Right    Extremity/Trunk Assessment   Upper Extremity Assessment Upper Extremity Assessment: Defer to OT evaluation    Lower Extremity Assessment Lower  Extremity Assessment: Overall WFL for tasks assessed    Cervical / Trunk Assessment Cervical / Trunk Assessment: Normal  Communication   Communication: No difficulties  Cognition Arousal/Alertness: Awake/alert Behavior During Therapy: WFL for tasks assessed/performed Overall Cognitive Status: History of cognitive impairments - at baseline                                        General Comments      Exercises     Assessment/Plan    PT Assessment Patent does not need any further PT  services  PT Problem List         PT Treatment Interventions      PT Goals (Current goals can be found in the Care Plan section)  Acute Rehab PT Goals Patient Stated Goal: return to SNF PT Goal Formulation: With patient Time For Goal Achievement: 11/02/20 Potential to Achieve Goals: Good    Frequency     Barriers to discharge        Co-evaluation PT/OT/SLP Co-Evaluation/Treatment: Yes Reason for Co-Treatment: To address functional/ADL transfers PT goals addressed during session: Mobility/safety with mobility;Balance;Proper use of DME OT goals addressed during session: ADL's and self-care       AM-PAC PT "6 Clicks" Mobility  Outcome Measure Help needed turning from your back to your side while in a flat bed without using bedrails?: None Help needed moving from lying on your back to sitting on the side of a flat bed without using bedrails?: None Help needed moving to and from a bed to a chair (including a wheelchair)?: None Help needed standing up from a chair using your arms (e.g., wheelchair or bedside chair)?: None Help needed to walk in hospital room?: None Help needed climbing 3-5 steps with a railing? : A Little 6 Click Score: 23    End of Session   Activity Tolerance: Patient tolerated treatment well Patient left: in chair;with call bell/phone within reach;with chair alarm set Nurse Communication: Mobility status PT Visit Diagnosis: Unsteadiness on feet (R26.81);Other abnormalities of gait and mobility (R26.89);Muscle weakness (generalized) (M62.81)    Time: 3903-0092 PT Time Calculation (min) (ACUTE ONLY): 20 min   Charges:   PT Evaluation $PT Eval Moderate Complexity: 1 Mod PT Treatments $Therapeutic Activity: 8-22 mins        1:41 PM, 11/02/20 Lonell Grandchild, MPT Physical Therapist with [facility] [phone] office [phone] mobile phone

## 2020-11-02 NOTE — Progress Notes (Signed)
Pt arrived to room #305 at approx 0815 from ED. Pt alert, oriented x3, cooperative. Only c/o is left sided posterior rib area pain, pt medicated with tylenol per order by Ander Purpura, LPN. Swallowed pills whole without difficulty. Pt oriented to room and safety procedures, states understanding. Pt high fall risk, bed alarm on. Pt advised to call for assistance prior to getting OOB and showed how to use call bell. States understanding.   Pt systolic b/p 90 lying, 465 with standing. Pt denies any dizziness or lightheadedness with movement or change of position. Pt ambulated to hallway corridor and back with only standby assist, tolerated well and gait is steady.

## 2020-11-02 NOTE — Progress Notes (Signed)
2D echocardiogram completed.  11/02/2020 3:21 PM Kelby Aline., MHA, RVT, RDCS, RDMS

## 2020-11-02 NOTE — Progress Notes (Signed)
PROGRESS NOTE    [name]  [id number] DOB: 1929/08/10 DOA: 11/01/2020 PCP: Rosita Fire, MD    Brief Narrative:  87 y.o. male with medical history significant for CAD, dementia, hyperlipidemia, atrial fibrillation, bladder cancer who presents to the ED emergency department from local SNF with complain of passing out prior to arrival.  Pt initially claimed that he was sitting on a chair and he started to have a left shoulder pain, he states that he slid off the chair and passed out on the floor, he was not sure how long he passed out, but on waking up, he had difficulty in being able to get up, but he eventually was able to call for help.  He denied any bladder or bowel incontinence. On workup, pt was found to have nondisplaced seventh rib fracture, L shoulder xray unremarkable, and CT neg. Pt was admitted for further work up  On further questioning, pt acknowledged mechanical fall while dancing with other residents, falling backwards and hitting back of head, denied frank loc  Assessment & Plan:   Principal Problem:   Fall Active Problems:   Hyperlipidemia   Atrial fibrillation ([place])   Syncope   Macrocytic anemia   Left rib fracture   Scalp hematoma   Left seventh rib fracture and scalp hematoma possibly due to accidental fall Left ribs and chest x-ray showed nondisplaced anterolateral seventh rib fracture  CT head without contrast showed small left parietal scalp hematoma. Continue Tylenol as needed Continue fall precaution and neurochecks PT/OT was consulted  Syncope likely ruled out -On repeat hisotry, patient acknowledged mechanical fall backwards after tripping on an object while dancing, denied frank loc -2d echo was ordered at time of admit, reviewed with EF of 40%  Macrocytic anemia Folate and vitamin [name] levels pending -Recheck CBC in AM  Chronic atrial fibrillation -Continue Xarelto -Currently rate controlled  Hyperlipidemia Continue  Lipitor  DVT prophylaxis: Xarelto Code Status: Full Family Communication: Pt in room, family not at bedside  Status is: Observation  The patient remains OBS appropriate and will d/c before 2 midnights.  Dispo: The patient is from: SNF              Anticipated d/c is to: SNF              Patient currently is not medically stable to d/c.   Difficult to place patient No       Consultants:     Procedures:     Antimicrobials: Anti-infectives (From admission, onward)   None       Subjective: Without complaints  Objective: Vitals:   11/02/20 0556 11/02/20 0833 11/02/20 0835 11/02/20 1222  BP: 136/78 99/65 103/67 97/68  Pulse: 72 67 78   Resp: 18   16  Temp:  98.3 F (36.8 C)  98 F (36.7 C)  TempSrc:  Oral  Oral  SpO2: 98% 98% 100% 96%  Weight:      Height:        Intake/Output Summary (Last 24 hours) at 11/02/2020 1656 Last data filed at 11/02/2020 1300 Gross per 24 hour  Intake 240 ml  Output --  Net 240 ml   Filed Weights   11/01/20 1601  Weight: 75.6 kg    Examination: General exam: Awake, laying in bed, in nad Respiratory system: Normal respiratory effort, no wheezing Cardiovascular system: regular rate, s1, s2 Gastrointestinal system: Soft, nondistended, positive BS Central nervous system: CN2-12 grossly intact, strength intact Extremities: Perfused, no clubbing Skin: Normal  skin turgor, no notable skin lesions seen Psychiatry: Mood normal // no visual hallucinations   Data Reviewed: I have personally reviewed following labs and imaging studies  CBC: Recent Labs  Lab 11/01/20 1728 11/02/20 0414  WBC 5.8 5.9  NEUTROABS 3.9  --   HGB 12.4* 12.5*  HCT 38.0* 38.2*  MCV 100.3* 99.5  PLT 169 154   Basic Metabolic Panel: Recent Labs  Lab 11/01/20 1728 11/02/20 0414  NA 134* 138  K 4.1 4.0  CL 102 103  CO2 26 29  GLUCOSE 99 97  BUN 29* 23  CREATININE 1.24 1.18  CALCIUM 8.7* 9.1  MG  --  2.0  PHOS  --  3.7   GFR: Estimated  Creatinine Clearance: 41.9 mL/min (by C-G formula based on SCr of 1.18 mg/dL). Liver Function Tests: Recent Labs  Lab 11/01/20 1728 11/02/20 0414  AST 18 17  ALT 10 10  ALKPHOS 82 83  BILITOT 0.7 1.1  PROT 6.3* 6.1*  ALBUMIN 3.5 3.4*   No results for input(s): LIPASE, AMYLASE in the last 168 hours. No results for input(s): AMMONIA in the last 168 hours. Coagulation Profile: Recent Labs  Lab 11/02/20 0414  INR 1.2   Cardiac Enzymes: No results for input(s): CKTOTAL, CKMB, CKMBINDEX, TROPONINI in the last 168 hours. BNP (last 3 results) No results for input(s): PROBNP in the last 8760 hours. HbA1C: No results for input(s): HGBA1C in the last 72 hours. CBG: No results for input(s): GLUCAP in the last 168 hours. Lipid Profile: No results for input(s): CHOL, HDL, LDLCALC, TRIG, CHOLHDL, LDLDIRECT in the last 72 hours. Thyroid Function Tests: No results for input(s): TSH, T4TOTAL, FREET4, T3FREE, THYROIDAB in the last 72 hours. Anemia Panel: No results for input(s): VITAMINB12, FOLATE, FERRITIN, TIBC, IRON, RETICCTPCT in the last 72 hours. Sepsis Labs: No results for input(s): PROCALCITON, LATICACIDVEN in the last 168 hours.  No results found for this or any previous visit (from the past 240 hour(s)).   Radiology Studies: DG Ribs Unilateral W/Chest Left  Result Date: 11/01/2020 CLINICAL DATA:  Fall today while dancing, left shoulder and rib pain. EXAM: LEFT RIBS AND CHEST - 3+ VIEW COMPARISON:  None. FINDINGS: Nondisplaced anterolateral seventh rib fracture. There is no evidence of pneumothorax or pleural effusion. Both lungs are clear. Upper normal heart size. Aortic atherosclerosis. Mediastinal contours are within normal limits. IMPRESSION: Nondisplaced anterolateral seventh rib fracture. No acute pulmonary complication. Electronically Signed   By: Keith Rake M.D.   On: 11/01/2020 17:44   CT Head Wo Contrast  Result Date: 11/01/2020 CLINICAL DATA:  Head trauma, minor  (Age >= 65y) Patient reports fall while dancing at nursing facility. Patient reports syncope striking left side of head on floor. EXAM: CT HEAD WITHOUT CONTRAST TECHNIQUE: Contiguous axial images were obtained from the base of the skull through the vertex without intravenous contrast. COMPARISON:  Head CT 06/25/2020 FINDINGS: Brain: Stable degree of atrophy and chronic small vessel ischemia from prior exam. No intracranial hemorrhage, mass effect, or midline shift. No hydrocephalus. The basilar cisterns are patent. No evidence of territorial infarct or acute ischemia. No extra-axial or intracranial fluid collection. Vascular: No hyperdense vessel. Skull: No fracture or focal lesion. Sinuses/Orbits: Paranasal sinuses and mastoid air cells are clear. The visualized orbits are unremarkable. Bilateral cataract resection. Other: Small left parietal scalp hematoma. IMPRESSION: 1. Small left parietal scalp hematoma. No acute intracranial abnormality. No skull fracture. 2. Stable atrophy and chronic small vessel ischemia. Electronically Signed   By: Threasa Beards  Sanford M.D.   On: 11/01/2020 17:05   CT Cervical Spine Wo Contrast  Result Date: 11/01/2020 CLINICAL DATA:  Neck trauma (Age >= 65y) Patient reports fall while dancing at nursing facility. Patient reports syncope striking left side of head on floor. EXAM: CT CERVICAL SPINE WITHOUT CONTRAST TECHNIQUE: Multidetector CT imaging of the cervical spine was performed without intravenous contrast. Multiplanar CT image reconstructions were also generated. COMPARISON:  Cervical spine CT 05/26/2020 FINDINGS: Alignment: Stable from prior exam, no traumatic subluxation. Trace anterolisthesis of C2 on C3, retrolisthesis of C3 on C4, and anterolisthesis of C7 on T1, unchanged. Skull base and vertebrae: No acute fracture. Vertebral body heights are maintained. The dens and skull base are intact. Soft tissues and spinal canal: No prevertebral fluid or swelling. No visible canal  hematoma. Disc levels: Diffuse degenerative disc disease with disc space narrowing and endplate spurring. Scattered facet hypertrophy. No significant change from prior exam Upper chest: No acute or unexpected findings. IMPRESSION: Multilevel degenerative change throughout the cervical spine without acute fracture or subluxation. Electronically Signed   By: Keith Rake M.D.   On: 11/01/2020 17:12   DG Shoulder Left  Result Date: 11/01/2020 CLINICAL DATA:  Fall today while dancing, left shoulder and rib pain. EXAM: LEFT SHOULDER - 2+ VIEW COMPARISON:  None. FINDINGS: There is no evidence of fracture or dislocation. Minor acromioclavicular and glenohumeral degenerative change. Soft tissues are unremarkable. IMPRESSION: No fracture or subluxation of the left shoulder. Electronically Signed   By: Keith Rake M.D.   On: 11/01/2020 17:43   ECHOCARDIOGRAM COMPLETE  Result Date: 11/02/2020    ECHOCARDIOGRAM REPORT   Patient Name:   [name] Date of Exam: 11/02/2020 Medical Rec #:  [id number]           Height:       68.0 in Accession #:    [id number]          Weight:       166.7 lb Date of Birth:  Sep 13, 1929          BSA:          1.891 m Patient Age:    47 years            BP:           113/57 mmHg Patient Gender: M                   HR:           96 bpm. Exam Location:  Inpatient Procedure: 2D Echo, Cardiac Doppler, Color Doppler and Intracardiac            Opacification Agent Indications:    CHF  History:        Patient has prior history of Echocardiogram examinations, most                 recent 09/10/2018. CAD, COPD; Risk Factors:Hypertension,                 Dyslipidemia and Diabetes.  Sonographer:    Maudry Mayhew MHA, RDMS, RVT, RDCS Referring Phys: [id number] [facility]  Sonographer Comments: Image acquisition challenging due to patient body habitus and Image acquisition challenging due to respiratory motion. IMPRESSIONS  1. Technically difficult assessment of LV function, limited  visualization of endocardium even with the use of echocontrast. LVEF roughly 40%. Left ventricular ejection fraction, by estimation, is 40%. The left ventricle has normal function. Left ventricular endocardial border not optimally defined to evaluate  regional wall motion. Left ventricular diastolic parameters are indeterminate.  2. Right ventricular systolic function was not well visualized. The right ventricular size is not well visualized.  3. Left atrial size was severely dilated.  4. The mitral valve was not well visualized. No evidence of mitral valve regurgitation. No evidence of mitral stenosis.  5. The aortic valve is tricuspid. There is mild calcification of the aortic valve. There is mild thickening of the aortic valve. Aortic valve regurgitation is mild. No aortic stenosis is present. FINDINGS  Left Ventricle: Technically difficult assessment of LV function, limited visualization of endocardium even with the use of echocontrast. LVEF roughly 40%. Left ventricular ejection fraction, by estimation, is 40%. The left ventricle has normal function.  Left ventricular endocardial border not optimally defined to evaluate regional wall motion. Definity contrast agent was given IV to delineate the left ventricular endocardial borders. The left ventricular internal cavity size was normal in size. There is no left ventricular hypertrophy. Left ventricular diastolic parameters are indeterminate. Right Ventricle: RV poorly visualized, grossly appears normal in size and function. The right ventricular size is not well visualized. Right vetricular wall thickness was not assessed. Right ventricular systolic function was not well visualized. Left Atrium: Left atrial size was severely dilated. Right Atrium: Right atrial size was not well visualized. Pericardium: There is no evidence of pericardial effusion. Mitral Valve: The mitral valve was not well visualized. No evidence of mitral valve regurgitation. No evidence of  mitral valve stenosis. Tricuspid Valve: The tricuspid valve is normal in structure. Tricuspid valve regurgitation is not demonstrated. No evidence of tricuspid stenosis. Aortic Valve: The aortic valve is tricuspid. There is mild calcification of the aortic valve. There is mild thickening of the aortic valve. There is mild aortic valve annular calcification. Aortic valve regurgitation is mild. Aortic regurgitation PHT measures 299 msec. No aortic stenosis is present. Aortic valve mean gradient measures 1.0 mmHg. Aortic valve peak gradient measures 1.6 mmHg. Aortic valve area, by VTI measures 3.31 cm. Pulmonic Valve: The pulmonic valve was not well visualized. Pulmonic valve regurgitation is mild. No evidence of pulmonic stenosis. Aorta: The aortic root is normal in size and structure. Pulmonary Artery: Indeterminant PASP, inadequate TR jet. Venous: The inferior vena cava was not well visualized. IAS/Shunts: The interatrial septum was not well visualized.  LEFT VENTRICLE PLAX 2D LVIDd:         4.80 cm LVIDs:         3.40 cm LV PW:         0.80 cm LV IVS:        0.60 cm LVOT diam:     2.20 cm LV SV:         44 LV SV Index:   23 LVOT Area:     3.80 cm  LV Volumes (MOD) LV vol d, MOD A2C: 45.3 ml LV vol d, MOD A4C: 59.7 ml LV vol s, MOD A2C: 33.8 ml LV vol s, MOD A4C: 37.9 ml LV SV MOD A2C:     11.5 ml LV SV MOD A4C:     59.7 ml LV SV MOD BP:      15.3 ml RIGHT VENTRICLE RV S prime:     6.96 cm/s TAPSE (M-mode): 1.3 cm LEFT ATRIUM              Index       RIGHT ATRIUM           Index LA diam:  4.40 cm  2.33 cm/m  RA Area:     20.30 cm LA Vol (A2C):   73.7 ml  38.97 ml/m RA Volume:   57.10 ml  30.19 ml/m LA Vol (A4C):   106.0 ml 56.04 ml/m LA Biplane Vol: 92.2 ml  48.75 ml/m  AORTIC VALVE AV Area (Vmax):    3.58 cm AV Area (Vmean):   3.09 cm AV Area (VTI):     3.31 cm AV Vmax:           63.00 cm/s AV Vmean:          46.300 cm/s AV VTI:            0.132 m AV Peak Grad:      1.6 mmHg AV Mean Grad:      1.0  mmHg LVOT Vmax:         59.40 cm/s LVOT Vmean:        37.600 cm/s LVOT VTI:          0.115 m LVOT/AV VTI ratio: 0.87 AI PHT:            299 msec  AORTA Ao Root diam: 3.30 cm MITRAL VALVE               TRICUSPID VALVE MV Area (PHT): 4.10 cm    TR Peak grad:   17.5 mmHg MV Decel Time: 185 msec    TR Vmax:        209.00 cm/s MV E velocity: 87.40 cm/s                            SHUNTS                            Systemic VTI:  0.12 m                            Systemic Diam: 2.20 cm Carlyle Dolly MD Electronically signed by Carlyle Dolly MD Signature Date/Time: 11/02/2020/4:08:50 PM    Final    DG Hip Unilat W or Wo Pelvis 2-3 Views Left  Result Date: 11/01/2020 CLINICAL DATA:  Fall today while dancing. EXAM: DG HIP (WITH OR WITHOUT PELVIS) 2-3V LEFT COMPARISON:  None. FINDINGS: The cortical margins of the bony pelvis and left hip are intact. No fracture. Pubic symphysis and sacroiliac joints are congruent. Pubic rami are intact. Minor left hip degenerative change with acetabular spurring. IMPRESSION: Minor left hip osteoarthritis without acute fracture. Electronically Signed   By: Keith Rake M.D.   On: 11/01/2020 17:45    Scheduled Meds: . atorvastatin  20 mg Oral q1800  . Rivaroxaban  15 mg Oral Q supper   Continuous Infusions:   LOS: 0 days   Marylu Lund, MD [facility] Pager On Amion  If 7PM-7AM, please contact night-coverage 11/02/2020, 4:56 PM

## 2020-11-02 NOTE — NC FL2 (Signed)
  [place] LEVEL OF CARE SCREENING TOOL     IDENTIFICATION  Patient Name: TEAGON KRON Birthdate: 04-07-1930 Sex: male Admission Date (Current Location): 11/01/2020  [facility] and [place] Number:  [employer] and Address:  [place] [address] [place]      Provider Number: [id number]  Attending Physician Name and Address:  Donne Hazel, MD  Relative Name and Phone Number:  Glennie Hawk ([place])   [phone]    Current Level of Care: Hospital Recommended Level of Care: [place] Prior Approval Number:    Date Approved/Denied:   PASRR Number: [id number]  Discharge Plan: SNF    Current Diagnoses: Patient Active Problem List   Diagnosis Date Noted  . Syncope 11/02/2020  . Macrocytic anemia 11/02/2020  . Left rib fracture 11/02/2020  . Scalp hematoma 11/02/2020  . Fall 11/01/2020  . Hygroma 09/10/2018  . 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 Chest pain 12/29/2013  . Atrial fibrillation ([place]) 12/29/2013  . VERTIGO 02/04/2010  . [name] DEFICIENCY 01/31/2010  . Memory loss 01/27/2010  . PARESTHESIA 01/27/2010  . Hyperlipidemia 11/02/2009  . CORONARY ARTERY DISEASE 11/02/2009  . SINUSITIS, ACUTE 11/02/2009  . EPISTAXIS 11/02/2009  . COLONIC POLYPS, HX OF 11/02/2009    Orientation RESPIRATION BLADDER Height & Weight     Self,Place,Time  Normal Continent Weight: 166 lb 10.7 oz (75.6 kg) Height:  5\' 8"  (172.7 cm)  BEHAVIORAL SYMPTOMS/MOOD NEUROLOGICAL BOWEL NUTRITION STATUS      Continent Diet (Heart healthy)  AMBULATORY STATUS COMMUNICATION OF NEEDS Skin   Limited Assist Verbally Normal                       Personal Care Assistance Level of Assistance  Bathing,Feeding,Dressing,Total care Bathing Assistance: Limited assistance Feeding assistance: Independent Dressing Assistance: Limited assistance Total Care Assistance: Limited  assistance   Functional Limitations Info  Sight,Hearing,Speech Sight Info: Adequate Hearing Info: Impaired Speech Info: Adequate    SPECIAL CARE FACTORS FREQUENCY  PT (By licensed PT),OT (By licensed OT)     PT Frequency: 5 times weekly OT Frequency: 5 times weekly            Contractures Contractures Info: Not present    Additional Factors Info  Code Status,Allergies Code Status Info: FULL Allergies Info: NKA           Current Medications (11/02/2020):  This is the current hospital active medication list Current Facility-Administered Medications  Medication Dose Route Frequency Provider Last Rate Last Admin  . acetaminophen (TYLENOL) tablet 650 mg  650 mg Oral Q6H PRN Adefeso, Oladapo, DO   650 mg at 11/02/20 0830  . atorvastatin (LIPITOR) tablet 20 mg  20 mg Oral q1800 Adefeso, Oladapo, DO      . perflutren lipid microspheres (DEFINITY) IV suspension  1-10 mL Intravenous PRN Donne Hazel, MD   2 mL at 11/02/20 1523  . Rivaroxaban (XARELTO) tablet 15 mg  15 mg Oral Q supper Bernadette Hoit, DO         Discharge Medications: Please see discharge summary for a list of discharge medications.  Relevant Imaging Results:  Relevant Lab Results:   Additional Information SSN: [id number]  Iona Beard, [place]

## 2020-11-02 NOTE — Plan of Care (Signed)

## 2020-11-03 DIAGNOSIS — R55 Syncope and collapse: Secondary | ICD-10-CM | POA: Diagnosis not present

## 2020-11-03 DIAGNOSIS — D539 Nutritional anemia, unspecified: Secondary | ICD-10-CM | POA: Diagnosis not present

## 2020-11-03 DIAGNOSIS — S2232XA Fracture of one rib, left side, initial encounter for closed fracture: Secondary | ICD-10-CM | POA: Diagnosis not present

## 2020-11-03 DIAGNOSIS — I482 Chronic atrial fibrillation, unspecified: Secondary | ICD-10-CM | POA: Diagnosis not present

## 2020-11-03 LAB — VITAMIN B12: Vitamin B-12: 182 pg/mL (ref 180–914)

## 2020-11-03 LAB — FOLATE: Folate: 8.5 ng/mL (ref >5.9)

## 2020-11-03 LAB — SARS CORONAVIRUS 2 (TAT 6-24 HRS): SARS Coronavirus 2: NEGATIVE

## 2020-11-03 MED ORDER — QUETIAPINE FUMARATE 25 MG PO TABS: 25.0000 mg | ORAL_TABLET | Freq: Two times a day (BID) | ORAL | Status: DC

## 2020-11-03 MED ORDER — CYANOCOBALAMIN 1000 MCG/ML IJ SOLN
1000.0000 ug | Freq: Once | INTRAMUSCULAR | Status: AC
  Administered 2020-11-03: 1000 ug via INTRAMUSCULAR
  Filled 2020-11-03: qty 1

## 2020-11-03 MED ORDER — HALOPERIDOL LACTATE 5 MG/ML IJ SOLN: 2.0000 mg | Freq: Four times a day (QID) | INTRAMUSCULAR | Status: DC | PRN

## 2020-11-03 MED ORDER — ACETAMINOPHEN 325 MG PO TABS: 650.0000 mg | ORAL_TABLET | Freq: Four times a day (QID) | ORAL | Status: DC

## 2020-11-03 MED ORDER — QUETIAPINE FUMARATE 25 MG PO TABS: 25.0000 mg | ORAL_TABLET | Freq: Every day | ORAL | Status: DC

## 2020-11-03 MED ORDER — VITAMIN B-12 1000 MCG PO TABS: 1000.0000 ug | ORAL_TABLET | Freq: Every day | ORAL | Status: DC

## 2020-11-03 NOTE — NC FL2 (Signed)
[place] LEVEL OF CARE SCREENING TOOL     IDENTIFICATION  Patient Name: [name] Birthdate: [birth year] Sex: male Admission Date (Current Location): 11/01/2020  [facility] and [place] Number:  [employer] and Address:  [place] [address], [place]      Provider Number: [id number]  Attending Physician Name and Address:  Murlean Iba, MD  Relative Name and Phone Number:  Glennie Hawk (Legal Guardian)   [phone]    Current Level of Care: Other (Comment) (obs\) Recommended Level of Care: [place] Prior Approval Number:    Date Approved/Denied:   PASRR Number: [id number]  Discharge Plan: Domiciliary (Rest home)    Current Diagnoses: Patient Active Problem List   Diagnosis Date Noted  . Syncope 11/02/2020  . Macrocytic anemia 11/02/2020  . Left rib fracture 11/02/2020  . Scalp hematoma 11/02/2020  . Fall 11/01/2020  . Hygroma 09/10/2018  . 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 Chest pain 12/29/2013  . Atrial fibrillation ([place]) 12/29/2013  . VERTIGO 02/04/2010  . [name] DEFICIENCY 01/31/2010  . Memory loss 01/27/2010  . PARESTHESIA 01/27/2010  . Hyperlipidemia 11/02/2009  . CORONARY ARTERY DISEASE 11/02/2009  . SINUSITIS, ACUTE 11/02/2009  . EPISTAXIS 11/02/2009  . COLONIC POLYPS, HX OF 11/02/2009    Orientation RESPIRATION BLADDER Height & Weight     Self,Place,Time  Normal Continent Weight: 166 lb 10.7 oz (75.6 kg) Height:  5\' 8"  (172.7 cm)  BEHAVIORAL SYMPTOMS/MOOD NEUROLOGICAL BOWEL NUTRITION STATUS      Continent Diet (Heart healthy)  AMBULATORY STATUS COMMUNICATION OF NEEDS Skin   Limited Assist Verbally Normal                       Personal Care Assistance Level of Assistance  Bathing,Feeding,Dressing,Total care Bathing Assistance: Limited assistance Feeding assistance: Independent Dressing Assistance: Limited  assistance Total Care Assistance: Limited assistance   Functional Limitations Info  Sight,Hearing,Speech Sight Info: Adequate Hearing Info: Impaired Speech Info: Adequate    SPECIAL CARE FACTORS FREQUENCY  PT (By licensed PT),OT (By licensed OT)     PT Frequency: 3x/week OT Frequency: 3x/week            Contractures Contractures Info: Not present    Additional Factors Info  Code Status,Allergies Code Status Info: FULL Allergies Info: NKA           Current Medications (11/03/2020):  This is the current hospital active medication list Current Facility-Administered Medications  Medication Dose Route Frequency Provider Last Rate Last Admin  . acetaminophen (TYLENOL) tablet 650 mg  650 mg Oral Q6H PRN Adefeso, Oladapo, DO   650 mg at 11/02/20 1718  . atorvastatin (LIPITOR) tablet 20 mg  20 mg Oral q1800 Adefeso, Oladapo, DO   20 mg at 11/02/20 1719  . cyanocobalamin ((VITAMIN B-12)) injection 1,000 mcg  1,000 mcg Intramuscular Once Johnson, Clanford L, MD      . haloperidol lactate (HALDOL) injection 2 mg  2 mg Intravenous Q6H PRN Johnson, Clanford L, MD      . QUEtiapine (SEROQUEL) tablet 25 mg  25 mg Oral QHS Johnson, Clanford L, MD      . Rivaroxaban (XARELTO) tablet 15 mg  15 mg Oral Q supper Adefeso, Oladapo, DO   15 mg at 11/02/20 1719     Discharge Medications: Medication List    TAKE these medications   acetaminophen 325 MG tablet Commonly known as: TYLENOL  Take 2 tablets (650 mg total) by mouth every 6 (six) hours. What changed:   medication strength  how much to take  when to take this  reasons to take this   atorvastatin 20 MG tablet Commonly known as: LIPITOR Take 1 tablet (20 mg total) by mouth daily at 6 PM.   furosemide 20 MG tablet Commonly known as: LASIX Take 20 mg by mouth every other day.   loratadine 10 MG tablet Commonly known as: CLARITIN Take 10 mg by mouth daily.   midodrine 5 MG tablet Commonly known as:  PROAMATINE Take 5 mg by mouth 2 (two) times daily with a meal.   MIRALAX PO Take by mouth. Take as needed for constipation   potassium chloride 10 MEQ tablet Commonly known as: KLOR-CON Take 10 mEq by mouth every other day.   Rivaroxaban 15 MG Tabs tablet Commonly known as: XARELTO Take 15 mg by mouth daily with supper.   vitamin B-12 1000 MCG tablet Commonly known as: CYANOCOBALAMIN Take 1 tablet (1,000 mcg total) by mouth daily. Start taking on: November 04, 2020    Relevant Imaging Results:  Relevant Lab Results:   Additional Information SSN: [id number]  Ihor Gully, LCSW

## 2020-11-03 NOTE — Discharge Summary (Signed)
Physician Discharge Summary  Guy FrancoClaude M Dauphinais JXB:[id number] DOB: 04-13-1930 DOA: 11/01/2020  PCP: Avon GullyFanta, Tesfaye, MD Cardiologist: Branch  Admit date: 11/01/2020 Discharge date: 11/03/2020  Admitted From:  Home  Disposition: Home with home health (Returning to [facility])  Recommendations for Outpatient Follow-up:  1. Follow up with PCP in 1-2 weeks 2. Follow up with cardiology as scheduled.   Home Health: PT, SW   Discharge Condition: STABLE   CODE STATUS: FULL DIET: Heart Healthy    Brief Hospitalization Summary: Please see all hospital notes, images, labs for full details of the hospitalization. ADMISSION HPI:  87 y.o. male with medical history significant for CAD, dementia, hyperlipidemia, atrial fibrillation, bladder cancer who presents to the ED emergency department from local SNF with complain of passing out prior to arrival.  Patient states that he was sitting on a chair and he started to have a left shoulder pain, he states that he slid off the chair and passed out on the floor, he was not sure how long he passed out, but on waking up, he had difficulty in being able to get up, but he eventually was able to call for help.  He denied any bladder or bowel incontinence.  Of note, EMS report was completely different from patient's, per report, patient was dancing and fell injuring the left shoulder and left ribs.  Patient endorsed some intermittent left shoulder and left hip pain at baseline.  He denies chest pain, shortness of breath, back pain, neck pain, nausea, vomiting or abdominal pain.  ED Course:  In the emergency department, temperature was 97.19F, BP was 151/82, other vital signs were within normal range.  Work-up in the ED showed macrocytic anemia, normal BMP except for sodium 134, BUN 29.  Urinalysis was negative for UTI, troponin x2 was negative. Left ribs and chest x-ray showed nondisplaced anterolateral seventh rib fracture with no acute pulmonary complication.   Left shoulder showed no fracture or subluxation of the left shoulder.  CT head without contrast showed small left parietal scalp hematoma.  No acute intracranial abnormality.  No skull fracture.  Cervical spine showed multilevel degenerative change throughout the cervical spine without acute fracture or subluxation.   Left hip x-ray showed minor left hip osteoarthritis without acute fracture.  Hospitalist was asked to admit patient for further evaluation and management.  Hospital Course  Left seventh rib fracture and scalp hematoma possibly due to accidental fall Left ribs and chest x-ray showed nondisplaced anterolateral seventh rib fracture  CT head without contrast showed small left parietal scalp hematoma. Continue Tylenol every 6 hours for pain symptoms until no longer needed.  Continue fall precaution and neurochecks PT/OT was consulted, said patient could return to [facility], home health has been ordered for patient.   Syncope ruled out -On repeat of history, patient acknowledged mechanical fall backwards after tripping on an object while dancing, denied frank any L.O.C. -2d echo was ordered at time of admit, reviewed with EF of 40% which is unchanged from prior exams, outpatient follow up with cardiology recommended.  He is seen by Dr. Wyline MoodBranch.    Macrocytic anemia Folate and vitamin [name] levels pending -[name] level is low normal, ordered [name] injection x 1 and oral [name] replacement.   Chronic atrial fibrillation -Continue Xarelto -Currently rate controlled  Hyperlipidemia Continue Lipitor  DVT prophylaxis: Xarelto Code Status: Full  Discharge Diagnoses:  Principal Problem:   Fall Active Problems:   Hyperlipidemia   Atrial fibrillation (HCC)   Syncope  Macrocytic anemia   Left rib fracture   Scalp hematoma   Discharge Instructions:  Allergies as of 11/03/2020   No Known Allergies     Medication List    TAKE these medications   acetaminophen 325 MG  tablet Commonly known as: TYLENOL Take 2 tablets (650 mg total) by mouth every 6 (six) hours. What changed:   medication strength  how much to take  when to take this  reasons to take this   atorvastatin 20 MG tablet Commonly known as: LIPITOR Take 1 tablet (20 mg total) by mouth daily at 6 PM.   furosemide 20 MG tablet Commonly known as: LASIX Take 20 mg by mouth every other day.   loratadine 10 MG tablet Commonly known as: CLARITIN Take 10 mg by mouth daily.   midodrine 5 MG tablet Commonly known as: PROAMATINE Take 5 mg by mouth 2 (two) times daily with a meal.   MIRALAX PO Take by mouth. Take as needed for constipation   potassium chloride 10 MEQ tablet Commonly known as: KLOR-CON Take 10 mEq by mouth every other day.   Rivaroxaban 15 MG Tabs tablet Commonly known as: XARELTO Take 15 mg by mouth daily with supper.   vitamin B-12 1000 MCG tablet Commonly known as: CYANOCOBALAMIN Take 1 tablet (1,000 mcg total) by mouth daily. Start taking on: November 04, 2020       Follow-up Information    Rosita Fire, MD. Schedule an appointment as soon as possible for a visit in 1 week(s).   Specialty: Internal Medicine Contact information: [place] [address] [phone]              No Known Allergies Allergies as of 11/03/2020   No Known Allergies     Medication List    TAKE these medications   acetaminophen 325 MG tablet Commonly known as: TYLENOL Take 2 tablets (650 mg total) by mouth every 6 (six) hours. What changed:   medication strength  how much to take  when to take this  reasons to take this   atorvastatin 20 MG tablet Commonly known as: LIPITOR Take 1 tablet (20 mg total) by mouth daily at 6 PM.   furosemide 20 MG tablet Commonly known as: LASIX Take 20 mg by mouth every other day.   loratadine 10 MG tablet Commonly known as: CLARITIN Take 10 mg by mouth daily.   midodrine 5 MG tablet Commonly  known as: PROAMATINE Take 5 mg by mouth 2 (two) times daily with a meal.   MIRALAX PO Take by mouth. Take as needed for constipation   potassium chloride 10 MEQ tablet Commonly known as: KLOR-CON Take 10 mEq by mouth every other day.   Rivaroxaban 15 MG Tabs tablet Commonly known as: XARELTO Take 15 mg by mouth daily with supper.   vitamin B-12 1000 MCG tablet Commonly known as: CYANOCOBALAMIN Take 1 tablet (1,000 mcg total) by mouth daily. Start taking on: November 04, 2020       Procedures/Studies: DG Ribs Unilateral W/Chest Left  Result Date: 11/01/2020 CLINICAL DATA:  Fall today while dancing, left shoulder and rib pain. EXAM: LEFT RIBS AND CHEST - 3+ VIEW COMPARISON:  None. FINDINGS: Nondisplaced anterolateral seventh rib fracture. There is no evidence of pneumothorax or pleural effusion. Both lungs are clear. Upper normal heart size. Aortic atherosclerosis. Mediastinal contours are within normal limits. IMPRESSION: Nondisplaced anterolateral seventh rib fracture. No acute pulmonary complication. Electronically Signed   By: Keith Rake  M.D.   On: 11/01/2020 17:44   CT Head Wo Contrast  Result Date: 11/01/2020 CLINICAL DATA:  Head trauma, minor (Age >= 65y) Patient reports fall while dancing at nursing facility. Patient reports syncope striking left side of head on floor. EXAM: CT HEAD WITHOUT CONTRAST TECHNIQUE: Contiguous axial images were obtained from the base of the skull through the vertex without intravenous contrast. COMPARISON:  Head CT 06/25/2020 FINDINGS: Brain: Stable degree of atrophy and chronic small vessel ischemia from prior exam. No intracranial hemorrhage, mass effect, or midline shift. No hydrocephalus. The basilar cisterns are patent. No evidence of territorial infarct or acute ischemia. No extra-axial or intracranial fluid collection. Vascular: No hyperdense vessel. Skull: No fracture or focal lesion. Sinuses/Orbits: Paranasal sinuses and mastoid air cells are  clear. The visualized orbits are unremarkable. Bilateral cataract resection. Other: Small left parietal scalp hematoma. IMPRESSION: 1. Small left parietal scalp hematoma. No acute intracranial abnormality. No skull fracture. 2. Stable atrophy and chronic small vessel ischemia. Electronically Signed   By: Keith Rake M.D.   On: 11/01/2020 17:05   CT Cervical Spine Wo Contrast  Result Date: 11/01/2020 CLINICAL DATA:  Neck trauma (Age >= 65y) Patient reports fall while dancing at nursing facility. Patient reports syncope striking left side of head on floor. EXAM: CT CERVICAL SPINE WITHOUT CONTRAST TECHNIQUE: Multidetector CT imaging of the cervical spine was performed without intravenous contrast. Multiplanar CT image reconstructions were also generated. COMPARISON:  Cervical spine CT 05/26/2020 FINDINGS: Alignment: Stable from prior exam, no traumatic subluxation. Trace anterolisthesis of C2 on C3, retrolisthesis of C3 on C4, and anterolisthesis of C7 on T1, unchanged. Skull base and vertebrae: No acute fracture. Vertebral body heights are maintained. The dens and skull base are intact. Soft tissues and spinal canal: No prevertebral fluid or swelling. No visible canal hematoma. Disc levels: Diffuse degenerative disc disease with disc space narrowing and endplate spurring. Scattered facet hypertrophy. No significant change from prior exam Upper chest: No acute or unexpected findings. IMPRESSION: Multilevel degenerative change throughout the cervical spine without acute fracture or subluxation. Electronically Signed   By: Keith Rake M.D.   On: 11/01/2020 17:12   DG Shoulder Left  Result Date: 11/01/2020 CLINICAL DATA:  Fall today while dancing, left shoulder and rib pain. EXAM: LEFT SHOULDER - 2+ VIEW COMPARISON:  None. FINDINGS: There is no evidence of fracture or dislocation. Minor acromioclavicular and glenohumeral degenerative change. Soft tissues are unremarkable. IMPRESSION: No fracture or  subluxation of the left shoulder. Electronically Signed   By: Keith Rake M.D.   On: 11/01/2020 17:43   ECHOCARDIOGRAM COMPLETE  Result Date: 11/02/2020    ECHOCARDIOGRAM REPORT   Patient Name:   BRENIN HEIDELBERGER Date of Exam: 11/02/2020 Medical Rec #:  [id number]           Height:       68.0 in Accession #:    [id number]          Weight:       166.7 lb Date of Birth:  07-Jul-1930          BSA:          1.891 m Patient Age:    53 years            BP:           113/57 mmHg Patient Gender: M                   HR:  96 bpm. Exam Location:  Inpatient Procedure: 2D Echo, Cardiac Doppler, Color Doppler and Intracardiac            Opacification Agent Indications:    CHF  History:        Patient has prior history of Echocardiogram examinations, most                 recent 09/10/2018. CAD, COPD; Risk Factors:Hypertension,                 Dyslipidemia and Diabetes.  Sonographer:    Maudry Mayhew MHA, RDMS, RVT, RDCS Referring Phys: [id number] [facility] ADEFESO  Sonographer Comments: Image acquisition challenging due to patient body habitus and Image acquisition challenging due to respiratory motion. IMPRESSIONS  1. Technically difficult assessment of LV function, limited visualization of endocardium even with the use of echocontrast. LVEF roughly 40%. Left ventricular ejection fraction, by estimation, is 40%. The left ventricle has normal function. Left ventricular endocardial border not optimally defined to evaluate regional wall motion. Left ventricular diastolic parameters are indeterminate.  2. Right ventricular systolic function was not well visualized. The right ventricular size is not well visualized.  3. Left atrial size was severely dilated.  4. The mitral valve was not well visualized. No evidence of mitral valve regurgitation. No evidence of mitral stenosis.  5. The aortic valve is tricuspid. There is mild calcification of the aortic valve. There is mild thickening of the aortic valve. Aortic valve  regurgitation is mild. No aortic stenosis is present. FINDINGS  Left Ventricle: Technically difficult assessment of LV function, limited visualization of endocardium even with the use of echocontrast. LVEF roughly 40%. Left ventricular ejection fraction, by estimation, is 40%. The left ventricle has normal function.  Left ventricular endocardial border not optimally defined to evaluate regional wall motion. Definity contrast agent was given IV to delineate the left ventricular endocardial borders. The left ventricular internal cavity size was normal in size. There is no left ventricular hypertrophy. Left ventricular diastolic parameters are indeterminate. Right Ventricle: RV poorly visualized, grossly appears normal in size and function. The right ventricular size is not well visualized. Right vetricular wall thickness was not assessed. Right ventricular systolic function was not well visualized. Left Atrium: Left atrial size was severely dilated. Right Atrium: Right atrial size was not well visualized. Pericardium: There is no evidence of pericardial effusion. Mitral Valve: The mitral valve was not well visualized. No evidence of mitral valve regurgitation. No evidence of mitral valve stenosis. Tricuspid Valve: The tricuspid valve is normal in structure. Tricuspid valve regurgitation is not demonstrated. No evidence of tricuspid stenosis. Aortic Valve: The aortic valve is tricuspid. There is mild calcification of the aortic valve. There is mild thickening of the aortic valve. There is mild aortic valve annular calcification. Aortic valve regurgitation is mild. Aortic regurgitation PHT measures 299 msec. No aortic stenosis is present. Aortic valve mean gradient measures 1.0 mmHg. Aortic valve peak gradient measures 1.6 mmHg. Aortic valve area, by VTI measures 3.31 cm. Pulmonic Valve: The pulmonic valve was not well visualized. Pulmonic valve regurgitation is mild. No evidence of pulmonic stenosis. Aorta: The aortic  root is normal in size and structure. Pulmonary Artery: Indeterminant PASP, inadequate TR jet. Venous: The inferior vena cava was not well visualized. IAS/Shunts: The interatrial septum was not well visualized.  LEFT VENTRICLE PLAX 2D LVIDd:         4.80 cm LVIDs:         3.40 cm LV PW:  0.80 cm LV IVS:        0.60 cm LVOT diam:     2.20 cm LV SV:         44 LV SV Index:   23 LVOT Area:     3.80 cm  LV Volumes (MOD) LV vol d, MOD A2C: 45.3 ml LV vol d, MOD A4C: 59.7 ml LV vol s, MOD A2C: 33.8 ml LV vol s, MOD A4C: 37.9 ml LV SV MOD A2C:     11.5 ml LV SV MOD A4C:     59.7 ml LV SV MOD BP:      15.3 ml RIGHT VENTRICLE RV S prime:     6.96 cm/s TAPSE (M-mode): 1.3 cm LEFT ATRIUM              Index       RIGHT ATRIUM           Index LA diam:        4.40 cm  2.33 cm/m  RA Area:     20.30 cm LA Vol (A2C):   73.7 ml  38.97 ml/m RA Volume:   57.10 ml  30.19 ml/m LA Vol (A4C):   106.0 ml 56.04 ml/m LA Biplane Vol: 92.2 ml  48.75 ml/m  AORTIC VALVE AV Area (Vmax):    3.58 cm AV Area (Vmean):   3.09 cm AV Area (VTI):     3.31 cm AV Vmax:           63.00 cm/s AV Vmean:          46.300 cm/s AV VTI:            0.132 m AV Peak Grad:      1.6 mmHg AV Mean Grad:      1.0 mmHg LVOT Vmax:         59.40 cm/s LVOT Vmean:        37.600 cm/s LVOT VTI:          0.115 m LVOT/AV VTI ratio: 0.87 AI PHT:            299 msec  AORTA Ao Root diam: 3.30 cm MITRAL VALVE               TRICUSPID VALVE MV Area (PHT): 4.10 cm    TR Peak grad:   17.5 mmHg MV Decel Time: 185 msec    TR Vmax:        209.00 cm/s MV E velocity: 87.40 cm/s                            SHUNTS                            Systemic VTI:  0.12 m                            Systemic Diam: 2.20 cm Carlyle Dolly MD Electronically signed by Carlyle Dolly MD Signature Date/Time: 11/02/2020/4:08:50 PM    Final    DG Hip Unilat W or Wo Pelvis 2-3 Views Left  Result Date: 11/01/2020 CLINICAL DATA:  Fall today while dancing. EXAM: DG HIP (WITH OR WITHOUT PELVIS) 2-3V  LEFT COMPARISON:  None. FINDINGS: The cortical margins of the bony pelvis and left hip are intact. No fracture. Pubic symphysis and sacroiliac joints are congruent. Pubic rami are intact. Minor left hip  degenerative change with acetabular spurring. IMPRESSION: Minor left hip osteoarthritis without acute fracture. Electronically Signed   By: Keith Rake M.D.   On: 11/01/2020 17:45      Subjective: Pt resting comfortably, no complaints. He has been ambulating in room and reports that his rib pain is manageable and he has been able to breathe with no difficulties.   He wants to go home   Discharge Exam: Vitals:   11/03/20 0509 11/03/20 1300  BP: (!) 110/55 (!) 111/57  Pulse: 78 72  Resp: 20 19  Temp: 98.6 F (37 C) 98.6 F (37 C)  SpO2: 97% 97%   Vitals:   11/02/20 1222 11/02/20 2024 11/03/20 0509 11/03/20 1300  BP: 97/68 116/68 (!) 110/55 (!) 111/57  Pulse:  91 78 72  Resp: 16 20 20 19   Temp: 98 F (36.7 C) 98.1 F (36.7 C) 98.6 F (37 C) 98.6 F (37 C)  TempSrc: Oral   Oral  SpO2: 96% 100% 97% 97%  Weight:      Height:       General: Pt is alert, awake, not in acute distress.  Cardiovascular: normal S1/S2 +, no rubs, no gallops.  Respiratory: CTA bilaterally, no wheezing, no rhonchi, no increased work of breathing.  Abdominal: Soft, NT, ND, bowel sounds + Extremities: no edema, no cyanosis.   Neurological: nonfocal exam.    The results of significant diagnostics from this hospitalization (including imaging, microbiology, ancillary and laboratory) are listed below for reference.     Microbiology: Recent Results (from the past 240 hour(s))  MRSA PCR Screening     Status: None   Collection Time: 11/02/20  9:30 PM   Specimen: Nasal Mucosa; Nasopharyngeal  Result Value Ref Range Status   MRSA by PCR NEGATIVE NEGATIVE Final    Comment:        The GeneXpert MRSA Assay (FDA approved for NASAL specimens only), is one component of a comprehensive MRSA  colonization surveillance program. It is not intended to diagnose MRSA infection nor to guide or monitor treatment for MRSA infections. Performed at [facility], [address] [place], [place] [address]   SARS CORONAVIRUS 2 (TAT 6-24 HRS) Nasopharyngeal Nasopharyngeal Swab     Status: None   Collection Time: 11/02/20  9:30 PM   Specimen: Nasopharyngeal Swab  Result Value Ref Range Status   SARS Coronavirus 2 NEGATIVE NEGATIVE Final    Comment: (NOTE) SARS-CoV-2 target nucleic acids are NOT DETECTED.  The SARS-CoV-2 RNA is generally detectable in upper and lower respiratory specimens during the acute phase of infection. Negative results do not preclude SARS-CoV-2 infection, do not rule out co-infections with other pathogens, and should not be used as the sole basis for treatment or other patient management decisions. Negative results must be combined with clinical observations, patient history, and epidemiological information. The expected result is Negative.  Fact Sheet for Patients: SugarRoll.be  Fact Sheet for Healthcare Providers: https://www.woods-mathews.com/  This test is not yet approved or cleared by the [place] FDA and  has been authorized for detection and/or diagnosis of SARS-CoV-2 by FDA under an Emergency Use Authorization (EUA). This EUA will remain  in effect (meaning this test can be used) for the duration of the COVID-19 declaration under Se ction 564(b)(1) of the Act, 21 U.S.C. section 360bbb-3(b)(1), unless the authorization is terminated or revoked sooner.  Performed at [facility], [place] [address], [place], [place] [address]      Labs: BNP (last 3 results) No  results for input(s): BNP in the last 8760 hours. Basic Metabolic Panel: Recent Labs  Lab 11/01/20 1728 11/02/20 0414  NA 134* 138  K 4.1 4.0  CL 102 103  CO2 26 29  GLUCOSE 99 97  BUN 29* 23  CREATININE 1.24 1.18  CALCIUM 8.7*  9.1  MG  --  2.0  PHOS  --  3.7   Liver Function Tests: Recent Labs  Lab 11/01/20 1728 11/02/20 0414  AST 18 17  ALT 10 10  ALKPHOS 82 83  BILITOT 0.7 1.1  PROT 6.3* 6.1*  ALBUMIN 3.5 3.4*   No results for input(s): LIPASE, AMYLASE in the last 168 hours. No results for input(s): AMMONIA in the last 168 hours. CBC: Recent Labs  Lab 11/01/20 1728 11/02/20 0414  WBC 5.8 5.9  NEUTROABS 3.9  --   HGB 12.4* 12.5*  HCT 38.0* 38.2*  MCV 100.3* 99.5  PLT 169 153   Cardiac Enzymes: No results for input(s): CKTOTAL, CKMB, CKMBINDEX, TROPONINI in the last 168 hours. BNP: Invalid input(s): POCBNP CBG: No results for input(s): GLUCAP in the last 168 hours. D-Dimer No results for input(s): DDIMER in the last 72 hours. Hgb A1c No results for input(s): HGBA1C in the last 72 hours. Lipid Profile No results for input(s): CHOL, HDL, LDLCALC, TRIG, CHOLHDL, LDLDIRECT in the last 72 hours. Thyroid function studies No results for input(s): TSH, T4TOTAL, T3FREE, THYROIDAB in the last 72 hours.  Invalid input(s): FREET3 Anemia work up Recent Labs    11/03/20 0436  VITAMINB12 182  FOLATE 8.5   Urinalysis    Component Value Date/Time   COLORURINE YELLOW 11/01/2020 [place] 11/01/2020 2240   LABSPEC 1.013 11/01/2020 2240   PHURINE 7.0 11/01/2020 2240   GLUCOSEU NEGATIVE 11/01/2020 2240   GLUCOSEU NEGATIVE 01/27/2010 1221   HGBUR NEGATIVE 11/01/2020 2240   [place] 11/01/2020 2240   KETONESUR NEGATIVE 11/01/2020 2240   PROTEINUR NEGATIVE 11/01/2020 2240   UROBILINOGEN 1.0 01/27/2010 1221   NITRITE NEGATIVE 11/01/2020 2240   LEUKOCYTESUR NEGATIVE 11/01/2020 2240   Sepsis Labs Invalid input(s): PROCALCITONIN,  WBC,  LACTICIDVEN Microbiology Recent Results (from the past 240 hour(s))  MRSA PCR Screening     Status: None   Collection Time: 11/02/20  9:30 PM   Specimen: Nasal Mucosa; Nasopharyngeal  Result Value Ref Range Status   MRSA by PCR  NEGATIVE NEGATIVE Final    Comment:        The GeneXpert MRSA Assay (FDA approved for NASAL specimens only), is one component of a comprehensive MRSA colonization surveillance program. It is not intended to diagnose MRSA infection nor to guide or monitor treatment for MRSA infections. Performed at [facility], [address] [place], [place] [address]   SARS CORONAVIRUS 2 (TAT 6-24 HRS) Nasopharyngeal Nasopharyngeal Swab     Status: None   Collection Time: 11/02/20  9:30 PM   Specimen: Nasopharyngeal Swab  Result Value Ref Range Status   SARS Coronavirus 2 NEGATIVE NEGATIVE Final    Comment: (NOTE) SARS-CoV-2 target nucleic acids are NOT DETECTED.  The SARS-CoV-2 RNA is generally detectable in upper and lower respiratory specimens during the acute phase of infection. Negative results do not preclude SARS-CoV-2 infection, do not rule out co-infections with other pathogens, and should not be used as the sole basis for treatment or other patient management decisions. Negative results must be combined with clinical observations, patient history, and epidemiological information. The expected result is Negative.  Fact Sheet for  Patients: SugarRoll.be  Fact Sheet for Healthcare Providers: https://www.woods-mathews.com/  This test is not yet approved or cleared by the [place] FDA and  has been authorized for detection and/or diagnosis of SARS-CoV-2 by FDA under an Emergency Use Authorization (EUA). This EUA will remain  in effect (meaning this test can be used) for the duration of the COVID-19 declaration under Se ction 564(b)(1) of the Act, 21 U.S.C. section 360bbb-3(b)(1), unless the authorization is terminated or revoked sooner.  Performed at [facility], [place] [address] [place], [place] [address]    Time coordinating discharge:   SIGNED:  Irwin Brakeman, MD  [facility] 11/03/2020, 3:42 PM How to contact  the [facility] Attending or Consulting provider [place] or covering provider during after hours [place], for this patient?  1. Check the care team in [facility] and look for a) attending/consulting TR[facility] provider listed and b) the [facility] team listed 2. Log into [email or web address] and use [place]'s universal password to access. If you do not have the password, please contact the hospital operator. 3. Locate the [facility] provider you are looking for under [facility] and page to a number that you can be directly reached. 4. If you still have difficulty reaching the provider, please page the [facility] (Director on Call) for the Hospitalists listed on amion for assistance.

## 2020-11-03 NOTE — TOC Transition Note (Signed)
Transition of Care [facility]) - CM/SW Discharge Note   Patient Details  Name: ABRIAN HANOVER MRN: [id number] Date of Birth: [birth year]  Transition of Care [facility]) CM/SW Contact:  Ihor Gully, LCSW Phone Number: 11/03/2020, 4:21 PM   Clinical Narrative:    Patient at baseline. Guardian and facility agreeable to return to ALF. TOC signing off.    Final next level of care: Assisted Living Barriers to Discharge: No Barriers Identified   Patient Goals and CMS Choice Patient states their goals for this hospitalization and ongoing recovery are:: Go to SNF CMS Medicare.gov Compare Post Acute Care list provided to:: Legal Guardian Choice offered to / list presented to : [facility] POA / Guardian  Discharge Placement                Patient to be transferred to facility by: [facility] Name of family member notified: carrie dickerson Patient and family notified of of transfer: 11/03/20  Discharge Plan and Services In-house Referral: Clinical Social Work Discharge Planning Services: [employer] Consult Post Acute Care Choice: [place]          DME Arranged: N/A DME Agency: NA       HH Arranged: NA HH Agency: NA        Social Determinants of Health (SDOH) Interventions     Readmission Risk Interventions No flowsheet data found.

## 2020-11-03 NOTE — Progress Notes (Signed)
Patient discharged to [facility], picked up by facility . IV removed from RFA by patient earleir in shift. Transferred to car without incident with personal belongings

## 2020-11-03 NOTE — Plan of Care (Signed)

## 2020-11-03 NOTE — Care Management Obs Status (Signed)
[facility] NOTIFICATION   Patient Details  Name: [name] MRN: [id number] Date of Birth: 1930-03-12   Medicare Observation Status Notification Given:  Yes    Tommy Medal 11/03/2020, 10:49 AM

## 2020-11-03 NOTE — Discharge Instructions (Signed)
Fall Prevention in the Home, Adult Falls can cause injuries and can affect people from all age groups. There are many simple things that you can do to make your home safe and to help prevent falls. Ask for help when making these changes, if needed. What actions can I take to prevent falls? General instructions  Use good lighting in all rooms. Replace any light bulbs that burn out.  Turn on lights if it is dark. Use night-lights.  Place frequently used items in easy-to-reach places. Lower the shelves around your home if necessary.  Set up furniture so that there are clear paths around it. Avoid moving your furniture around.  Remove throw rugs and other tripping hazards from the floor.  Avoid walking on wet floors.  Fix any uneven floor surfaces.  Add color or contrast paint or tape to grab bars and handrails in your home. Place contrasting color strips on the first and last steps of stairways.  When you use a stepladder, make sure that it is completely opened and that the sides are firmly locked. Have someone hold the ladder while you are using it. Do not climb a closed stepladder.  Be aware of any and all pets. What can I do in the bathroom?  Keep the floor dry. Immediately clean up any water that spills onto the floor.  Remove soap buildup in the tub or shower on a regular basis.  Use non-skid mats or decals on the floor of the tub or shower.  Attach bath mats securely with double-sided, non-slip rug tape.  If you need to sit down while you are in the shower, use a plastic, non-slip stool.  Install grab bars by the toilet and in the tub and shower. Do not use towel bars as grab bars.      What can I do in the bedroom?  Make sure that a bedside light is easy to reach.  Do not use oversized bedding that drapes onto the floor.  Have a firm chair that has side arms to use for getting dressed. What can I do in the kitchen?  Clean up any spills right away.  If you need to  reach for something above you, use a sturdy step stool that has a grab bar.  Keep electrical cables out of the way.  Do not use floor polish or wax that makes floors slippery. If you must use wax, make sure that it is non-skid floor wax. What can I do in the stairways?  Do not leave any items on the stairs.  Make sure that you have a light switch at the top of the stairs and the bottom of the stairs. Have them installed if you do not have them.  Make sure that there are handrails on both sides of the stairs. Fix handrails that are broken or loose. Make sure that handrails are as long as the stairways.  Install non-slip stair treads on all stairs in your home.  Avoid having throw rugs at the top or bottom of stairways, or secure the rugs with carpet tape to prevent them from moving.  Choose a carpet design that does not hide the edge of steps on the stairway.  Check any carpeting to make sure that it is firmly attached to the stairs. Fix any carpet that is loose or worn. What can I do on the outside of my home?  Use bright outdoor lighting.  Regularly repair the edges of walkways and driveways and fix any cracks.  Remove high doorway thresholds.  Trim any shrubbery on the main path into your home.  Regularly check that handrails are securely fastened and in good repair. Both sides of any steps should have handrails.  Install guardrails along the edges of any raised decks or porches.  Clear walkways of debris and clutter, including tools and rocks.  Have leaves, snow, and ice cleared regularly.  Use sand or salt on walkways during winter months.  In the garage, clean up any spills right away, including grease or oil spills. What other actions can I take?  Wear closed-toe shoes that fit well and support your feet. Wear shoes that have rubber soles or low heels.  Use mobility aids as needed, such as canes, walkers, scooters, and crutches.  Review your medicines with your  health care provider. Some medicines can cause dizziness or changes in blood pressure, which increase your risk of falling. Talk with your health care provider about other ways that you can decrease your risk of falls. This may include working with a physical therapist or trainer to improve your strength, balance, and endurance. Where to find more information  Centers for Disease Control and Prevention, STEADI: WebmailGuide.co.za  [employer] on Aging: BrainJudge.co.uk Contact a health care provider if:  You are afraid of falling at home.  You feel weak, drowsy, or dizzy at home.  You fall at home. Summary  There are many simple things that you can do to make your home safe and to help prevent falls.  Ways to make your home safe include removing tripping hazards and installing grab bars in the bathroom.  Ask for help when making these changes in your home. This information is not intended to replace advice given to you by your health care provider. Make sure you discuss any questions you have with your health care provider. Document Revised: 06/08/2017 Document Reviewed: 02/08/2017 Elsevier Patient Education  2021 [place].     Rib Fracture  A rib fracture is a break or crack in one of the bones of the ribs. The ribs are like a cage that goes around your upper chest. A broken or cracked rib is often painful, but most do not cause other problems. Most rib fractures usually heal on their own in 1-3 months. What are the causes?  Doing movements over and over again with a lot of force, such as pitching a baseball or having a very bad cough.  A direct hit to the chest.  Cancer that has spread to the bones. What are the signs or symptoms?  Pain when you breathe in or cough.  Pain when someone presses on the injured area.  Feeling short of breath. How is this treated? Treatment depends on how bad the fracture is. In general:  Most rib fractures usually  heal on their own in 1-3 months.  Healing may take longer if you have a cough or are doing activities that make the injury worse.  While you heal, you may be given medicines to control pain.  You will also be taught deep breathing exercises.  Very bad injuries may require a stay at the hospital or surgery. Follow these instructions at home: Managing pain, stiffness, and swelling  If told, put ice on the injured area. To do this: ? Put ice in a plastic bag. ? Place a towel between your skin and the bag. ? Leave the ice on for 20 minutes, 2-3 times a day. ? Take off the ice if your skin  turns bright red. This is very important. If you cannot feel pain, heat, or cold, you have a greater risk of damage to the area.  Take over-the-counter and prescription medicines only as told by your doctor. Activity  Avoid activities that cause pain to the injured area. Protect your injured area.  Slowly increase activity as told by your doctor. General instructions  Do deep breathing exercises as told by your doctor. You may be told to: ? Take deep breaths many times a day. ? Cough several times a day while hugging a pillow. ? Use a device (incentive spirometer) to do deep breathing many times a day.  Drink enough fluid to keep your pee (urine) clear or pale yellow.  Do not wear a rib belt or binder.  Keep all follow-up visits. Contact a doctor if:  You have a fever. Get help right away if:  You have trouble breathing.  You are short of breath.  You cannot stop coughing.  You cough up thick or bloody spit.  You feel like you may vomit (nauseous), vomit, or have belly (abdominal) pain.  Your pain gets worse and medicine does not help. These symptoms may be an emergency. Get help right away. Call your local emergency services (911 in the [place]  Do not wait to see if the symptoms will go away.  Do not drive yourself to the hospital. Summary  A rib fracture is a break or crack  in one of the bones of the ribs.  Apply ice to the injured area and take medicines for pain as told by your doctor.  Take deep breaths and cough several times a day. Hug a pillow every time you cough. This information is not intended to replace advice given to you by your health care provider. Make sure you discuss any questions you have with your health care provider. Document Revised: 10/17/2019 Document Reviewed: 10/17/2019 Elsevier Patient Education  2021 Elsevier Inc.    IMPORTANT INFORMATION: PAY CLOSE ATTENTION   PHYSICIAN DISCHARGE INSTRUCTIONS  Follow with Primary care provider  Rosita Fire, MD  and other consultants as instructed by your Hospitalist Physician  [place] IF SYMPTOMS COME BACK, WORSEN OR NEW PROBLEM DEVELOPS   Please note: You were cared for by a hospitalist during your hospital stay. Every effort will be made to forward records to your primary care provider.  You can request that your primary care provider send for your hospital records if they have not received them.  Once you are discharged, your primary care physician will handle any further medical issues. Please note that NO REFILLS for any discharge medications will be authorized once you are discharged, as it is imperative that you return to your primary care physician (or establish a relationship with a primary care physician if you do not have one) for your post hospital discharge needs so that they can reassess your need for medications and monitor your lab values.  Please get a complete blood count and chemistry panel checked by your Primary MD at your next visit, and again as instructed by your Primary MD.  Get Medicines reviewed and adjusted: Please take all your medications with you for your next visit with your Primary MD  Laboratory/radiological data: Please request your Primary MD to go over all hospital tests and procedure/radiological results at the  follow up, please ask your primary care provider to get all Hospital records sent to his/her office.  In some cases, they  will be blood work, cultures and biopsy results pending at the time of your discharge. Please request that your primary care provider follow up on these results.  If you are diabetic, please bring your blood sugar readings with you to your follow up appointment with primary care.    Please call and make your follow up appointments as soon as possible.    Also Note the following: If you experience worsening of your admission symptoms, develop shortness of breath, life threatening emergency, suicidal or homicidal thoughts you must seek medical attention immediately by calling 911 or calling your MD immediately  if symptoms less severe.  You must read complete instructions/literature along with all the possible adverse reactions/side effects for all the Medicines you take and that have been prescribed to you. Take any new Medicines after you have completely understood and accpet all the possible adverse reactions/side effects.   Do not drive when taking Pain medications or sleeping medications (Benzodiazepines)  Do not take more than prescribed Pain, Sleep and Anxiety Medications. It is not advisable to combine anxiety,sleep and pain medications without talking with your primary care practitioner  Special Instructions: If you have smoked or chewed Tobacco  in the last 2 yrs please stop smoking, stop any regular Alcohol  and or any Recreational drug use.  Wear Seat belts while driving.  Do not drive if taking any narcotic, mind altering or controlled substances or recreational drugs or alcohol.

## 2020-11-03 NOTE — Progress Notes (Addendum)
Patient presents with exit seeking behavior at this time. Pacing in halls looking for his shoes . Patient redirected by staff back to room , Hard to redirect patient at this time as he is adamant that he brought shoes with him No shoes in patient room. Notified Dr. Wynetta Emery of patient behavior

## 2020-11-05 DIAGNOSIS — R55 Syncope and collapse: Secondary | ICD-10-CM | POA: Diagnosis not present

## 2020-11-05 DIAGNOSIS — W19XXXD Unspecified fall, subsequent encounter: Secondary | ICD-10-CM | POA: Diagnosis not present

## 2020-11-05 DIAGNOSIS — S2232XD Fracture of one rib, left side, subsequent encounter for fracture with routine healing: Secondary | ICD-10-CM | POA: Diagnosis not present

## 2020-11-05 DIAGNOSIS — F0390 Unspecified dementia without behavioral disturbance: Secondary | ICD-10-CM | POA: Diagnosis not present

## 2020-11-08 ENCOUNTER — Other Ambulatory Visit (HOSPITAL_COMMUNITY): Payer: Self-pay | Admitting: Internal Medicine

## 2020-11-08 DIAGNOSIS — D6489 Other specified anemias: Secondary | ICD-10-CM | POA: Diagnosis not present

## 2020-11-08 DIAGNOSIS — I482 Chronic atrial fibrillation, unspecified: Secondary | ICD-10-CM | POA: Diagnosis not present

## 2020-11-08 DIAGNOSIS — F0390 Unspecified dementia without behavioral disturbance: Secondary | ICD-10-CM | POA: Diagnosis not present

## 2020-11-08 DIAGNOSIS — M1612 Unilateral primary osteoarthritis, left hip: Secondary | ICD-10-CM | POA: Diagnosis not present

## 2020-11-08 DIAGNOSIS — I2510 Atherosclerotic heart disease of native coronary artery without angina pectoris: Secondary | ICD-10-CM | POA: Diagnosis not present

## 2020-11-08 DIAGNOSIS — W19XXXA Unspecified fall, initial encounter: Secondary | ICD-10-CM

## 2020-11-08 DIAGNOSIS — E785 Hyperlipidemia, unspecified: Secondary | ICD-10-CM | POA: Diagnosis not present

## 2020-11-08 DIAGNOSIS — M6281 Muscle weakness (generalized): Secondary | ICD-10-CM | POA: Diagnosis not present

## 2020-11-08 DIAGNOSIS — S0003XD Contusion of scalp, subsequent encounter: Secondary | ICD-10-CM | POA: Diagnosis not present

## 2020-11-08 DIAGNOSIS — S2232XD Fracture of one rib, left side, subsequent encounter for fracture with routine healing: Secondary | ICD-10-CM | POA: Diagnosis not present

## 2020-11-09 DIAGNOSIS — Z20828 Contact with and (suspected) exposure to other viral communicable diseases: Secondary | ICD-10-CM | POA: Diagnosis not present

## 2020-11-09 DIAGNOSIS — D6489 Other specified anemias: Secondary | ICD-10-CM | POA: Diagnosis not present

## 2020-11-09 DIAGNOSIS — I482 Chronic atrial fibrillation, unspecified: Secondary | ICD-10-CM | POA: Diagnosis not present

## 2020-11-09 DIAGNOSIS — S0003XD Contusion of scalp, subsequent encounter: Secondary | ICD-10-CM | POA: Diagnosis not present

## 2020-11-09 DIAGNOSIS — M6281 Muscle weakness (generalized): Secondary | ICD-10-CM | POA: Diagnosis not present

## 2020-11-09 DIAGNOSIS — U071 COVID-19: Secondary | ICD-10-CM | POA: Diagnosis not present

## 2020-11-09 DIAGNOSIS — F0390 Unspecified dementia without behavioral disturbance: Secondary | ICD-10-CM | POA: Diagnosis not present

## 2020-11-09 DIAGNOSIS — M1612 Unilateral primary osteoarthritis, left hip: Secondary | ICD-10-CM | POA: Diagnosis not present

## 2020-11-09 DIAGNOSIS — I2510 Atherosclerotic heart disease of native coronary artery without angina pectoris: Secondary | ICD-10-CM | POA: Diagnosis not present

## 2020-11-09 DIAGNOSIS — S2232XD Fracture of one rib, left side, subsequent encounter for fracture with routine healing: Secondary | ICD-10-CM | POA: Diagnosis not present

## 2020-11-09 DIAGNOSIS — E785 Hyperlipidemia, unspecified: Secondary | ICD-10-CM | POA: Diagnosis not present

## 2020-11-11 DIAGNOSIS — M1612 Unilateral primary osteoarthritis, left hip: Secondary | ICD-10-CM | POA: Diagnosis not present

## 2020-11-11 DIAGNOSIS — M6281 Muscle weakness (generalized): Secondary | ICD-10-CM | POA: Diagnosis not present

## 2020-11-11 DIAGNOSIS — F0390 Unspecified dementia without behavioral disturbance: Secondary | ICD-10-CM | POA: Diagnosis not present

## 2020-11-11 DIAGNOSIS — D6489 Other specified anemias: Secondary | ICD-10-CM | POA: Diagnosis not present

## 2020-11-11 DIAGNOSIS — S2232XD Fracture of one rib, left side, subsequent encounter for fracture with routine healing: Secondary | ICD-10-CM | POA: Diagnosis not present

## 2020-11-11 DIAGNOSIS — E785 Hyperlipidemia, unspecified: Secondary | ICD-10-CM | POA: Diagnosis not present

## 2020-11-11 DIAGNOSIS — I2510 Atherosclerotic heart disease of native coronary artery without angina pectoris: Secondary | ICD-10-CM | POA: Diagnosis not present

## 2020-11-11 DIAGNOSIS — S0003XD Contusion of scalp, subsequent encounter: Secondary | ICD-10-CM | POA: Diagnosis not present

## 2020-11-11 DIAGNOSIS — I482 Chronic atrial fibrillation, unspecified: Secondary | ICD-10-CM | POA: Diagnosis not present

## 2020-11-15 DIAGNOSIS — D6489 Other specified anemias: Secondary | ICD-10-CM | POA: Diagnosis not present

## 2020-11-15 DIAGNOSIS — I482 Chronic atrial fibrillation, unspecified: Secondary | ICD-10-CM | POA: Diagnosis not present

## 2020-11-15 DIAGNOSIS — S0003XD Contusion of scalp, subsequent encounter: Secondary | ICD-10-CM | POA: Diagnosis not present

## 2020-11-15 DIAGNOSIS — M6281 Muscle weakness (generalized): Secondary | ICD-10-CM | POA: Diagnosis not present

## 2020-11-15 DIAGNOSIS — M1612 Unilateral primary osteoarthritis, left hip: Secondary | ICD-10-CM | POA: Diagnosis not present

## 2020-11-15 DIAGNOSIS — F0390 Unspecified dementia without behavioral disturbance: Secondary | ICD-10-CM | POA: Diagnosis not present

## 2020-11-15 DIAGNOSIS — S2232XD Fracture of one rib, left side, subsequent encounter for fracture with routine healing: Secondary | ICD-10-CM | POA: Diagnosis not present

## 2020-11-15 DIAGNOSIS — E785 Hyperlipidemia, unspecified: Secondary | ICD-10-CM | POA: Diagnosis not present

## 2020-11-15 DIAGNOSIS — I2510 Atherosclerotic heart disease of native coronary artery without angina pectoris: Secondary | ICD-10-CM | POA: Diagnosis not present

## 2020-11-16 DIAGNOSIS — Z20828 Contact with and (suspected) exposure to other viral communicable diseases: Secondary | ICD-10-CM | POA: Diagnosis not present

## 2020-11-16 DIAGNOSIS — U071 COVID-19: Secondary | ICD-10-CM | POA: Diagnosis not present

## 2020-11-17 DIAGNOSIS — D6489 Other specified anemias: Secondary | ICD-10-CM | POA: Diagnosis not present

## 2020-11-17 DIAGNOSIS — I482 Chronic atrial fibrillation, unspecified: Secondary | ICD-10-CM | POA: Diagnosis not present

## 2020-11-17 DIAGNOSIS — S0003XD Contusion of scalp, subsequent encounter: Secondary | ICD-10-CM | POA: Diagnosis not present

## 2020-11-17 DIAGNOSIS — M1612 Unilateral primary osteoarthritis, left hip: Secondary | ICD-10-CM | POA: Diagnosis not present

## 2020-11-17 DIAGNOSIS — E785 Hyperlipidemia, unspecified: Secondary | ICD-10-CM | POA: Diagnosis not present

## 2020-11-17 DIAGNOSIS — I2510 Atherosclerotic heart disease of native coronary artery without angina pectoris: Secondary | ICD-10-CM | POA: Diagnosis not present

## 2020-11-17 DIAGNOSIS — F0390 Unspecified dementia without behavioral disturbance: Secondary | ICD-10-CM | POA: Diagnosis not present

## 2020-11-17 DIAGNOSIS — M6281 Muscle weakness (generalized): Secondary | ICD-10-CM | POA: Diagnosis not present

## 2020-11-17 DIAGNOSIS — S2232XD Fracture of one rib, left side, subsequent encounter for fracture with routine healing: Secondary | ICD-10-CM | POA: Diagnosis not present

## 2020-11-18 DIAGNOSIS — E785 Hyperlipidemia, unspecified: Secondary | ICD-10-CM | POA: Diagnosis not present

## 2020-11-18 DIAGNOSIS — D6489 Other specified anemias: Secondary | ICD-10-CM | POA: Diagnosis not present

## 2020-11-18 DIAGNOSIS — I2510 Atherosclerotic heart disease of native coronary artery without angina pectoris: Secondary | ICD-10-CM | POA: Diagnosis not present

## 2020-11-18 DIAGNOSIS — M6281 Muscle weakness (generalized): Secondary | ICD-10-CM | POA: Diagnosis not present

## 2020-11-18 DIAGNOSIS — I482 Chronic atrial fibrillation, unspecified: Secondary | ICD-10-CM | POA: Diagnosis not present

## 2020-11-18 DIAGNOSIS — S0003XD Contusion of scalp, subsequent encounter: Secondary | ICD-10-CM | POA: Diagnosis not present

## 2020-11-18 DIAGNOSIS — F0390 Unspecified dementia without behavioral disturbance: Secondary | ICD-10-CM | POA: Diagnosis not present

## 2020-11-18 DIAGNOSIS — M1612 Unilateral primary osteoarthritis, left hip: Secondary | ICD-10-CM | POA: Diagnosis not present

## 2020-11-18 DIAGNOSIS — S2232XD Fracture of one rib, left side, subsequent encounter for fracture with routine healing: Secondary | ICD-10-CM | POA: Diagnosis not present

## 2020-11-22 DIAGNOSIS — S2232XD Fracture of one rib, left side, subsequent encounter for fracture with routine healing: Secondary | ICD-10-CM | POA: Diagnosis not present

## 2020-11-22 DIAGNOSIS — F0390 Unspecified dementia without behavioral disturbance: Secondary | ICD-10-CM | POA: Diagnosis not present

## 2020-11-22 DIAGNOSIS — M6281 Muscle weakness (generalized): Secondary | ICD-10-CM | POA: Diagnosis not present

## 2020-11-22 DIAGNOSIS — D6489 Other specified anemias: Secondary | ICD-10-CM | POA: Diagnosis not present

## 2020-11-22 DIAGNOSIS — I482 Chronic atrial fibrillation, unspecified: Secondary | ICD-10-CM | POA: Diagnosis not present

## 2020-11-22 DIAGNOSIS — I2510 Atherosclerotic heart disease of native coronary artery without angina pectoris: Secondary | ICD-10-CM | POA: Diagnosis not present

## 2020-11-22 DIAGNOSIS — S0003XD Contusion of scalp, subsequent encounter: Secondary | ICD-10-CM | POA: Diagnosis not present

## 2020-11-22 DIAGNOSIS — M1612 Unilateral primary osteoarthritis, left hip: Secondary | ICD-10-CM | POA: Diagnosis not present

## 2020-11-22 DIAGNOSIS — E785 Hyperlipidemia, unspecified: Secondary | ICD-10-CM | POA: Diagnosis not present

## 2020-11-23 DIAGNOSIS — U071 COVID-19: Secondary | ICD-10-CM | POA: Diagnosis not present

## 2020-11-23 DIAGNOSIS — Z20828 Contact with and (suspected) exposure to other viral communicable diseases: Secondary | ICD-10-CM | POA: Diagnosis not present

## 2020-11-24 DIAGNOSIS — S2232XD Fracture of one rib, left side, subsequent encounter for fracture with routine healing: Secondary | ICD-10-CM | POA: Diagnosis not present

## 2020-11-24 DIAGNOSIS — M6281 Muscle weakness (generalized): Secondary | ICD-10-CM | POA: Diagnosis not present

## 2020-11-24 DIAGNOSIS — I2510 Atherosclerotic heart disease of native coronary artery without angina pectoris: Secondary | ICD-10-CM | POA: Diagnosis not present

## 2020-11-24 DIAGNOSIS — S0003XD Contusion of scalp, subsequent encounter: Secondary | ICD-10-CM | POA: Diagnosis not present

## 2020-11-24 DIAGNOSIS — E785 Hyperlipidemia, unspecified: Secondary | ICD-10-CM | POA: Diagnosis not present

## 2020-11-24 DIAGNOSIS — M1612 Unilateral primary osteoarthritis, left hip: Secondary | ICD-10-CM | POA: Diagnosis not present

## 2020-11-24 DIAGNOSIS — D6489 Other specified anemias: Secondary | ICD-10-CM | POA: Diagnosis not present

## 2020-11-24 DIAGNOSIS — I482 Chronic atrial fibrillation, unspecified: Secondary | ICD-10-CM | POA: Diagnosis not present

## 2020-11-24 DIAGNOSIS — F0390 Unspecified dementia without behavioral disturbance: Secondary | ICD-10-CM | POA: Diagnosis not present

## 2020-11-25 DIAGNOSIS — I2510 Atherosclerotic heart disease of native coronary artery without angina pectoris: Secondary | ICD-10-CM | POA: Diagnosis not present

## 2020-11-25 DIAGNOSIS — D6489 Other specified anemias: Secondary | ICD-10-CM | POA: Diagnosis not present

## 2020-11-25 DIAGNOSIS — E785 Hyperlipidemia, unspecified: Secondary | ICD-10-CM | POA: Diagnosis not present

## 2020-11-25 DIAGNOSIS — F0390 Unspecified dementia without behavioral disturbance: Secondary | ICD-10-CM | POA: Diagnosis not present

## 2020-11-25 DIAGNOSIS — M1612 Unilateral primary osteoarthritis, left hip: Secondary | ICD-10-CM | POA: Diagnosis not present

## 2020-11-25 DIAGNOSIS — M6281 Muscle weakness (generalized): Secondary | ICD-10-CM | POA: Diagnosis not present

## 2020-11-25 DIAGNOSIS — I482 Chronic atrial fibrillation, unspecified: Secondary | ICD-10-CM | POA: Diagnosis not present

## 2020-11-25 DIAGNOSIS — S0003XD Contusion of scalp, subsequent encounter: Secondary | ICD-10-CM | POA: Diagnosis not present

## 2020-11-25 DIAGNOSIS — S2232XD Fracture of one rib, left side, subsequent encounter for fracture with routine healing: Secondary | ICD-10-CM | POA: Diagnosis not present

## 2020-11-27 ENCOUNTER — Emergency Department (HOSPITAL_COMMUNITY)
Admission: EM | Admit: 2020-11-27 | Discharge: 2020-11-27 | Disposition: A | Discharge status: Home / Self Care | Payer: Medicare HMO | Attending: Emergency Medicine | Admitting: Emergency Medicine

## 2020-11-27 ENCOUNTER — Emergency Department (HOSPITAL_COMMUNITY): Payer: Medicare HMO

## 2020-11-27 ENCOUNTER — Other Ambulatory Visit: Payer: Self-pay

## 2020-11-27 DIAGNOSIS — R4182 Altered mental status, unspecified: Secondary | ICD-10-CM | POA: Diagnosis not present

## 2020-11-27 DIAGNOSIS — I2510 Atherosclerotic heart disease of native coronary artery without angina pectoris: Secondary | ICD-10-CM | POA: Diagnosis not present

## 2020-11-27 DIAGNOSIS — Z87891 Personal history of nicotine dependence: Secondary | ICD-10-CM | POA: Diagnosis not present

## 2020-11-27 DIAGNOSIS — R531 Weakness: Secondary | ICD-10-CM | POA: Diagnosis not present

## 2020-11-27 DIAGNOSIS — Z20822 Contact with and (suspected) exposure to covid-19: Secondary | ICD-10-CM | POA: Insufficient documentation

## 2020-11-27 DIAGNOSIS — Z8551 Personal history of malignant neoplasm of bladder: Secondary | ICD-10-CM | POA: Insufficient documentation

## 2020-11-27 DIAGNOSIS — R0902 Hypoxemia: Secondary | ICD-10-CM | POA: Diagnosis not present

## 2020-11-27 DIAGNOSIS — F0390 Unspecified dementia without behavioral disturbance: Secondary | ICD-10-CM | POA: Insufficient documentation

## 2020-11-27 DIAGNOSIS — R000 Tachycardia, unspecified: Secondary | ICD-10-CM | POA: Diagnosis not present

## 2020-11-27 LAB — COMPREHENSIVE METABOLIC PANEL
ALT: 9 U/L (ref 0–44)
AST: 17 U/L (ref 15–41)
Albumin: 4 g/dL (ref 3.5–5.0)
Alkaline Phosphatase: 77 U/L (ref 38–126)
Anion gap: 6 (ref 5–15)
BUN: 25 mg/dL — ABNORMAL HIGH (ref 8–23)
CO2: 29 mmol/L (ref 22–32)
Calcium: 9.4 mg/dL (ref 8.9–10.3)
Chloride: 102 mmol/L (ref 98–111)
Creatinine, Ser: 1.3 mg/dL — ABNORMAL HIGH (ref 0.61–1.24)
GFR, Estimated: 53 mL/min — ABNORMAL LOW (ref >60)
Glucose, Bld: 128 mg/dL — ABNORMAL HIGH (ref 70–99)
Potassium: 3.6 mmol/L (ref 3.5–5.1)
Sodium: 137 mmol/L (ref 135–145)
Total Bilirubin: 0.9 mg/dL (ref 0.3–1.2)
Total Protein: 7 g/dL (ref 6.5–8.1)

## 2020-11-27 LAB — URINALYSIS, ROUTINE W REFLEX MICROSCOPIC
Bilirubin Urine: NEGATIVE
Glucose, UA: NEGATIVE mg/dL
Hgb urine dipstick: NEGATIVE
Ketones, ur: NEGATIVE mg/dL
Leukocytes,Ua: NEGATIVE
Nitrite: NEGATIVE
Protein, ur: NEGATIVE mg/dL
Specific Gravity, Urine: 1.008 (ref 1.005–1.030)
pH: 6 (ref 5.0–8.0)

## 2020-11-27 LAB — CBC WITH DIFFERENTIAL/PLATELET
Abs Immature Granulocytes: 0.01 10*3/uL (ref 0.00–0.07)
Basophils Absolute: 0 10*3/uL (ref 0.0–0.1)
Basophils Relative: 1 %
Eosinophils Absolute: 0.1 10*3/uL (ref 0.0–0.5)
Eosinophils Relative: 1 %
HCT: 39.9 % (ref 39.0–52.0)
Hemoglobin: 13.3 g/dL (ref 13.0–17.0)
Immature Granulocytes: 0 %
Lymphocytes Relative: 16 %
Lymphs Abs: 1 10*3/uL (ref 0.7–4.0)
MCH: 32.9 pg (ref 26.0–34.0)
MCHC: 33.3 g/dL (ref 30.0–36.0)
MCV: 98.8 fL (ref 80.0–100.0)
Monocytes Absolute: 0.6 10*3/uL (ref 0.1–1.0)
Monocytes Relative: 9 %
Neutro Abs: 4.9 10*3/uL (ref 1.7–7.7)
Neutrophils Relative %: 73 %
Platelets: 152 10*3/uL (ref 150–400)
RBC: 4.04 MIL/uL — ABNORMAL LOW (ref 4.22–5.81)
RDW: 13 % (ref 11.5–15.5)
WBC: 6.6 10*3/uL (ref 4.0–10.5)
nRBC: 0 % (ref 0.0–0.2)

## 2020-11-27 LAB — RESP PANEL BY RT-PCR (FLU A&B, COVID) ARPGX2
Influenza A by PCR: NEGATIVE
Influenza B by PCR: NEGATIVE
SARS Coronavirus 2 by RT PCR: NEGATIVE

## 2020-11-27 LAB — TROPONIN I (HIGH SENSITIVITY)
Troponin I (High Sensitivity): 5 ng/L (ref <18)
Troponin I (High Sensitivity): 7 ng/L (ref <18)

## 2020-11-27 LAB — LIPASE, BLOOD: Lipase: 27 U/L (ref 11–51)

## 2020-11-27 LAB — TSH: TSH: 3.634 u[IU]/mL (ref 0.350–4.500)

## 2020-11-27 MED ORDER — SODIUM CHLORIDE 0.9 % IV BOLUS
500.0000 mL | Freq: Once | INTRAVENOUS | Status: AC
  Administered 2020-11-27: 500 mL via INTRAVENOUS

## 2020-11-27 NOTE — ED Notes (Signed)
Patient assessed by Dr Laverta Baltimore upon arrival.

## 2020-11-27 NOTE — ED Provider Notes (Signed)
Emergency Medicine Provider Triage Evaluation Note  [name] , a 87 y.o. male  was evaluated in triage.  Pt complains of acute onset generalized weakness.  Review of Systems  Positive: Weakness and severe fatigue Negative: CP, SOB, numbness, HA  Physical Exam  BP 115/86   Resp 19  Gen:   Awake, no distress but very fatigued.  Resp:  Normal effort  MSK:   Moves extremities equally with difficulty but no   unilateral weakness.  Other:  Well perfused.   Medical Decision Making  Medically screening exam initiated at 2:54 PM.  Appropriate orders placed.  [name] was informed that the remainder of the evaluation will be completed by another provider, this initial triage assessment does not replace that evaluation, and the importance of remaining in the ED until their evaluation is complete.  Patient presents with acute onset generalized weakness.  He is very weak overall but I do not appreciate any focal neurologic deficits to activate a code stroke.  Last normal 30 minutes prior to arrival.    Adrian Fast, MD 11/27/20 1455

## 2020-11-27 NOTE — Discharge Instructions (Addendum)
Your lab tests, EKG CT imaging along with your exam is reassuring tonight.  I recommend following up with your primary doctor for recheck of your symptoms, particularly if they return or become more frequent.  Make sure you are drinking plenty of fluids to avoid dehydration.

## 2020-11-27 NOTE — ED Provider Notes (Addendum)
[facility] EMERGENCY DEPARTMENT Provider Note   CSN: [id number] Arrival date & time: 11/27/20  1437     History Chief Complaint  Patient presents with  . Weakness    [name] is a 87 y.o. male with a history of CAD, CVA, dementia, atrial fibrillation on Eliquis presenting for evaluation from his nursing home for reported sudden onset of generalized weakness "all over his body".  He states he was walking in the hallway at the nursing home when he suddenly became weak and sat down to avoid falling.  He denies any complaints of pain, headache, chest pain, shortness of breath, also no nausea or vomiting.  He states he feels well currently and has no complaints at this time.   The history is provided by the patient and the nursing home.       Past Medical History:  Diagnosis Date  . [name] deficiency 2011  . Bladder cancer ([place])    Malignet Neoplasm  . CAD (coronary artery disease)   . Cerebral infarction ([place])   . Dementia ([place])   . Hx of colonic polyp   . Hyperlipidemia   . Memory loss 2011  . Unspecified atrial fibrillation [facility])     Patient Active Problem List   Diagnosis Date Noted  . Syncope 11/02/2020  . Macrocytic anemia 11/02/2020  . Left rib fracture 11/02/2020  . Scalp hematoma 11/02/2020  . Fall 11/01/2020  . Hygroma 09/10/2018  . 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 Chest pain 12/29/2013  . Atrial fibrillation ([place]) 12/29/2013  . VERTIGO 02/04/2010  . [name] DEFICIENCY 01/31/2010  . Memory loss 01/27/2010  . PARESTHESIA 01/27/2010  . Hyperlipidemia 11/02/2009  . CORONARY ARTERY DISEASE 11/02/2009  . SINUSITIS, ACUTE 11/02/2009  . EPISTAXIS 11/02/2009  . COLONIC POLYPS, HX OF 11/02/2009    Past Surgical History:  Procedure Laterality Date  . CHOLECYSTECTOMY         Family History  Problem Relation Age of Onset  . Coronary artery disease Other   . Dementia Other     Social History   Tobacco Use  .  Smoking status: Former Smoker    Quit date: 12/29/1973    Years since quitting: 46.9  . Smokeless tobacco: Never Used  Vaping Use  . Vaping Use: Never used  Substance Use Topics  . Alcohol use: No  . Drug use: Never    Home Medications Prior to Admission medications   Medication Sig Start Date End Date Taking? Authorizing Provider  acetaminophen (TYLENOL) 325 MG tablet Take 2 tablets (650 mg total) by mouth every 6 (six) hours. 11/03/20   Johnson, Clanford L, MD  atorvastatin (LIPITOR) 20 MG tablet Take 1 tablet (20 mg total) by mouth daily at 6 PM. 09/14/18   Rinehuls, Early Chars, PA-C  furosemide (LASIX) 20 MG tablet Take 20 mg by mouth every other day. 10/14/20   [provider]  loratadine (CLARITIN) 10 MG tablet Take 10 mg by mouth daily.    [provider]  midodrine (PROAMATINE) 5 MG tablet Take 5 mg by mouth 2 (two) times daily with a meal.    [provider]  Polyethylene Glycol 3350 (MIRALAX PO) Take by mouth. Take as needed for constipation    [provider]  potassium chloride (KLOR-CON) 10 MEQ tablet Take 10 mEq by mouth every other day. 10/14/20   [provider]  Rivaroxaban (XARELTO) 15 MG TABS tablet Take 15 mg by mouth daily  with supper.    [provider]  vitamin B-12 (CYANOCOBALAMIN) 1000 MCG tablet Take 1 tablet (1,000 mcg total) by mouth daily. 11/04/20   Murlean Iba, MD    Allergies    Patient has no known allergies.  Review of Systems   Review of Systems  Unable to perform ROS: Dementia  Neurological: Positive for weakness.    Physical Exam Updated Vital Signs BP 108/69 (BP Location: Right Arm)   Pulse 89   Temp 97.7 F (36.5 C) (Oral)   Resp 18   SpO2 100%   Physical Exam Vitals and nursing note reviewed.  Constitutional:      Appearance: Normal appearance. He is well-developed.  HENT:     Head: Normocephalic and atraumatic.  Eyes:     Conjunctiva/sclera: Conjunctivae normal.   Cardiovascular:     Rate and Rhythm: Normal rate and regular rhythm.     Heart sounds: Normal heart sounds.  Pulmonary:     Effort: Pulmonary effort is normal.     Breath sounds: Normal breath sounds. No wheezing.  Abdominal:     General: Bowel sounds are normal.     Palpations: Abdomen is soft.     Tenderness: There is no abdominal tenderness.  Musculoskeletal:        General: Normal range of motion.     Cervical back: Normal range of motion.  Skin:    General: Skin is warm and dry.  Neurological:     General: No focal deficit present.     Mental Status: He is alert.     Cranial Nerves: No cranial nerve deficit.     Sensory: No sensory deficit.     Motor: Motor function is intact.     Gait: Gait normal.     Comments: Ambulatory, slow gait.  Stable. Pleasantly confused, suspected baseline with dementia.     ED Results / Procedures / Treatments   Labs (all labs ordered are listed, but only abnormal results are displayed) Labs Reviewed  COMPREHENSIVE METABOLIC PANEL - Abnormal; Notable for the following components:      Result Value   Glucose, Bld 128 (*)    BUN 25 (*)    Creatinine, Ser 1.30 (*)    GFR, Estimated 53 (*)    All other components within normal limits  CBC WITH DIFFERENTIAL/PLATELET - Abnormal; Notable for the following components:   RBC 4.04 (*)    All other components within normal limits  RESP PANEL BY RT-PCR (FLU A&B, COVID) ARPGX2  URINE CULTURE  LIPASE, BLOOD  URINALYSIS, ROUTINE W REFLEX MICROSCOPIC  TSH  TROPONIN I (HIGH SENSITIVITY)  TROPONIN I (HIGH SENSITIVITY)    EKG EKG Interpretation  Date/Time:  Saturday Nov 27 2020 14:47:03 EDT Ventricular Rate:  72 PR Interval:    QRS Duration: 92 QT Interval:  376 QTC Calculation: 412 R Axis:   95 Text Interpretation: Atrial fibrillation Anterior infarct, old Confirmed by Milton Ferguson [phone]) on 11/27/2020 6:45:46 PM   Radiology CT Head Wo Contrast  Result Date: 11/27/2020 CLINICAL  DATA:  Mental status changes EXAM: CT HEAD WITHOUT CONTRAST TECHNIQUE: Contiguous axial images were obtained from the base of the skull through the vertex without intravenous contrast. COMPARISON:  11/01/2020 FINDINGS: Brain: There is atrophy and chronic small vessel disease changes. No acute intracranial abnormality. Specifically, no hemorrhage, hydrocephalus, mass lesion, acute infarction, or significant intracranial injury. Vascular: No hyperdense vessel or unexpected calcification. Skull: No acute calvarial abnormality. Sinuses/Orbits: Visualized paranasal sinuses and mastoids clear.  Orbital soft tissues unremarkable. Other: None IMPRESSION: Atrophy, chronic microvascular disease. No acute intracranial abnormality. Electronically Signed   By: Rolm Baptise M.D.   On: 11/27/2020 15:26   DG Chest Portable 1 View  Result Date: 11/27/2020 CLINICAL DATA:  Weakness EXAM: PORTABLE CHEST 1 VIEW COMPARISON:  11/01/2020 FINDINGS: Stable heart size. Atherosclerotic calcification of the aortic knob. Chronically coarsened interstitial markings. No focal airspace consolidation, pleural effusion, or pneumothorax. IMPRESSION: No active disease. Electronically Signed   By: Davina Poke D.O.   On: 11/27/2020 15:10    Procedures Procedures   Medications Ordered in ED Medications  sodium chloride 0.9 % bolus 500 mL (0 mLs Intravenous Stopped 11/27/20 1610)    ED Course  I have reviewed the triage vital signs and the nursing notes.  Pertinent labs & imaging results that were available during my care of the patient were reviewed by me and considered in my medical decision making (see chart for details).    MDM Rules/Calculators/A&P                          Patient with a transient episode of weakness which is now resolved.  He has no electrolyte abnormalities, he is not dehydrated, CT imaging is negative for acute hemorrhagic stroke, his exam is reassuring with no focal weakness.  Patient reports he feels  much better and is ready to return to his nursing center.  He denies any symptoms at this time.  He was ambulated in the department and had no difficulties with this activity.  Plan follow-up with his PCP For any return or increased frequency symptoms.   Final Clinical Impression(s) / ED Diagnoses Final diagnoses:  Weakness    Rx / DC Orders ED Discharge Orders    None       Landis Martins 11/27/20 2021    Evalee Jefferson, PA-C 11/27/20 2024    Milton Ferguson, MD 11/27/20 2316

## 2020-11-27 NOTE — ED Notes (Signed)
Back  From xray via stretcher.

## 2020-11-27 NOTE — ED Triage Notes (Signed)
Pt from [facility] via [facility]. Pt reports sudden onset of weakness "all over his body." Pt denies pain.

## 2020-11-27 NOTE — ED Notes (Signed)
Patient ambulated around nurses station, gait steady. EDPA made aware.

## 2020-11-29 DIAGNOSIS — R32 Unspecified urinary incontinence: Secondary | ICD-10-CM | POA: Diagnosis not present

## 2020-11-29 DIAGNOSIS — F0391 Unspecified dementia with behavioral disturbance: Secondary | ICD-10-CM | POA: Diagnosis not present

## 2020-11-29 DIAGNOSIS — E785 Hyperlipidemia, unspecified: Secondary | ICD-10-CM | POA: Diagnosis not present

## 2020-11-29 DIAGNOSIS — F0390 Unspecified dementia without behavioral disturbance: Secondary | ICD-10-CM | POA: Diagnosis not present

## 2020-11-29 DIAGNOSIS — M6281 Muscle weakness (generalized): Secondary | ICD-10-CM | POA: Diagnosis not present

## 2020-11-29 DIAGNOSIS — I2510 Atherosclerotic heart disease of native coronary artery without angina pectoris: Secondary | ICD-10-CM | POA: Diagnosis not present

## 2020-11-29 DIAGNOSIS — S0003XD Contusion of scalp, subsequent encounter: Secondary | ICD-10-CM | POA: Diagnosis not present

## 2020-11-29 DIAGNOSIS — S2232XD Fracture of one rib, left side, subsequent encounter for fracture with routine healing: Secondary | ICD-10-CM | POA: Diagnosis not present

## 2020-11-29 DIAGNOSIS — I482 Chronic atrial fibrillation, unspecified: Secondary | ICD-10-CM | POA: Diagnosis not present

## 2020-11-29 DIAGNOSIS — D6489 Other specified anemias: Secondary | ICD-10-CM | POA: Diagnosis not present

## 2020-11-29 DIAGNOSIS — M1612 Unilateral primary osteoarthritis, left hip: Secondary | ICD-10-CM | POA: Diagnosis not present

## 2020-11-29 LAB — URINE CULTURE: Culture: NO GROWTH

## 2020-11-30 DIAGNOSIS — Z20828 Contact with and (suspected) exposure to other viral communicable diseases: Secondary | ICD-10-CM | POA: Diagnosis not present

## 2020-11-30 DIAGNOSIS — U071 COVID-19: Secondary | ICD-10-CM | POA: Diagnosis not present

## 2020-12-02 DIAGNOSIS — S2232XD Fracture of one rib, left side, subsequent encounter for fracture with routine healing: Secondary | ICD-10-CM | POA: Diagnosis not present

## 2020-12-02 DIAGNOSIS — I2510 Atherosclerotic heart disease of native coronary artery without angina pectoris: Secondary | ICD-10-CM | POA: Diagnosis not present

## 2020-12-02 DIAGNOSIS — S0003XD Contusion of scalp, subsequent encounter: Secondary | ICD-10-CM | POA: Diagnosis not present

## 2020-12-02 DIAGNOSIS — D6489 Other specified anemias: Secondary | ICD-10-CM | POA: Diagnosis not present

## 2020-12-02 DIAGNOSIS — I482 Chronic atrial fibrillation, unspecified: Secondary | ICD-10-CM | POA: Diagnosis not present

## 2020-12-02 DIAGNOSIS — M6281 Muscle weakness (generalized): Secondary | ICD-10-CM | POA: Diagnosis not present

## 2020-12-02 DIAGNOSIS — F0390 Unspecified dementia without behavioral disturbance: Secondary | ICD-10-CM | POA: Diagnosis not present

## 2020-12-02 DIAGNOSIS — M1612 Unilateral primary osteoarthritis, left hip: Secondary | ICD-10-CM | POA: Diagnosis not present

## 2020-12-02 DIAGNOSIS — E785 Hyperlipidemia, unspecified: Secondary | ICD-10-CM | POA: Diagnosis not present

## 2020-12-03 DIAGNOSIS — F0390 Unspecified dementia without behavioral disturbance: Secondary | ICD-10-CM | POA: Diagnosis not present

## 2020-12-03 DIAGNOSIS — S0003XD Contusion of scalp, subsequent encounter: Secondary | ICD-10-CM | POA: Diagnosis not present

## 2020-12-03 DIAGNOSIS — M6281 Muscle weakness (generalized): Secondary | ICD-10-CM | POA: Diagnosis not present

## 2020-12-03 DIAGNOSIS — I482 Chronic atrial fibrillation, unspecified: Secondary | ICD-10-CM | POA: Diagnosis not present

## 2020-12-03 DIAGNOSIS — E785 Hyperlipidemia, unspecified: Secondary | ICD-10-CM | POA: Diagnosis not present

## 2020-12-03 DIAGNOSIS — I2510 Atherosclerotic heart disease of native coronary artery without angina pectoris: Secondary | ICD-10-CM | POA: Diagnosis not present

## 2020-12-03 DIAGNOSIS — D6489 Other specified anemias: Secondary | ICD-10-CM | POA: Diagnosis not present

## 2020-12-03 DIAGNOSIS — S2232XD Fracture of one rib, left side, subsequent encounter for fracture with routine healing: Secondary | ICD-10-CM | POA: Diagnosis not present

## 2020-12-03 DIAGNOSIS — M1612 Unilateral primary osteoarthritis, left hip: Secondary | ICD-10-CM | POA: Diagnosis not present

## 2020-12-07 DIAGNOSIS — I480 Paroxysmal atrial fibrillation: Secondary | ICD-10-CM | POA: Diagnosis not present

## 2020-12-07 DIAGNOSIS — I482 Chronic atrial fibrillation, unspecified: Secondary | ICD-10-CM | POA: Diagnosis not present

## 2020-12-07 DIAGNOSIS — S0003XD Contusion of scalp, subsequent encounter: Secondary | ICD-10-CM | POA: Diagnosis not present

## 2020-12-07 DIAGNOSIS — M1612 Unilateral primary osteoarthritis, left hip: Secondary | ICD-10-CM | POA: Diagnosis not present

## 2020-12-07 DIAGNOSIS — N182 Chronic kidney disease, stage 2 (mild): Secondary | ICD-10-CM | POA: Diagnosis not present

## 2020-12-07 DIAGNOSIS — S2232XD Fracture of one rib, left side, subsequent encounter for fracture with routine healing: Secondary | ICD-10-CM | POA: Diagnosis not present

## 2020-12-07 DIAGNOSIS — I2510 Atherosclerotic heart disease of native coronary artery without angina pectoris: Secondary | ICD-10-CM | POA: Diagnosis not present

## 2020-12-07 DIAGNOSIS — D6489 Other specified anemias: Secondary | ICD-10-CM | POA: Diagnosis not present

## 2020-12-07 DIAGNOSIS — F0390 Unspecified dementia without behavioral disturbance: Secondary | ICD-10-CM | POA: Diagnosis not present

## 2020-12-07 DIAGNOSIS — E785 Hyperlipidemia, unspecified: Secondary | ICD-10-CM | POA: Diagnosis not present

## 2020-12-07 DIAGNOSIS — M6281 Muscle weakness (generalized): Secondary | ICD-10-CM | POA: Diagnosis not present

## 2020-12-08 DIAGNOSIS — Z20828 Contact with and (suspected) exposure to other viral communicable diseases: Secondary | ICD-10-CM | POA: Diagnosis not present

## 2020-12-08 DIAGNOSIS — U071 COVID-19: Secondary | ICD-10-CM | POA: Diagnosis not present

## 2020-12-09 DIAGNOSIS — F0390 Unspecified dementia without behavioral disturbance: Secondary | ICD-10-CM | POA: Diagnosis not present

## 2020-12-09 DIAGNOSIS — I482 Chronic atrial fibrillation, unspecified: Secondary | ICD-10-CM | POA: Diagnosis not present

## 2020-12-09 DIAGNOSIS — S2232XD Fracture of one rib, left side, subsequent encounter for fracture with routine healing: Secondary | ICD-10-CM | POA: Diagnosis not present

## 2020-12-09 DIAGNOSIS — E785 Hyperlipidemia, unspecified: Secondary | ICD-10-CM | POA: Diagnosis not present

## 2020-12-09 DIAGNOSIS — M1612 Unilateral primary osteoarthritis, left hip: Secondary | ICD-10-CM | POA: Diagnosis not present

## 2020-12-09 DIAGNOSIS — M6281 Muscle weakness (generalized): Secondary | ICD-10-CM | POA: Diagnosis not present

## 2020-12-09 DIAGNOSIS — D6489 Other specified anemias: Secondary | ICD-10-CM | POA: Diagnosis not present

## 2020-12-09 DIAGNOSIS — S0003XD Contusion of scalp, subsequent encounter: Secondary | ICD-10-CM | POA: Diagnosis not present

## 2020-12-09 DIAGNOSIS — I2510 Atherosclerotic heart disease of native coronary artery without angina pectoris: Secondary | ICD-10-CM | POA: Diagnosis not present

## 2020-12-10 DIAGNOSIS — F0390 Unspecified dementia without behavioral disturbance: Secondary | ICD-10-CM | POA: Diagnosis not present

## 2020-12-10 DIAGNOSIS — E785 Hyperlipidemia, unspecified: Secondary | ICD-10-CM | POA: Diagnosis not present

## 2020-12-10 DIAGNOSIS — M6281 Muscle weakness (generalized): Secondary | ICD-10-CM | POA: Diagnosis not present

## 2020-12-10 DIAGNOSIS — S0003XD Contusion of scalp, subsequent encounter: Secondary | ICD-10-CM | POA: Diagnosis not present

## 2020-12-10 DIAGNOSIS — D6489 Other specified anemias: Secondary | ICD-10-CM | POA: Diagnosis not present

## 2020-12-10 DIAGNOSIS — S2232XD Fracture of one rib, left side, subsequent encounter for fracture with routine healing: Secondary | ICD-10-CM | POA: Diagnosis not present

## 2020-12-10 DIAGNOSIS — I482 Chronic atrial fibrillation, unspecified: Secondary | ICD-10-CM | POA: Diagnosis not present

## 2020-12-10 DIAGNOSIS — M1612 Unilateral primary osteoarthritis, left hip: Secondary | ICD-10-CM | POA: Diagnosis not present

## 2020-12-10 DIAGNOSIS — I2510 Atherosclerotic heart disease of native coronary artery without angina pectoris: Secondary | ICD-10-CM | POA: Diagnosis not present

## 2020-12-14 DIAGNOSIS — U071 COVID-19: Secondary | ICD-10-CM | POA: Diagnosis not present

## 2020-12-14 DIAGNOSIS — Z20828 Contact with and (suspected) exposure to other viral communicable diseases: Secondary | ICD-10-CM | POA: Diagnosis not present

## 2020-12-16 DIAGNOSIS — R278 Other lack of coordination: Secondary | ICD-10-CM | POA: Diagnosis not present

## 2020-12-21 DIAGNOSIS — U071 COVID-19: Secondary | ICD-10-CM | POA: Diagnosis not present

## 2020-12-21 DIAGNOSIS — Z20828 Contact with and (suspected) exposure to other viral communicable diseases: Secondary | ICD-10-CM | POA: Diagnosis not present

## 2020-12-23 DIAGNOSIS — R278 Other lack of coordination: Secondary | ICD-10-CM | POA: Diagnosis not present

## 2020-12-24 DIAGNOSIS — R278 Other lack of coordination: Secondary | ICD-10-CM | POA: Diagnosis not present

## 2020-12-28 DIAGNOSIS — R278 Other lack of coordination: Secondary | ICD-10-CM | POA: Diagnosis not present

## 2020-12-28 DIAGNOSIS — U071 COVID-19: Secondary | ICD-10-CM | POA: Diagnosis not present

## 2020-12-28 DIAGNOSIS — Z20828 Contact with and (suspected) exposure to other viral communicable diseases: Secondary | ICD-10-CM | POA: Diagnosis not present

## 2020-12-30 DIAGNOSIS — R32 Unspecified urinary incontinence: Secondary | ICD-10-CM | POA: Diagnosis not present

## 2020-12-30 DIAGNOSIS — R278 Other lack of coordination: Secondary | ICD-10-CM | POA: Diagnosis not present

## 2020-12-30 DIAGNOSIS — F0391 Unspecified dementia with behavioral disturbance: Secondary | ICD-10-CM | POA: Diagnosis not present

## 2021-01-04 DIAGNOSIS — Z20828 Contact with and (suspected) exposure to other viral communicable diseases: Secondary | ICD-10-CM | POA: Diagnosis not present

## 2021-01-04 DIAGNOSIS — R278 Other lack of coordination: Secondary | ICD-10-CM | POA: Diagnosis not present

## 2021-01-04 DIAGNOSIS — U071 COVID-19: Secondary | ICD-10-CM | POA: Diagnosis not present

## 2021-01-06 DIAGNOSIS — R278 Other lack of coordination: Secondary | ICD-10-CM | POA: Diagnosis not present

## 2021-01-07 DIAGNOSIS — I480 Paroxysmal atrial fibrillation: Secondary | ICD-10-CM | POA: Diagnosis not present

## 2021-01-07 DIAGNOSIS — N182 Chronic kidney disease, stage 2 (mild): Secondary | ICD-10-CM | POA: Diagnosis not present

## 2021-01-07 DIAGNOSIS — E785 Hyperlipidemia, unspecified: Secondary | ICD-10-CM | POA: Diagnosis not present

## 2021-01-11 DIAGNOSIS — U071 COVID-19: Secondary | ICD-10-CM | POA: Diagnosis not present

## 2021-01-11 DIAGNOSIS — R278 Other lack of coordination: Secondary | ICD-10-CM | POA: Diagnosis not present

## 2021-01-11 DIAGNOSIS — Z20828 Contact with and (suspected) exposure to other viral communicable diseases: Secondary | ICD-10-CM | POA: Diagnosis not present

## 2021-01-13 DIAGNOSIS — R278 Other lack of coordination: Secondary | ICD-10-CM | POA: Diagnosis not present

## 2021-01-18 DIAGNOSIS — U071 COVID-19: Secondary | ICD-10-CM | POA: Diagnosis not present

## 2021-01-18 DIAGNOSIS — R278 Other lack of coordination: Secondary | ICD-10-CM | POA: Diagnosis not present

## 2021-01-18 DIAGNOSIS — Z20828 Contact with and (suspected) exposure to other viral communicable diseases: Secondary | ICD-10-CM | POA: Diagnosis not present

## 2021-01-25 DIAGNOSIS — R278 Other lack of coordination: Secondary | ICD-10-CM | POA: Diagnosis not present

## 2021-01-26 DIAGNOSIS — H524 Presbyopia: Secondary | ICD-10-CM | POA: Diagnosis not present

## 2021-01-26 DIAGNOSIS — R278 Other lack of coordination: Secondary | ICD-10-CM | POA: Diagnosis not present

## 2021-01-30 DIAGNOSIS — R278 Other lack of coordination: Secondary | ICD-10-CM | POA: Diagnosis not present

## 2021-01-31 DIAGNOSIS — N182 Chronic kidney disease, stage 2 (mild): Secondary | ICD-10-CM | POA: Diagnosis not present

## 2021-01-31 DIAGNOSIS — N390 Urinary tract infection, site not specified: Secondary | ICD-10-CM | POA: Diagnosis not present

## 2021-02-01 DIAGNOSIS — R278 Other lack of coordination: Secondary | ICD-10-CM | POA: Diagnosis not present

## 2021-02-02 DIAGNOSIS — F0391 Unspecified dementia with behavioral disturbance: Secondary | ICD-10-CM | POA: Diagnosis not present

## 2021-02-02 DIAGNOSIS — R32 Unspecified urinary incontinence: Secondary | ICD-10-CM | POA: Diagnosis not present

## 2021-02-07 DIAGNOSIS — N182 Chronic kidney disease, stage 2 (mild): Secondary | ICD-10-CM | POA: Diagnosis not present

## 2021-02-07 DIAGNOSIS — E785 Hyperlipidemia, unspecified: Secondary | ICD-10-CM | POA: Diagnosis not present

## 2021-02-08 DIAGNOSIS — R278 Other lack of coordination: Secondary | ICD-10-CM | POA: Diagnosis not present

## 2021-02-10 DIAGNOSIS — R278 Other lack of coordination: Secondary | ICD-10-CM | POA: Diagnosis not present

## 2021-02-16 DIAGNOSIS — R278 Other lack of coordination: Secondary | ICD-10-CM | POA: Diagnosis not present

## 2021-02-17 DIAGNOSIS — R278 Other lack of coordination: Secondary | ICD-10-CM | POA: Diagnosis not present

## 2021-02-22 DIAGNOSIS — R278 Other lack of coordination: Secondary | ICD-10-CM | POA: Diagnosis not present

## 2021-02-24 DIAGNOSIS — R278 Other lack of coordination: Secondary | ICD-10-CM | POA: Diagnosis not present

## 2021-03-02 DIAGNOSIS — R32 Unspecified urinary incontinence: Secondary | ICD-10-CM | POA: Diagnosis not present

## 2021-03-02 DIAGNOSIS — F0391 Unspecified dementia with behavioral disturbance: Secondary | ICD-10-CM | POA: Diagnosis not present

## 2021-03-24 DIAGNOSIS — I480 Paroxysmal atrial fibrillation: Secondary | ICD-10-CM | POA: Diagnosis not present

## 2021-03-24 DIAGNOSIS — G301 Alzheimer's disease with late onset: Secondary | ICD-10-CM | POA: Diagnosis not present

## 2021-03-24 DIAGNOSIS — R2689 Other abnormalities of gait and mobility: Secondary | ICD-10-CM | POA: Diagnosis not present

## 2021-03-24 DIAGNOSIS — N182 Chronic kidney disease, stage 2 (mild): Secondary | ICD-10-CM | POA: Diagnosis not present

## 2021-03-28 ENCOUNTER — Other Ambulatory Visit: Payer: Self-pay

## 2021-03-28 ENCOUNTER — Ambulatory Visit (INDEPENDENT_AMBULATORY_CARE_PROVIDER_SITE_OTHER): Payer: Medicare HMO | Admitting: Cardiology

## 2021-03-28 ENCOUNTER — Encounter: Payer: Self-pay | Admitting: Cardiology

## 2021-03-28 VITALS — BP 126/58 | HR 84 | Ht 68.0 in | Wt 163.0 lb

## 2021-03-28 DIAGNOSIS — I5022 Chronic systolic (congestive) heart failure: Secondary | ICD-10-CM | POA: Diagnosis not present

## 2021-03-28 DIAGNOSIS — I9589 Other hypotension: Secondary | ICD-10-CM | POA: Diagnosis not present

## 2021-03-28 DIAGNOSIS — I4821 Permanent atrial fibrillation: Secondary | ICD-10-CM

## 2021-03-28 NOTE — Progress Notes (Signed)
Clinical Summary Mr. Gaber is a 87 y.o.male seen today for follow up of the following medical problems.   1. Permanent Afib - has not required av nodal agents - on xarelto    - no palpitations - compliant with meds, no bleeding on xarelto     2. Chronic systolic HF - 0000000 echo LVEF 40-45% - has not had ischemic evaluation due to dementia, advanced age and comorbidities - medical therapy limited, and essentially prohibited by his significant low bp's   10/2020 LVEF 40% -denies any SOB/DOE - no recent edema      3. Chronic hypotension - has been on midodrine - occasional lightheadedness or dizziness, infrequent.    - no recent lightheadeness, dizziness   4. Dementia   5.  Prior CVA - CVA in 09/2018 Past Medical History:  Diagnosis Date   [name] deficiency 2011   Bladder cancer ([facility])    Malignet Neoplasm   CAD (coronary artery disease)    Cerebral infarction ([place])    Dementia ([place])    Hx of colonic polyp    Hyperlipidemia    Memory loss 2011   Unspecified atrial fibrillation ([facility])      No Known Allergies   Current Outpatient Medications  Medication Sig Dispense Refill   acetaminophen (TYLENOL) 325 MG tablet Take 2 tablets (650 mg total) by mouth every 6 (six) hours.     atorvastatin (LIPITOR) 20 MG tablet Take 1 tablet (20 mg total) by mouth daily at 6 PM.     furosemide (LASIX) 20 MG tablet Take 20 mg by mouth every other day.     loratadine (CLARITIN) 10 MG tablet Take 10 mg by mouth daily.     midodrine (PROAMATINE) 5 MG tablet Take 5 mg by mouth 2 (two) times daily with a meal.     Polyethylene Glycol 3350 (MIRALAX PO) Take by mouth. Take as needed for constipation     potassium chloride (KLOR-CON) 10 MEQ tablet Take 10 mEq by mouth every other day.     Rivaroxaban (XARELTO) 15 MG TABS tablet Take 15 mg by mouth daily with supper.     vitamin B-12 (CYANOCOBALAMIN) 1000 MCG tablet Take 1 tablet (1,000 mcg total) by mouth daily. 30 tablet    No  current facility-administered medications for this visit.     Past Surgical History:  Procedure Laterality Date   CHOLECYSTECTOMY       No Known Allergies    Family History  Problem Relation Age of Onset   Coronary artery disease Other    Dementia Other      Social History Mr. Eriksen reports that he quit smoking about 47 years ago. He has never used smokeless tobacco. Mr. Zucca reports no history of alcohol use.   Review of Systems CONSTITUTIONAL: No weight loss, fever, chills, weakness or fatigue.  HEENT: Eyes: No visual loss, blurred vision, double vision or yellow sclerae.No hearing loss, sneezing, congestion, runny nose or sore throat.  SKIN: No rash or itching.  CARDIOVASCULAR: per hpi RESPIRATORY: No shortness of breath, cough or sputum.  GASTROINTESTINAL: No anorexia, nausea, vomiting or diarrhea. No abdominal pain or blood.  GENITOURINARY: No burning on urination, no polyuria NEUROLOGICAL: No headache, dizziness, syncope, paralysis, ataxia, numbness or tingling in the extremities. No change in bowel or bladder control.  MUSCULOSKELETAL: No muscle, back pain, joint pain or stiffness.  LYMPHATICS: No enlarged nodes. No history of splenectomy.  PSYCHIATRIC: No history of depression or anxiety.  ENDOCRINOLOGIC: No  reports of sweating, cold or heat intolerance. No polyuria or polydipsia.  Marland Kitchen   Physical Examination Today's Vitals   03/28/21 1259  BP: (!) 126/58  Pulse: 84  SpO2: 96%  Weight: 163 lb (73.9 kg)  Height: '5\' 8"'$  (1.727 m)   Body mass index is 24.78 kg/m.  Gen: resting comfortably, no acute distress HEENT: no scleral icterus, pupils equal round and reactive, no palptable cervical adenopathy,  CV: irreg, no m/r/g no jvd Resp: Clear to auscultation bilaterally GI: abdomen is soft, non-tender, non-distended, normal bowel sounds, no hepatosplenomegaly MSK: extremities are warm, no edema.  Skin: warm, no rash Neuro:  no focal  deficits Psych: appropriate affect   Diagnostic Studies  Echo 09/10/18:     1. The left ventricle has mild-moderately reduced systolic function, with an ejection fraction of 40-45%. The cavity size was normal. There is mildly increased left ventricular wall thickness. Left ventricular diastolic Doppler parameters are  indeterminate.  2. Hypokinesis of the apical, mid to apical anteroseptal, mid-apical inferoseptal, apical anterior, and apical anteroseptal myocardium.  3. The right ventricle has normal systolic function. The cavity was normal. There is no increase in right ventricular wall thickness.  4. Left atrial size was mildly dilated.  5. Right atrial size was severely dilated.  6. The mitral valve is normal in structure.  7. The tricuspid valve is normal in structure.  8. The aortic valve is tricuspid Aortic valve regurgitation is mild by color flow Doppler.  9. The pulmonic valve was normal in structure. Pulmonic valve regurgitation is mild by color flow Doppler. 10. Large patent foramen ovale. 11. Cannot rule out PFO by color flow Doppler.     Assessment and Plan  1. Permanent afib - not requiring av nodal agents.  - no symptoms, continue current meds includnig xarelto   2. Chronic systolic - no symptoms - chronic hypotension has prohibited medical therapy. At 79 with significantly advanced dementia I think very little overall benefit to considering farxiga, I have not started   3. Hypotension - continue midodrine, no recent symptoms.       Arnoldo Lenis, M.D.

## 2021-03-28 NOTE — Patient Instructions (Signed)
Medication Instructions:  Your physician recommends that you continue on your current medications as directed. Please refer to the Current Medication list given to you today.  *If you need a refill on your cardiac medications before your next appointment, please call your pharmacy*   Lab Work: None today If you have labs (blood work) drawn today and your tests are completely normal, you will receive your results only by: MyChart Message (if you have MyChart) OR A paper copy in the mail If you have any lab test that is abnormal or we need to change your treatment, we will call you to review the results.   Testing/Procedures: None today   Follow-Up: At [facility], you and your health needs are our priority.  As part of our continuing mission to provide you with exceptional heart care, we have created designated Provider Care Teams.  These Care Teams include your primary Cardiologist (physician) and Advanced Practice Providers (APPs -  Physician Assistants and Nurse Practitioners) who all work together to provide you with the care you need, when you need it.  We recommend signing up for the patient portal called "MyChart".  Sign up information is provided on this After Visit Summary.  MyChart is used to connect with patients for Virtual Visits (Telemedicine).  Patients are able to view lab/test results, encounter notes, upcoming appointments, etc.  Non-urgent messages can be sent to your provider as well.   To learn more about what you can do with MyChart, go to [email or web address].    Your next appointment:   6 month(s)  The format for your next appointment:   In Person  Provider:   Jonathan Branch, MD   Other Instructions None     

## 2021-04-01 ENCOUNTER — Other Ambulatory Visit: Payer: Self-pay

## 2021-04-01 ENCOUNTER — Emergency Department (HOSPITAL_COMMUNITY)
Admission: EM | Admit: 2021-04-01 | Discharge: 2021-04-02 | Disposition: A | Discharge status: Home / Self Care | Payer: Medicare HMO | Attending: Emergency Medicine | Admitting: Emergency Medicine

## 2021-04-01 ENCOUNTER — Emergency Department (HOSPITAL_COMMUNITY): Payer: Medicare HMO

## 2021-04-01 ENCOUNTER — Encounter (HOSPITAL_COMMUNITY): Payer: Self-pay

## 2021-04-01 DIAGNOSIS — W19XXXA Unspecified fall, initial encounter: Secondary | ICD-10-CM

## 2021-04-01 DIAGNOSIS — F0390 Unspecified dementia without behavioral disturbance: Secondary | ICD-10-CM | POA: Diagnosis not present

## 2021-04-01 DIAGNOSIS — Z87891 Personal history of nicotine dependence: Secondary | ICD-10-CM | POA: Insufficient documentation

## 2021-04-01 DIAGNOSIS — S0990XA Unspecified injury of head, initial encounter: Secondary | ICD-10-CM | POA: Diagnosis not present

## 2021-04-01 DIAGNOSIS — M25572 Pain in left ankle and joints of left foot: Secondary | ICD-10-CM | POA: Diagnosis not present

## 2021-04-01 DIAGNOSIS — Z8551 Personal history of malignant neoplasm of bladder: Secondary | ICD-10-CM | POA: Insufficient documentation

## 2021-04-01 DIAGNOSIS — M25551 Pain in right hip: Secondary | ICD-10-CM | POA: Diagnosis not present

## 2021-04-01 DIAGNOSIS — R404 Transient alteration of awareness: Secondary | ICD-10-CM | POA: Diagnosis not present

## 2021-04-01 DIAGNOSIS — S61512A Laceration without foreign body of left wrist, initial encounter: Secondary | ICD-10-CM | POA: Diagnosis not present

## 2021-04-01 DIAGNOSIS — I2510 Atherosclerotic heart disease of native coronary artery without angina pectoris: Secondary | ICD-10-CM | POA: Insufficient documentation

## 2021-04-01 DIAGNOSIS — Z043 Encounter for examination and observation following other accident: Secondary | ICD-10-CM | POA: Diagnosis not present

## 2021-04-01 DIAGNOSIS — S6992XA Unspecified injury of left wrist, hand and finger(s), initial encounter: Secondary | ICD-10-CM | POA: Diagnosis present

## 2021-04-01 DIAGNOSIS — M25539 Pain in unspecified wrist: Secondary | ICD-10-CM | POA: Diagnosis not present

## 2021-04-01 DIAGNOSIS — W01198A Fall on same level from slipping, tripping and stumbling with subsequent striking against other object, initial encounter: Secondary | ICD-10-CM | POA: Insufficient documentation

## 2021-04-01 DIAGNOSIS — G319 Degenerative disease of nervous system, unspecified: Secondary | ICD-10-CM | POA: Diagnosis not present

## 2021-04-01 DIAGNOSIS — R0902 Hypoxemia: Secondary | ICD-10-CM | POA: Diagnosis not present

## 2021-04-01 NOTE — ED Triage Notes (Signed)
Pt to er room number 12, pt states that he is here for a fall, pt offers no complaints, pt from [facility], pt states that he didn't want to come but the facility insisted.

## 2021-04-01 NOTE — ED Provider Notes (Signed)
[facility] Emergency Department Provider Note MRN:  [id number]  Arrival date & time: 04/01/21     Chief Complaint   Fall   History of Present Illness   [name] is a 87 y.o. year-old male with a history of CAD, dementia presenting to the ED with chief complaint of fall.  Patient tripped and fell, thinks he may have hit the right side of his head.  Denies loss of consciousness, has a skin tear to the left wrist but otherwise denies any pain or symptoms or injury.  No chest pain or shortness of breath, no neck or back pain, no abdominal pain.  Symptoms mild, constant, no exacerbating or alleviating factors.  Review of Systems  A complete 10 system review of systems was obtained and all systems are negative except as noted in the HPI and PMH.   Patient's Health History    Past Medical History:  Diagnosis Date   [name] deficiency 2011   Bladder cancer ([place])    Malignet Neoplasm   CAD (coronary artery disease)    Cerebral infarction ([place])    Dementia ([place])    Hx of colonic polyp    Hyperlipidemia    Memory loss 2011   Unspecified atrial fibrillation ([facility])     Past Surgical History:  Procedure Laterality Date   CHOLECYSTECTOMY      Family History  Problem Relation Age of Onset   Coronary artery disease Other    Dementia Other     Social History   Socioeconomic History   Marital status: Married    Spouse name: Not on file   Number of children: Not on file   Years of education: Not on file   Highest education level: Not on file  Occupational History   Not on file  Tobacco Use   Smoking status: Former    Types: Cigarettes    Quit date: 12/29/1973    Years since quitting: 47.2   Smokeless tobacco: Never  Vaping Use   Vaping Use: Never used  Substance and Sexual Activity   Alcohol use: No   Drug use: Never   Sexual activity: Not Currently  Other Topics Concern   Not on file  Social History Narrative   Regular Exercise- No    Social Determinants of Health   Financial Resource Strain: Not on file  Food Insecurity: Not on file  Transportation Needs: Not on file  Physical Activity: Not on file  Stress: Not on file  Social Connections: Not on file  Intimate Partner Violence: Not on file     Physical Exam   Vitals:   04/01/21 1840 04/01/21 1927  BP: 124/70 (!) 159/89  Pulse: 81 92  Resp: 18 20  Temp: 98.1 F (36.7 C) 98.3 F (36.8 C)  SpO2: 98% 99%    CONSTITUTIONAL: Well-appearing, NAD NEURO:  Alert and oriented to name and place, normal and symmetric strength and sensation, normal coordination, normal speech EYES:  eyes equal and reactive ENT/NECK:  no LAD, no JVD CARDIO: Regular rate, well-perfused, normal S1 and S2 PULM:  CTAB no wheezing or rhonchi GI/GU:  normal bowel sounds, non-distended, non-tender MSK/SPINE:  No gross deformities, no edema SKIN: Skin tear to left wrist, bandage in place PSYCH:  Appropriate speech and behavior  *Additional and/or pertinent findings included in MDM below  Diagnostic and Interventional Summary    EKG Interpretation  Date/Time:    Ventricular Rate:    PR Interval:    QRS Duration:  QT Interval:    QTC Calculation:   R Axis:     Text Interpretation:         Labs Reviewed - No data to display  CT HEAD WO CONTRAST (5MM)  Final Result      Medications - No data to display   Procedures  /  Critical Care Procedures  ED Course and Medical Decision Making  I have reviewed the triage vital signs, the nursing notes, and pertinent available records from the EMR.  Listed above are laboratory and imaging tests that I personally ordered, reviewed, and interpreted and then considered in my medical decision making (see below for details).  Fall, reports head trauma, on blood thinners, CT head is reassuring, patient is ambulatory, normal range of motion of neck, back, arms and legs, nothing to suggest significant injury, reports a mechanical fall,  appropriate for discharge.       Barth Kirks. Sedonia Small, MD [place] [email or web address]  Final Clinical Impressions(s) / ED Diagnoses     ICD-10-CM   1. Fall, initial encounter  W19.[facility]       ED Discharge Orders     None        Discharge Instructions Discussed with and Provided to Patient:    Discharge Instructions      You were evaluated in the Emergency Department and after careful evaluation, we did not find any emergent condition requiring admission or further testing in the hospital.  Your exam/testing today was overall reassuring.  CT scan did not show any abnormalities.  Please return to the Emergency Department if you experience any worsening of your condition.  Thank you for allowing [place] to be a part of your care.        Maudie Flakes, MD 04/01/21 [phone]

## 2021-04-01 NOTE — Discharge Instructions (Addendum)
You were evaluated in the Emergency Department and after careful evaluation, we did not find any emergent condition requiring admission or further testing in the hospital.  Your exam/testing today was overall reassuring.  CT scan did not show any abnormalities.  Please return to the Emergency Department if you experience any worsening of your condition.  Thank you for allowing [place] to be a part of your care.

## 2021-04-04 DIAGNOSIS — R32 Unspecified urinary incontinence: Secondary | ICD-10-CM | POA: Diagnosis not present

## 2021-04-04 DIAGNOSIS — F0391 Unspecified dementia with behavioral disturbance: Secondary | ICD-10-CM | POA: Diagnosis not present

## 2021-04-05 DIAGNOSIS — I4821 Permanent atrial fibrillation: Secondary | ICD-10-CM | POA: Diagnosis not present

## 2021-04-05 DIAGNOSIS — R296 Repeated falls: Secondary | ICD-10-CM | POA: Diagnosis not present

## 2021-04-05 DIAGNOSIS — I5022 Chronic systolic (congestive) heart failure: Secondary | ICD-10-CM | POA: Diagnosis not present

## 2021-04-05 DIAGNOSIS — R2689 Other abnormalities of gait and mobility: Secondary | ICD-10-CM | POA: Diagnosis not present

## 2021-04-08 DIAGNOSIS — Z7901 Long term (current) use of anticoagulants: Secondary | ICD-10-CM | POA: Diagnosis not present

## 2021-04-08 DIAGNOSIS — I5022 Chronic systolic (congestive) heart failure: Secondary | ICD-10-CM | POA: Diagnosis not present

## 2021-04-08 DIAGNOSIS — I4821 Permanent atrial fibrillation: Secondary | ICD-10-CM | POA: Diagnosis not present

## 2021-04-08 DIAGNOSIS — R42 Dizziness and giddiness: Secondary | ICD-10-CM | POA: Diagnosis not present

## 2021-04-08 DIAGNOSIS — I69351 Hemiplegia and hemiparesis following cerebral infarction affecting right dominant side: Secondary | ICD-10-CM | POA: Diagnosis not present

## 2021-04-08 DIAGNOSIS — R296 Repeated falls: Secondary | ICD-10-CM | POA: Diagnosis not present

## 2021-04-08 DIAGNOSIS — E785 Hyperlipidemia, unspecified: Secondary | ICD-10-CM | POA: Diagnosis not present

## 2021-04-08 DIAGNOSIS — R2689 Other abnormalities of gait and mobility: Secondary | ICD-10-CM | POA: Diagnosis not present

## 2021-04-08 DIAGNOSIS — G301 Alzheimer's disease with late onset: Secondary | ICD-10-CM | POA: Diagnosis not present

## 2021-04-11 DIAGNOSIS — R42 Dizziness and giddiness: Secondary | ICD-10-CM | POA: Diagnosis not present

## 2021-04-11 DIAGNOSIS — R296 Repeated falls: Secondary | ICD-10-CM | POA: Diagnosis not present

## 2021-04-11 DIAGNOSIS — E785 Hyperlipidemia, unspecified: Secondary | ICD-10-CM | POA: Diagnosis not present

## 2021-04-11 DIAGNOSIS — I4821 Permanent atrial fibrillation: Secondary | ICD-10-CM | POA: Diagnosis not present

## 2021-04-11 DIAGNOSIS — Z7901 Long term (current) use of anticoagulants: Secondary | ICD-10-CM | POA: Diagnosis not present

## 2021-04-11 DIAGNOSIS — I69351 Hemiplegia and hemiparesis following cerebral infarction affecting right dominant side: Secondary | ICD-10-CM | POA: Diagnosis not present

## 2021-04-11 DIAGNOSIS — G301 Alzheimer's disease with late onset: Secondary | ICD-10-CM | POA: Diagnosis not present

## 2021-04-11 DIAGNOSIS — R2689 Other abnormalities of gait and mobility: Secondary | ICD-10-CM | POA: Diagnosis not present

## 2021-04-11 DIAGNOSIS — I5022 Chronic systolic (congestive) heart failure: Secondary | ICD-10-CM | POA: Diagnosis not present

## 2021-04-12 DIAGNOSIS — R296 Repeated falls: Secondary | ICD-10-CM | POA: Diagnosis not present

## 2021-04-12 DIAGNOSIS — I4821 Permanent atrial fibrillation: Secondary | ICD-10-CM | POA: Diagnosis not present

## 2021-04-12 DIAGNOSIS — I5022 Chronic systolic (congestive) heart failure: Secondary | ICD-10-CM | POA: Diagnosis not present

## 2021-04-12 DIAGNOSIS — R2689 Other abnormalities of gait and mobility: Secondary | ICD-10-CM | POA: Diagnosis not present

## 2021-04-12 DIAGNOSIS — G301 Alzheimer's disease with late onset: Secondary | ICD-10-CM | POA: Diagnosis not present

## 2021-04-12 DIAGNOSIS — R42 Dizziness and giddiness: Secondary | ICD-10-CM | POA: Diagnosis not present

## 2021-04-12 DIAGNOSIS — E785 Hyperlipidemia, unspecified: Secondary | ICD-10-CM | POA: Diagnosis not present

## 2021-04-12 DIAGNOSIS — I69351 Hemiplegia and hemiparesis following cerebral infarction affecting right dominant side: Secondary | ICD-10-CM | POA: Diagnosis not present

## 2021-04-12 DIAGNOSIS — Z7901 Long term (current) use of anticoagulants: Secondary | ICD-10-CM | POA: Diagnosis not present

## 2021-04-13 DIAGNOSIS — Z23 Encounter for immunization: Secondary | ICD-10-CM | POA: Diagnosis not present

## 2021-04-19 DIAGNOSIS — G301 Alzheimer's disease with late onset: Secondary | ICD-10-CM | POA: Diagnosis not present

## 2021-04-19 DIAGNOSIS — R2689 Other abnormalities of gait and mobility: Secondary | ICD-10-CM | POA: Diagnosis not present

## 2021-04-19 DIAGNOSIS — R42 Dizziness and giddiness: Secondary | ICD-10-CM | POA: Diagnosis not present

## 2021-04-19 DIAGNOSIS — I5022 Chronic systolic (congestive) heart failure: Secondary | ICD-10-CM | POA: Diagnosis not present

## 2021-04-19 DIAGNOSIS — I69351 Hemiplegia and hemiparesis following cerebral infarction affecting right dominant side: Secondary | ICD-10-CM | POA: Diagnosis not present

## 2021-04-19 DIAGNOSIS — R296 Repeated falls: Secondary | ICD-10-CM | POA: Diagnosis not present

## 2021-04-19 DIAGNOSIS — I4821 Permanent atrial fibrillation: Secondary | ICD-10-CM | POA: Diagnosis not present

## 2021-04-19 DIAGNOSIS — Z7901 Long term (current) use of anticoagulants: Secondary | ICD-10-CM | POA: Diagnosis not present

## 2021-04-19 DIAGNOSIS — E785 Hyperlipidemia, unspecified: Secondary | ICD-10-CM | POA: Diagnosis not present

## 2021-04-20 DIAGNOSIS — I69351 Hemiplegia and hemiparesis following cerebral infarction affecting right dominant side: Secondary | ICD-10-CM | POA: Diagnosis not present

## 2021-04-20 DIAGNOSIS — G301 Alzheimer's disease with late onset: Secondary | ICD-10-CM | POA: Diagnosis not present

## 2021-04-20 DIAGNOSIS — Z7901 Long term (current) use of anticoagulants: Secondary | ICD-10-CM | POA: Diagnosis not present

## 2021-04-20 DIAGNOSIS — R296 Repeated falls: Secondary | ICD-10-CM | POA: Diagnosis not present

## 2021-04-20 DIAGNOSIS — R2689 Other abnormalities of gait and mobility: Secondary | ICD-10-CM | POA: Diagnosis not present

## 2021-04-20 DIAGNOSIS — I4821 Permanent atrial fibrillation: Secondary | ICD-10-CM | POA: Diagnosis not present

## 2021-04-20 DIAGNOSIS — R42 Dizziness and giddiness: Secondary | ICD-10-CM | POA: Diagnosis not present

## 2021-04-20 DIAGNOSIS — E785 Hyperlipidemia, unspecified: Secondary | ICD-10-CM | POA: Diagnosis not present

## 2021-04-20 DIAGNOSIS — I5022 Chronic systolic (congestive) heart failure: Secondary | ICD-10-CM | POA: Diagnosis not present

## 2021-04-21 DIAGNOSIS — Z7901 Long term (current) use of anticoagulants: Secondary | ICD-10-CM | POA: Diagnosis not present

## 2021-04-21 DIAGNOSIS — R2689 Other abnormalities of gait and mobility: Secondary | ICD-10-CM | POA: Diagnosis not present

## 2021-04-21 DIAGNOSIS — R42 Dizziness and giddiness: Secondary | ICD-10-CM | POA: Diagnosis not present

## 2021-04-21 DIAGNOSIS — E785 Hyperlipidemia, unspecified: Secondary | ICD-10-CM | POA: Diagnosis not present

## 2021-04-21 DIAGNOSIS — I4821 Permanent atrial fibrillation: Secondary | ICD-10-CM | POA: Diagnosis not present

## 2021-04-21 DIAGNOSIS — I69351 Hemiplegia and hemiparesis following cerebral infarction affecting right dominant side: Secondary | ICD-10-CM | POA: Diagnosis not present

## 2021-04-21 DIAGNOSIS — I5022 Chronic systolic (congestive) heart failure: Secondary | ICD-10-CM | POA: Diagnosis not present

## 2021-04-21 DIAGNOSIS — R296 Repeated falls: Secondary | ICD-10-CM | POA: Diagnosis not present

## 2021-04-21 DIAGNOSIS — G301 Alzheimer's disease with late onset: Secondary | ICD-10-CM | POA: Diagnosis not present

## 2021-04-23 DIAGNOSIS — I5022 Chronic systolic (congestive) heart failure: Secondary | ICD-10-CM | POA: Diagnosis not present

## 2021-04-23 DIAGNOSIS — G301 Alzheimer's disease with late onset: Secondary | ICD-10-CM | POA: Diagnosis not present

## 2021-04-25 DIAGNOSIS — R296 Repeated falls: Secondary | ICD-10-CM | POA: Diagnosis not present

## 2021-04-25 DIAGNOSIS — I5022 Chronic systolic (congestive) heart failure: Secondary | ICD-10-CM | POA: Diagnosis not present

## 2021-04-25 DIAGNOSIS — I4821 Permanent atrial fibrillation: Secondary | ICD-10-CM | POA: Diagnosis not present

## 2021-04-25 DIAGNOSIS — I69351 Hemiplegia and hemiparesis following cerebral infarction affecting right dominant side: Secondary | ICD-10-CM | POA: Diagnosis not present

## 2021-04-25 DIAGNOSIS — R42 Dizziness and giddiness: Secondary | ICD-10-CM | POA: Diagnosis not present

## 2021-04-25 DIAGNOSIS — G301 Alzheimer's disease with late onset: Secondary | ICD-10-CM | POA: Diagnosis not present

## 2021-04-25 DIAGNOSIS — R2689 Other abnormalities of gait and mobility: Secondary | ICD-10-CM | POA: Diagnosis not present

## 2021-04-25 DIAGNOSIS — U071 COVID-19: Secondary | ICD-10-CM | POA: Diagnosis not present

## 2021-04-25 DIAGNOSIS — E785 Hyperlipidemia, unspecified: Secondary | ICD-10-CM | POA: Diagnosis not present

## 2021-04-25 DIAGNOSIS — Z20828 Contact with and (suspected) exposure to other viral communicable diseases: Secondary | ICD-10-CM | POA: Diagnosis not present

## 2021-04-25 DIAGNOSIS — Z7901 Long term (current) use of anticoagulants: Secondary | ICD-10-CM | POA: Diagnosis not present

## 2021-04-26 DIAGNOSIS — I4821 Permanent atrial fibrillation: Secondary | ICD-10-CM | POA: Diagnosis not present

## 2021-04-26 DIAGNOSIS — R42 Dizziness and giddiness: Secondary | ICD-10-CM | POA: Diagnosis not present

## 2021-04-26 DIAGNOSIS — Z7689 Persons encountering health services in other specified circumstances: Secondary | ICD-10-CM | POA: Diagnosis not present

## 2021-04-26 DIAGNOSIS — I5022 Chronic systolic (congestive) heart failure: Secondary | ICD-10-CM | POA: Diagnosis not present

## 2021-04-26 DIAGNOSIS — G301 Alzheimer's disease with late onset: Secondary | ICD-10-CM | POA: Diagnosis not present

## 2021-04-26 DIAGNOSIS — Z7901 Long term (current) use of anticoagulants: Secondary | ICD-10-CM | POA: Diagnosis not present

## 2021-04-26 DIAGNOSIS — R2689 Other abnormalities of gait and mobility: Secondary | ICD-10-CM | POA: Diagnosis not present

## 2021-04-26 DIAGNOSIS — E785 Hyperlipidemia, unspecified: Secondary | ICD-10-CM | POA: Diagnosis not present

## 2021-04-26 DIAGNOSIS — R296 Repeated falls: Secondary | ICD-10-CM | POA: Diagnosis not present

## 2021-04-26 DIAGNOSIS — I69351 Hemiplegia and hemiparesis following cerebral infarction affecting right dominant side: Secondary | ICD-10-CM | POA: Diagnosis not present

## 2021-04-27 DIAGNOSIS — Z7901 Long term (current) use of anticoagulants: Secondary | ICD-10-CM | POA: Diagnosis not present

## 2021-04-27 DIAGNOSIS — R296 Repeated falls: Secondary | ICD-10-CM | POA: Diagnosis not present

## 2021-04-27 DIAGNOSIS — I5022 Chronic systolic (congestive) heart failure: Secondary | ICD-10-CM | POA: Diagnosis not present

## 2021-04-27 DIAGNOSIS — E785 Hyperlipidemia, unspecified: Secondary | ICD-10-CM | POA: Diagnosis not present

## 2021-04-27 DIAGNOSIS — R2689 Other abnormalities of gait and mobility: Secondary | ICD-10-CM | POA: Diagnosis not present

## 2021-04-27 DIAGNOSIS — I4821 Permanent atrial fibrillation: Secondary | ICD-10-CM | POA: Diagnosis not present

## 2021-04-27 DIAGNOSIS — G301 Alzheimer's disease with late onset: Secondary | ICD-10-CM | POA: Diagnosis not present

## 2021-04-27 DIAGNOSIS — R42 Dizziness and giddiness: Secondary | ICD-10-CM | POA: Diagnosis not present

## 2021-04-27 DIAGNOSIS — I69351 Hemiplegia and hemiparesis following cerebral infarction affecting right dominant side: Secondary | ICD-10-CM | POA: Diagnosis not present

## 2021-04-28 DIAGNOSIS — I69351 Hemiplegia and hemiparesis following cerebral infarction affecting right dominant side: Secondary | ICD-10-CM | POA: Diagnosis not present

## 2021-04-28 DIAGNOSIS — E785 Hyperlipidemia, unspecified: Secondary | ICD-10-CM | POA: Diagnosis not present

## 2021-04-28 DIAGNOSIS — Z7901 Long term (current) use of anticoagulants: Secondary | ICD-10-CM | POA: Diagnosis not present

## 2021-04-28 DIAGNOSIS — I4821 Permanent atrial fibrillation: Secondary | ICD-10-CM | POA: Diagnosis not present

## 2021-04-28 DIAGNOSIS — R42 Dizziness and giddiness: Secondary | ICD-10-CM | POA: Diagnosis not present

## 2021-04-28 DIAGNOSIS — R2689 Other abnormalities of gait and mobility: Secondary | ICD-10-CM | POA: Diagnosis not present

## 2021-04-28 DIAGNOSIS — R296 Repeated falls: Secondary | ICD-10-CM | POA: Diagnosis not present

## 2021-04-28 DIAGNOSIS — G301 Alzheimer's disease with late onset: Secondary | ICD-10-CM | POA: Diagnosis not present

## 2021-04-28 DIAGNOSIS — I5022 Chronic systolic (congestive) heart failure: Secondary | ICD-10-CM | POA: Diagnosis not present

## 2021-05-02 DIAGNOSIS — R32 Unspecified urinary incontinence: Secondary | ICD-10-CM | POA: Diagnosis not present

## 2021-05-02 DIAGNOSIS — R296 Repeated falls: Secondary | ICD-10-CM | POA: Diagnosis not present

## 2021-05-02 DIAGNOSIS — Z7901 Long term (current) use of anticoagulants: Secondary | ICD-10-CM | POA: Diagnosis not present

## 2021-05-02 DIAGNOSIS — I4821 Permanent atrial fibrillation: Secondary | ICD-10-CM | POA: Diagnosis not present

## 2021-05-02 DIAGNOSIS — G301 Alzheimer's disease with late onset: Secondary | ICD-10-CM | POA: Diagnosis not present

## 2021-05-02 DIAGNOSIS — E785 Hyperlipidemia, unspecified: Secondary | ICD-10-CM | POA: Diagnosis not present

## 2021-05-02 DIAGNOSIS — R2689 Other abnormalities of gait and mobility: Secondary | ICD-10-CM | POA: Diagnosis not present

## 2021-05-02 DIAGNOSIS — F03918 Unspecified dementia, unspecified severity, with other behavioral disturbance: Secondary | ICD-10-CM | POA: Diagnosis not present

## 2021-05-02 DIAGNOSIS — R42 Dizziness and giddiness: Secondary | ICD-10-CM | POA: Diagnosis not present

## 2021-05-02 DIAGNOSIS — I5022 Chronic systolic (congestive) heart failure: Secondary | ICD-10-CM | POA: Diagnosis not present

## 2021-05-02 DIAGNOSIS — I69351 Hemiplegia and hemiparesis following cerebral infarction affecting right dominant side: Secondary | ICD-10-CM | POA: Diagnosis not present

## 2021-05-04 DIAGNOSIS — U071 COVID-19: Secondary | ICD-10-CM | POA: Diagnosis not present

## 2021-05-04 DIAGNOSIS — Z20828 Contact with and (suspected) exposure to other viral communicable diseases: Secondary | ICD-10-CM | POA: Diagnosis not present

## 2021-05-05 DIAGNOSIS — I4821 Permanent atrial fibrillation: Secondary | ICD-10-CM | POA: Diagnosis not present

## 2021-05-05 DIAGNOSIS — R2689 Other abnormalities of gait and mobility: Secondary | ICD-10-CM | POA: Diagnosis not present

## 2021-05-05 DIAGNOSIS — Z7901 Long term (current) use of anticoagulants: Secondary | ICD-10-CM | POA: Diagnosis not present

## 2021-05-05 DIAGNOSIS — I5022 Chronic systolic (congestive) heart failure: Secondary | ICD-10-CM | POA: Diagnosis not present

## 2021-05-05 DIAGNOSIS — R42 Dizziness and giddiness: Secondary | ICD-10-CM | POA: Diagnosis not present

## 2021-05-05 DIAGNOSIS — E785 Hyperlipidemia, unspecified: Secondary | ICD-10-CM | POA: Diagnosis not present

## 2021-05-05 DIAGNOSIS — G301 Alzheimer's disease with late onset: Secondary | ICD-10-CM | POA: Diagnosis not present

## 2021-05-05 DIAGNOSIS — I69351 Hemiplegia and hemiparesis following cerebral infarction affecting right dominant side: Secondary | ICD-10-CM | POA: Diagnosis not present

## 2021-05-05 DIAGNOSIS — R296 Repeated falls: Secondary | ICD-10-CM | POA: Diagnosis not present

## 2021-05-10 DIAGNOSIS — R2689 Other abnormalities of gait and mobility: Secondary | ICD-10-CM | POA: Diagnosis not present

## 2021-05-10 DIAGNOSIS — G301 Alzheimer's disease with late onset: Secondary | ICD-10-CM | POA: Diagnosis not present

## 2021-05-10 DIAGNOSIS — R296 Repeated falls: Secondary | ICD-10-CM | POA: Diagnosis not present

## 2021-05-10 DIAGNOSIS — Z7901 Long term (current) use of anticoagulants: Secondary | ICD-10-CM | POA: Diagnosis not present

## 2021-05-10 DIAGNOSIS — I5022 Chronic systolic (congestive) heart failure: Secondary | ICD-10-CM | POA: Diagnosis not present

## 2021-05-10 DIAGNOSIS — I4821 Permanent atrial fibrillation: Secondary | ICD-10-CM | POA: Diagnosis not present

## 2021-05-10 DIAGNOSIS — R42 Dizziness and giddiness: Secondary | ICD-10-CM | POA: Diagnosis not present

## 2021-05-10 DIAGNOSIS — I69351 Hemiplegia and hemiparesis following cerebral infarction affecting right dominant side: Secondary | ICD-10-CM | POA: Diagnosis not present

## 2021-05-10 DIAGNOSIS — E785 Hyperlipidemia, unspecified: Secondary | ICD-10-CM | POA: Diagnosis not present

## 2021-05-11 DIAGNOSIS — R42 Dizziness and giddiness: Secondary | ICD-10-CM | POA: Diagnosis not present

## 2021-05-11 DIAGNOSIS — G301 Alzheimer's disease with late onset: Secondary | ICD-10-CM | POA: Diagnosis not present

## 2021-05-11 DIAGNOSIS — R296 Repeated falls: Secondary | ICD-10-CM | POA: Diagnosis not present

## 2021-05-11 DIAGNOSIS — Z7901 Long term (current) use of anticoagulants: Secondary | ICD-10-CM | POA: Diagnosis not present

## 2021-05-11 DIAGNOSIS — I69351 Hemiplegia and hemiparesis following cerebral infarction affecting right dominant side: Secondary | ICD-10-CM | POA: Diagnosis not present

## 2021-05-11 DIAGNOSIS — I4821 Permanent atrial fibrillation: Secondary | ICD-10-CM | POA: Diagnosis not present

## 2021-05-11 DIAGNOSIS — U071 COVID-19: Secondary | ICD-10-CM | POA: Diagnosis not present

## 2021-05-11 DIAGNOSIS — E785 Hyperlipidemia, unspecified: Secondary | ICD-10-CM | POA: Diagnosis not present

## 2021-05-11 DIAGNOSIS — R2689 Other abnormalities of gait and mobility: Secondary | ICD-10-CM | POA: Diagnosis not present

## 2021-05-11 DIAGNOSIS — Z20828 Contact with and (suspected) exposure to other viral communicable diseases: Secondary | ICD-10-CM | POA: Diagnosis not present

## 2021-05-11 DIAGNOSIS — I5022 Chronic systolic (congestive) heart failure: Secondary | ICD-10-CM | POA: Diagnosis not present

## 2021-05-16 DIAGNOSIS — E785 Hyperlipidemia, unspecified: Secondary | ICD-10-CM | POA: Diagnosis not present

## 2021-05-16 DIAGNOSIS — Z7901 Long term (current) use of anticoagulants: Secondary | ICD-10-CM | POA: Diagnosis not present

## 2021-05-16 DIAGNOSIS — I69351 Hemiplegia and hemiparesis following cerebral infarction affecting right dominant side: Secondary | ICD-10-CM | POA: Diagnosis not present

## 2021-05-16 DIAGNOSIS — I5022 Chronic systolic (congestive) heart failure: Secondary | ICD-10-CM | POA: Diagnosis not present

## 2021-05-16 DIAGNOSIS — R2689 Other abnormalities of gait and mobility: Secondary | ICD-10-CM | POA: Diagnosis not present

## 2021-05-16 DIAGNOSIS — R296 Repeated falls: Secondary | ICD-10-CM | POA: Diagnosis not present

## 2021-05-16 DIAGNOSIS — G301 Alzheimer's disease with late onset: Secondary | ICD-10-CM | POA: Diagnosis not present

## 2021-05-16 DIAGNOSIS — R42 Dizziness and giddiness: Secondary | ICD-10-CM | POA: Diagnosis not present

## 2021-05-16 DIAGNOSIS — I4821 Permanent atrial fibrillation: Secondary | ICD-10-CM | POA: Diagnosis not present

## 2021-05-17 DIAGNOSIS — G301 Alzheimer's disease with late onset: Secondary | ICD-10-CM | POA: Diagnosis not present

## 2021-05-17 DIAGNOSIS — I4821 Permanent atrial fibrillation: Secondary | ICD-10-CM | POA: Diagnosis not present

## 2021-05-17 DIAGNOSIS — Z7901 Long term (current) use of anticoagulants: Secondary | ICD-10-CM | POA: Diagnosis not present

## 2021-05-17 DIAGNOSIS — R42 Dizziness and giddiness: Secondary | ICD-10-CM | POA: Diagnosis not present

## 2021-05-17 DIAGNOSIS — R296 Repeated falls: Secondary | ICD-10-CM | POA: Diagnosis not present

## 2021-05-17 DIAGNOSIS — I69351 Hemiplegia and hemiparesis following cerebral infarction affecting right dominant side: Secondary | ICD-10-CM | POA: Diagnosis not present

## 2021-05-17 DIAGNOSIS — R2689 Other abnormalities of gait and mobility: Secondary | ICD-10-CM | POA: Diagnosis not present

## 2021-05-17 DIAGNOSIS — I5022 Chronic systolic (congestive) heart failure: Secondary | ICD-10-CM | POA: Diagnosis not present

## 2021-05-17 DIAGNOSIS — E785 Hyperlipidemia, unspecified: Secondary | ICD-10-CM | POA: Diagnosis not present

## 2021-05-18 DIAGNOSIS — Z20828 Contact with and (suspected) exposure to other viral communicable diseases: Secondary | ICD-10-CM | POA: Diagnosis not present

## 2021-05-18 DIAGNOSIS — U071 COVID-19: Secondary | ICD-10-CM | POA: Diagnosis not present

## 2021-05-23 DIAGNOSIS — G301 Alzheimer's disease with late onset: Secondary | ICD-10-CM | POA: Diagnosis not present

## 2021-05-23 DIAGNOSIS — R42 Dizziness and giddiness: Secondary | ICD-10-CM | POA: Diagnosis not present

## 2021-05-23 DIAGNOSIS — I4821 Permanent atrial fibrillation: Secondary | ICD-10-CM | POA: Diagnosis not present

## 2021-05-23 DIAGNOSIS — R2689 Other abnormalities of gait and mobility: Secondary | ICD-10-CM | POA: Diagnosis not present

## 2021-05-23 DIAGNOSIS — Z7901 Long term (current) use of anticoagulants: Secondary | ICD-10-CM | POA: Diagnosis not present

## 2021-05-23 DIAGNOSIS — I5022 Chronic systolic (congestive) heart failure: Secondary | ICD-10-CM | POA: Diagnosis not present

## 2021-05-23 DIAGNOSIS — R296 Repeated falls: Secondary | ICD-10-CM | POA: Diagnosis not present

## 2021-05-23 DIAGNOSIS — I69351 Hemiplegia and hemiparesis following cerebral infarction affecting right dominant side: Secondary | ICD-10-CM | POA: Diagnosis not present

## 2021-05-23 DIAGNOSIS — E785 Hyperlipidemia, unspecified: Secondary | ICD-10-CM | POA: Diagnosis not present

## 2021-05-24 DIAGNOSIS — E785 Hyperlipidemia, unspecified: Secondary | ICD-10-CM | POA: Diagnosis not present

## 2021-05-24 DIAGNOSIS — N182 Chronic kidney disease, stage 2 (mild): Secondary | ICD-10-CM | POA: Diagnosis not present

## 2021-05-24 DIAGNOSIS — U071 COVID-19: Secondary | ICD-10-CM | POA: Diagnosis not present

## 2021-05-24 DIAGNOSIS — Z20828 Contact with and (suspected) exposure to other viral communicable diseases: Secondary | ICD-10-CM | POA: Diagnosis not present

## 2021-05-25 DIAGNOSIS — G301 Alzheimer's disease with late onset: Secondary | ICD-10-CM | POA: Diagnosis not present

## 2021-05-25 DIAGNOSIS — E785 Hyperlipidemia, unspecified: Secondary | ICD-10-CM | POA: Diagnosis not present

## 2021-05-25 DIAGNOSIS — Z7901 Long term (current) use of anticoagulants: Secondary | ICD-10-CM | POA: Diagnosis not present

## 2021-05-25 DIAGNOSIS — I4821 Permanent atrial fibrillation: Secondary | ICD-10-CM | POA: Diagnosis not present

## 2021-05-25 DIAGNOSIS — R296 Repeated falls: Secondary | ICD-10-CM | POA: Diagnosis not present

## 2021-05-25 DIAGNOSIS — R2689 Other abnormalities of gait and mobility: Secondary | ICD-10-CM | POA: Diagnosis not present

## 2021-05-25 DIAGNOSIS — I69351 Hemiplegia and hemiparesis following cerebral infarction affecting right dominant side: Secondary | ICD-10-CM | POA: Diagnosis not present

## 2021-05-25 DIAGNOSIS — R42 Dizziness and giddiness: Secondary | ICD-10-CM | POA: Diagnosis not present

## 2021-05-25 DIAGNOSIS — I5022 Chronic systolic (congestive) heart failure: Secondary | ICD-10-CM | POA: Diagnosis not present

## 2021-05-30 DIAGNOSIS — U071 COVID-19: Secondary | ICD-10-CM | POA: Diagnosis not present

## 2021-05-30 DIAGNOSIS — R32 Unspecified urinary incontinence: Secondary | ICD-10-CM | POA: Diagnosis not present

## 2021-05-30 DIAGNOSIS — Z20828 Contact with and (suspected) exposure to other viral communicable diseases: Secondary | ICD-10-CM | POA: Diagnosis not present

## 2021-05-30 DIAGNOSIS — F03918 Unspecified dementia, unspecified severity, with other behavioral disturbance: Secondary | ICD-10-CM | POA: Diagnosis not present

## 2021-06-07 DIAGNOSIS — U071 COVID-19: Secondary | ICD-10-CM | POA: Diagnosis not present

## 2021-06-07 DIAGNOSIS — Z20828 Contact with and (suspected) exposure to other viral communicable diseases: Secondary | ICD-10-CM | POA: Diagnosis not present

## 2021-06-23 DIAGNOSIS — N182 Chronic kidney disease, stage 2 (mild): Secondary | ICD-10-CM | POA: Diagnosis not present

## 2021-06-23 DIAGNOSIS — I5022 Chronic systolic (congestive) heart failure: Secondary | ICD-10-CM | POA: Diagnosis not present

## 2021-06-30 DIAGNOSIS — R32 Unspecified urinary incontinence: Secondary | ICD-10-CM | POA: Diagnosis not present

## 2021-06-30 DIAGNOSIS — F03918 Unspecified dementia, unspecified severity, with other behavioral disturbance: Secondary | ICD-10-CM | POA: Diagnosis not present

## 2021-07-24 DIAGNOSIS — N182 Chronic kidney disease, stage 2 (mild): Secondary | ICD-10-CM | POA: Diagnosis not present

## 2021-07-24 DIAGNOSIS — I480 Paroxysmal atrial fibrillation: Secondary | ICD-10-CM | POA: Diagnosis not present

## 2021-07-26 ENCOUNTER — Other Ambulatory Visit: Payer: Self-pay

## 2021-07-26 ENCOUNTER — Encounter (HOSPITAL_COMMUNITY): Payer: Self-pay | Admitting: Emergency Medicine

## 2021-07-26 ENCOUNTER — Emergency Department (HOSPITAL_COMMUNITY)
Admission: EM | Admit: 2021-07-26 | Discharge: 2021-07-26 | Disposition: A | Discharge status: Skilled Nursing Facility | Payer: Medicare HMO | Attending: Emergency Medicine | Admitting: Emergency Medicine

## 2021-07-26 ENCOUNTER — Emergency Department (HOSPITAL_COMMUNITY): Payer: Medicare HMO

## 2021-07-26 DIAGNOSIS — M549 Dorsalgia, unspecified: Secondary | ICD-10-CM | POA: Diagnosis not present

## 2021-07-26 DIAGNOSIS — R1111 Vomiting without nausea: Secondary | ICD-10-CM

## 2021-07-26 DIAGNOSIS — G8911 Acute pain due to trauma: Secondary | ICD-10-CM | POA: Insufficient documentation

## 2021-07-26 DIAGNOSIS — Z20822 Contact with and (suspected) exposure to covid-19: Secondary | ICD-10-CM | POA: Diagnosis not present

## 2021-07-26 DIAGNOSIS — I517 Cardiomegaly: Secondary | ICD-10-CM | POA: Diagnosis not present

## 2021-07-26 DIAGNOSIS — W06XXXA Fall from bed, initial encounter: Secondary | ICD-10-CM | POA: Insufficient documentation

## 2021-07-26 DIAGNOSIS — M545 Low back pain, unspecified: Secondary | ICD-10-CM | POA: Diagnosis not present

## 2021-07-26 DIAGNOSIS — W19XXXA Unspecified fall, initial encounter: Secondary | ICD-10-CM

## 2021-07-26 DIAGNOSIS — R112 Nausea with vomiting, unspecified: Secondary | ICD-10-CM | POA: Diagnosis not present

## 2021-07-26 DIAGNOSIS — Z7901 Long term (current) use of anticoagulants: Secondary | ICD-10-CM | POA: Insufficient documentation

## 2021-07-26 DIAGNOSIS — R1110 Vomiting, unspecified: Secondary | ICD-10-CM | POA: Insufficient documentation

## 2021-07-26 LAB — COMPREHENSIVE METABOLIC PANEL
ALT: 10 U/L (ref 0–44)
AST: 22 U/L (ref 15–41)
Albumin: 3.9 g/dL (ref 3.5–5.0)
Alkaline Phosphatase: 102 U/L (ref 38–126)
Anion gap: 8 (ref 5–15)
BUN: 25 mg/dL — ABNORMAL HIGH (ref 8–23)
CO2: 30 mmol/L (ref 22–32)
Calcium: 9.1 mg/dL (ref 8.9–10.3)
Chloride: 104 mmol/L (ref 98–111)
Creatinine, Ser: 1.15 mg/dL (ref 0.61–1.24)
GFR, Estimated: >60 mL/min (ref >60)
Glucose, Bld: 113 mg/dL — ABNORMAL HIGH (ref 70–99)
Potassium: 3.8 mmol/L (ref 3.5–5.1)
Sodium: 142 mmol/L (ref 135–145)
Total Bilirubin: 0.9 mg/dL (ref 0.3–1.2)
Total Protein: 7.1 g/dL (ref 6.5–8.1)

## 2021-07-26 LAB — CBC WITH DIFFERENTIAL/PLATELET
Abs Immature Granulocytes: 0.02 10*3/uL (ref 0.00–0.07)
Basophils Absolute: 0 10*3/uL (ref 0.0–0.1)
Basophils Relative: 0 %
Eosinophils Absolute: 0.1 10*3/uL (ref 0.0–0.5)
Eosinophils Relative: 1 %
HCT: 41.1 % (ref 39.0–52.0)
Hemoglobin: 13.8 g/dL (ref 13.0–17.0)
Immature Granulocytes: 0 %
Lymphocytes Relative: 13 %
Lymphs Abs: 1 10*3/uL (ref 0.7–4.0)
MCH: 33.5 pg (ref 26.0–34.0)
MCHC: 33.6 g/dL (ref 30.0–36.0)
MCV: 99.8 fL (ref 80.0–100.0)
Monocytes Absolute: 0.6 10*3/uL (ref 0.1–1.0)
Monocytes Relative: 7 %
Neutro Abs: 6.2 10*3/uL (ref 1.7–7.7)
Neutrophils Relative %: 79 %
Platelets: 172 10*3/uL (ref 150–400)
RBC: 4.12 MIL/uL — ABNORMAL LOW (ref 4.22–5.81)
RDW: 12.5 % (ref 11.5–15.5)
WBC: 7.9 10*3/uL (ref 4.0–10.5)
nRBC: 0 % (ref 0.0–0.2)

## 2021-07-26 LAB — LIPASE, BLOOD: Lipase: 30 U/L (ref 11–51)

## 2021-07-26 LAB — RESP PANEL BY RT-PCR (FLU A&B, COVID) ARPGX2
Influenza A by PCR: NEGATIVE
Influenza B by PCR: NEGATIVE
SARS Coronavirus 2 by RT PCR: NEGATIVE

## 2021-07-26 MED ORDER — SODIUM CHLORIDE 0.9 % IV BOLUS
250.0000 mL | Freq: Once | INTRAVENOUS | Status: AC
  Administered 2021-07-26: 250 mL via INTRAVENOUS

## 2021-07-26 MED ORDER — ONDANSETRON HCL 4 MG/2ML IJ SOLN
4.0000 mg | Freq: Once | INTRAMUSCULAR | Status: AC
  Administered 2021-07-26: 4 mg via INTRAVENOUS
  Filled 2021-07-26: qty 2

## 2021-07-26 NOTE — ED Provider Notes (Signed)
[facility] Provider Note   CSN: [id number] Arrival date & time: 07/26/21  9211     History  Chief Complaint  Patient presents with   [name] is a 88 y.o. male.  HPI Patient arrives from his nursing care facility for evaluation of lower back pain after a fall from bed.  He complains of low back pain after the fall.  Shortly after arrival in the emergency department he vomited.    Home Medications Prior to Admission medications   Medication Sig Start Date End Date Taking? Authorizing Provider  acetaminophen (TYLENOL) 325 MG tablet Take 2 tablets (650 mg total) by mouth every 6 (six) hours. 11/03/20   Johnson, Clanford L, MD  atorvastatin (LIPITOR) 20 MG tablet Take 1 tablet (20 mg total) by mouth daily at 6 PM. 09/14/18   Rinehuls, Early Chars, PA-C  furosemide (LASIX) 20 MG tablet Take 20 mg by mouth every other day. 10/14/20   [provider]  loratadine (CLARITIN) 10 MG tablet Take 10 mg by mouth daily.    [provider]  midodrine (PROAMATINE) 5 MG tablet Take 5 mg by mouth 2 (two) times daily with a meal.    [provider]  Polyethylene Glycol 3350 (MIRALAX PO) Take by mouth. Take as needed for constipation    [provider]  potassium chloride (KLOR-CON) 10 MEQ tablet Take 10 mEq by mouth every other day. 10/14/20   [provider]  Rivaroxaban (XARELTO) 15 MG TABS tablet Take 15 mg by mouth daily with supper.    [provider]  vitamin B-12 (CYANOCOBALAMIN) 1000 MCG tablet Take 1 tablet (1,000 mcg total) by mouth daily. 11/04/20   Murlean Iba, MD      Allergies    Patient has no known allergies.    Review of Systems   Review of Systems  All other systems reviewed and are negative.  Physical Exam Updated Vital Signs BP 135/82 (BP Location: Left Arm)    Pulse 95    Temp 98.7 F (37.1 C) (Oral)    Resp 18    Ht 5\' 8"  (1.727 m)    Wt 76.5 kg    SpO2 99%    BMI 25.65 kg/m   Physical Exam Vitals and nursing note reviewed.  Constitutional:      General: He is not in acute distress.    Appearance: He is well-developed. He is not ill-appearing or diaphoretic.     Comments: Well-developed well-nourished in no apparent distress  HENT:     Head: Normocephalic and atraumatic.     Right Ear: External ear normal.     Left Ear: External ear normal.     Nose: No congestion or rhinorrhea.  Eyes:     Conjunctiva/sclera: Conjunctivae normal.     Pupils: Pupils are equal, round, and reactive to light.  Neck:     Trachea: Phonation normal.  Cardiovascular:     Rate and Rhythm: Normal rate and regular rhythm.     Heart sounds: Normal heart sounds.  Pulmonary:     Effort: Pulmonary effort is normal.     Breath sounds: Normal breath sounds.  Abdominal:     Palpations: Abdomen is soft.     Tenderness: There is no abdominal tenderness.  Musculoskeletal:        General: Normal range of motion.     Cervical back: Normal range of motion and neck supple.     Comments: Normal  range of motion arms and legs bilaterally.  Nontender back to palpation.  No pain with movement.  Able to walk without limitation.  Skin:    General: Skin is warm and dry.  Neurological:     Mental Status: He is alert.     Cranial Nerves: No cranial nerve deficit.     Sensory: No sensory deficit.     Motor: No abnormal muscle tone.     Coordination: Coordination normal.  Psychiatric:        Mood and Affect: Mood normal.        Behavior: Behavior normal.    ED Results / Procedures / Treatments   Labs (all labs ordered are listed, but only abnormal results are displayed) Labs Reviewed  COMPREHENSIVE METABOLIC PANEL - Abnormal; Notable for the following components:      Result Value   Glucose, Bld 113 (*)    BUN 25 (*)    All other components within normal limits  CBC WITH DIFFERENTIAL/PLATELET - Abnormal; Notable for the following components:   RBC 4.12 (*)    All other components within  normal limits  RESP PANEL BY RT-PCR (FLU A&B, COVID) ARPGX2  LIPASE, BLOOD    EKG None  Radiology DG Chest 1 View  Result Date: 07/26/2021 CLINICAL DATA:  Vomiting.  Fall. EXAM: CHEST  1 VIEW COMPARISON:  11/27/2020 FINDINGS: Midline trachea. Mild cardiomegaly. Atherosclerosis in the transverse aorta. No pleural effusion or pneumothorax. Chronic interstitial thickening is nonspecific in this age group and may be related to the remote history of smoking. No lobar consolidation. IMPRESSION: No acute cardiopulmonary disease. Cardiomegaly without congestive failure. Aortic Atherosclerosis (ICD10-I70.0). Electronically Signed   By: Abigail Miyamoto M.D.   On: 07/26/2021 09:16    Procedures Procedures    Medications Ordered in ED Medications  ondansetron (ZOFRAN) injection 4 mg (4 mg Intravenous Given 07/26/21 0857)  sodium chloride 0.9 % bolus 250 mL (0 mLs Intravenous Stopped 07/26/21 0911)    ED Course/ Medical Decision Making/ A&P Clinical Course as of 07/26/21 2017  Tue Jul 26, 2021  1023 He has been able to ambulate to the bathroom with assistance.  At this time he is sitting on the edge of the bed, calm and comfortable.  He denies back pain.  The back is nontender to palpation.  The abdomen is soft and nontender.  He does not appear nauseated and is not vomiting at this time.  We will recheck vitals and discharge is stable. [EW]  1024 He is not symptomatic at this time. [EW]  1036 Call placed to legal guardian.  No answer at that number, a message was left to have the guardian call me back. [EW]    Clinical Course User Index [EW] Daleen Bo, MD                           Medical Decision Making He is presenting for evaluation of possible injury to the low back after fall.  He has had a prior stroke and lives in a nursing care facility.  In the ED he is asymptomatic.  Problems Addressed: Fall, initial encounter: acute illness or injury    Details: Fell at his home Vomiting: acute  illness or injury    Details: Vomited in the ED  Amount and/or Complexity of Data Reviewed Independent Historian: caregiver External Data Reviewed: notes.    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 Labs: ordered.  Details: CBC and bmet, lipase and COVID test.  All are normal. Radiology: ordered.    Details: Chest x-ray, normal  Risk Prescription drug management. Decision regarding hospitalization. Risk Details: Patient treated with fluids and Zofran in the ED to control vomiting.  He did not have recurrent vomiting.  I suspect the vomiting started after he was transported by EMS, and resolved after he rested in the ED.  Screening evaluation does not indicate acute injuries or illnesses.  There is no indication for hospitalization at this time.  Attempted to contact his legal guardian but there was no answer.  Message was left.  Patient has moderate dementia and lives in a nursing care facility.  Symptomatic care is indicated.  OTC medication is recommended.           Final Clinical Impression(s) / ED Diagnoses Final diagnoses:  Fall, initial encounter  Vomiting without nausea, unspecified vomiting type    Rx / DC Orders ED Discharge Orders     None         Daleen Bo, MD 07/26/21 2020

## 2021-07-26 NOTE — Discharge Instructions (Signed)
That does not appear to be any injuries from the fall.  It is not clear why he vomited.  Stay on a clear liquid diet for 12 to 24 hours until your stomach feels better and you can drink liquids and eat without vomiting.  Return here if needed.

## 2021-07-26 NOTE — ED Triage Notes (Signed)
Pt arrives from [facility] with c/o lower back pain following a fall from bed. Pt states he did not hit head but lower back hurts when he moves. Fall was unwitnessed. Mild confusion at baseline per ems. Hx of falls. Pt has Legal Guardian Amiri Riechers [phone]

## 2021-08-02 DIAGNOSIS — F03918 Unspecified dementia, unspecified severity, with other behavioral disturbance: Secondary | ICD-10-CM | POA: Diagnosis not present

## 2021-08-02 DIAGNOSIS — R32 Unspecified urinary incontinence: Secondary | ICD-10-CM | POA: Diagnosis not present

## 2021-08-08 DIAGNOSIS — W19XXXD Unspecified fall, subsequent encounter: Secondary | ICD-10-CM | POA: Diagnosis not present

## 2021-08-08 DIAGNOSIS — I480 Paroxysmal atrial fibrillation: Secondary | ICD-10-CM | POA: Diagnosis not present

## 2021-08-08 DIAGNOSIS — G301 Alzheimer's disease with late onset: Secondary | ICD-10-CM | POA: Diagnosis not present

## 2021-08-08 DIAGNOSIS — Z7689 Persons encountering health services in other specified circumstances: Secondary | ICD-10-CM | POA: Diagnosis not present

## 2021-08-08 DIAGNOSIS — M25661 Stiffness of right knee, not elsewhere classified: Secondary | ICD-10-CM | POA: Diagnosis not present

## 2021-08-10 DIAGNOSIS — N182 Chronic kidney disease, stage 2 (mild): Secondary | ICD-10-CM | POA: Diagnosis not present

## 2021-08-10 DIAGNOSIS — I5022 Chronic systolic (congestive) heart failure: Secondary | ICD-10-CM | POA: Diagnosis not present

## 2021-08-10 DIAGNOSIS — E785 Hyperlipidemia, unspecified: Secondary | ICD-10-CM | POA: Diagnosis not present

## 2021-08-31 DIAGNOSIS — F03918 Unspecified dementia, unspecified severity, with other behavioral disturbance: Secondary | ICD-10-CM | POA: Diagnosis not present

## 2021-08-31 DIAGNOSIS — R32 Unspecified urinary incontinence: Secondary | ICD-10-CM | POA: Diagnosis not present

## 2021-09-05 DIAGNOSIS — H353131 Nonexudative age-related macular degeneration, bilateral, early dry stage: Secondary | ICD-10-CM | POA: Diagnosis not present

## 2021-09-06 DIAGNOSIS — I480 Paroxysmal atrial fibrillation: Secondary | ICD-10-CM | POA: Diagnosis not present

## 2021-09-06 DIAGNOSIS — I5022 Chronic systolic (congestive) heart failure: Secondary | ICD-10-CM | POA: Diagnosis not present

## 2021-09-14 DIAGNOSIS — I480 Paroxysmal atrial fibrillation: Secondary | ICD-10-CM | POA: Diagnosis not present

## 2021-09-14 DIAGNOSIS — Z1389 Encounter for screening for other disorder: Secondary | ICD-10-CM | POA: Diagnosis not present

## 2021-09-14 DIAGNOSIS — I5022 Chronic systolic (congestive) heart failure: Secondary | ICD-10-CM | POA: Diagnosis not present

## 2021-09-14 DIAGNOSIS — G301 Alzheimer's disease with late onset: Secondary | ICD-10-CM | POA: Diagnosis not present

## 2021-09-14 DIAGNOSIS — Z0001 Encounter for general adult medical examination with abnormal findings: Secondary | ICD-10-CM | POA: Diagnosis not present

## 2021-09-14 DIAGNOSIS — Z1331 Encounter for screening for depression: Secondary | ICD-10-CM | POA: Diagnosis not present

## 2021-09-28 DIAGNOSIS — F03918 Unspecified dementia, unspecified severity, with other behavioral disturbance: Secondary | ICD-10-CM | POA: Diagnosis not present

## 2021-09-28 DIAGNOSIS — R32 Unspecified urinary incontinence: Secondary | ICD-10-CM | POA: Diagnosis not present

## 2021-10-05 ENCOUNTER — Other Ambulatory Visit: Payer: Self-pay

## 2021-10-05 ENCOUNTER — Emergency Department (HOSPITAL_COMMUNITY)
Admission: EM | Admit: 2021-10-05 | Discharge: 2021-10-05 | Disposition: A | Discharge status: Nursing Home (Includes ICF) | Payer: Medicare HMO | Attending: Student | Admitting: Student

## 2021-10-05 ENCOUNTER — Emergency Department (HOSPITAL_COMMUNITY): Payer: Medicare HMO

## 2021-10-05 ENCOUNTER — Encounter (HOSPITAL_COMMUNITY): Payer: Self-pay

## 2021-10-05 DIAGNOSIS — R404 Transient alteration of awareness: Secondary | ICD-10-CM | POA: Diagnosis not present

## 2021-10-05 DIAGNOSIS — F0390 Unspecified dementia without behavioral disturbance: Secondary | ICD-10-CM | POA: Diagnosis not present

## 2021-10-05 DIAGNOSIS — R410 Disorientation, unspecified: Secondary | ICD-10-CM | POA: Diagnosis not present

## 2021-10-05 DIAGNOSIS — Z87891 Personal history of nicotine dependence: Secondary | ICD-10-CM | POA: Diagnosis not present

## 2021-10-05 DIAGNOSIS — R531 Weakness: Secondary | ICD-10-CM | POA: Diagnosis not present

## 2021-10-05 DIAGNOSIS — R4182 Altered mental status, unspecified: Secondary | ICD-10-CM | POA: Insufficient documentation

## 2021-10-05 DIAGNOSIS — Z7901 Long term (current) use of anticoagulants: Secondary | ICD-10-CM | POA: Diagnosis not present

## 2021-10-05 DIAGNOSIS — Z8551 Personal history of malignant neoplasm of bladder: Secondary | ICD-10-CM | POA: Insufficient documentation

## 2021-10-05 DIAGNOSIS — I4891 Unspecified atrial fibrillation: Secondary | ICD-10-CM | POA: Insufficient documentation

## 2021-10-05 DIAGNOSIS — I2510 Atherosclerotic heart disease of native coronary artery without angina pectoris: Secondary | ICD-10-CM | POA: Diagnosis not present

## 2021-10-05 DIAGNOSIS — R42 Dizziness and giddiness: Secondary | ICD-10-CM | POA: Diagnosis not present

## 2021-10-05 LAB — CBC WITH DIFFERENTIAL/PLATELET
Abs Immature Granulocytes: 0.01 10*3/uL (ref 0.00–0.07)
Basophils Absolute: 0 10*3/uL (ref 0.0–0.1)
Basophils Relative: 0 %
Eosinophils Absolute: 0.3 10*3/uL (ref 0.0–0.5)
Eosinophils Relative: 4 %
HCT: 41 % (ref 39.0–52.0)
Hemoglobin: 13.7 g/dL (ref 13.0–17.0)
Immature Granulocytes: 0 %
Lymphocytes Relative: 26 %
Lymphs Abs: 1.6 10*3/uL (ref 0.7–4.0)
MCH: 33.4 pg (ref 26.0–34.0)
MCHC: 33.4 g/dL (ref 30.0–36.0)
MCV: 100 fL (ref 80.0–100.0)
Monocytes Absolute: 0.7 10*3/uL (ref 0.1–1.0)
Monocytes Relative: 11 %
Neutro Abs: 3.5 10*3/uL (ref 1.7–7.7)
Neutrophils Relative %: 59 %
Platelets: 170 10*3/uL (ref 150–400)
RBC: 4.1 MIL/uL — ABNORMAL LOW (ref 4.22–5.81)
RDW: 12.8 % (ref 11.5–15.5)
WBC: 6.1 10*3/uL (ref 4.0–10.5)
nRBC: 0 % (ref 0.0–0.2)

## 2021-10-05 LAB — COMPREHENSIVE METABOLIC PANEL
ALT: 9 U/L (ref 0–44)
AST: 20 U/L (ref 15–41)
Albumin: 4.1 g/dL (ref 3.5–5.0)
Alkaline Phosphatase: 106 U/L (ref 38–126)
Anion gap: 10 (ref 5–15)
BUN: 20 mg/dL (ref 8–23)
CO2: 28 mmol/L (ref 22–32)
Calcium: 9.2 mg/dL (ref 8.9–10.3)
Chloride: 100 mmol/L (ref 98–111)
Creatinine, Ser: 1.21 mg/dL (ref 0.61–1.24)
GFR, Estimated: 57 mL/min — ABNORMAL LOW (ref >60)
Glucose, Bld: 111 mg/dL — ABNORMAL HIGH (ref 70–99)
Potassium: 3.8 mmol/L (ref 3.5–5.1)
Sodium: 138 mmol/L (ref 135–145)
Total Bilirubin: 0.6 mg/dL (ref 0.3–1.2)
Total Protein: 7.7 g/dL (ref 6.5–8.1)

## 2021-10-05 LAB — URINALYSIS, ROUTINE W REFLEX MICROSCOPIC
Bilirubin Urine: NEGATIVE
Glucose, UA: NEGATIVE mg/dL
Hgb urine dipstick: NEGATIVE
Ketones, ur: NEGATIVE mg/dL
Leukocytes,Ua: NEGATIVE
Nitrite: NEGATIVE
Protein, ur: NEGATIVE mg/dL
Specific Gravity, Urine: 1.01 (ref 1.005–1.030)
pH: 6 (ref 5.0–8.0)

## 2021-10-05 MED ORDER — LACTATED RINGERS IV BOLUS: 500.0000 mL | Freq: Once | INTRAVENOUS | Status: DC

## 2021-10-05 MED ORDER — LACTATED RINGERS IV BOLUS: 1000.0000 mL | Freq: Once | INTRAVENOUS | Status: DC

## 2021-10-05 NOTE — ED Provider Notes (Signed)
?[facility] ?Provider Note ? ?CSN: [id number] ?Arrival date & time: 10/05/21 1708 ? ?Chief Complaint(s) ?Altered Mental Status ? ?HPI ?[name] is a 88 y.o. male with PMH CAD, bladder cancer, dementia, HLD, A-fib who presents emergency department for evaluation of altered mental status.  History obtained from EMS who states that caretakers at the patient's facility states that the patient had a 20-minute episode of altered mental status and lethargy that is since resolved.  Patient symptoms resolved by the time that EMS arrived.  Patient states that he does not remember the surrounding events.  He denies chest pain, shortness of breath, abdominal pain, nausea, vomiting or other systemic symptoms while here in the emergency department. ? ? ?Altered Mental Status ?Presenting symptoms: confusion   ? ?Past Medical History ?Past Medical History:  ?Diagnosis Date  ? [name] deficiency 2011  ? Bladder cancer ([place])   ? Malignet Neoplasm  ? CAD (coronary artery disease)   ? Cerebral infarction [facility])   ? Dementia ([place])   ? Hx of colonic polyp   ? Hyperlipidemia   ? Memory loss 2011  ? Unspecified atrial fibrillation ([place])   ? ?Patient Active Problem List  ? Diagnosis Date Noted  ? Syncope 11/02/2020  ? Macrocytic anemia 11/02/2020  ? Left rib fracture 11/02/2020  ? Scalp hematoma 11/02/2020  ? Fall 11/01/2020  ? Hygroma 09/10/2018  ? 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 Chest pain 12/29/2013  ? Atrial fibrillation ([place]) 12/29/2013  ? VERTIGO 02/04/2010  ? [name] DEFICIENCY 01/31/2010  ? Memory loss 01/27/2010  ? PARESTHESIA 01/27/2010  ? Hyperlipidemia 11/02/2009  ? CORONARY ARTERY DISEASE 11/02/2009  ? SINUSITIS, ACUTE 11/02/2009  ? EPISTAXIS 11/02/2009  ? COLONIC POLYPS, HX OF 11/02/2009  ? ?Home Medication(s) ?Prior to Admission medications   ?Medication Sig Start Date End Date Taking? Authorizing Provider  ?acetaminophen (TYLENOL) 325 MG tablet Take 2 tablets  (650 mg total) by mouth every 6 (six) hours. 11/03/20   Murlean Iba, MD  ?atorvastatin (LIPITOR) 20 MG tablet Take 1 tablet (20 mg total) by mouth daily at 6 PM. 09/14/18   Rinehuls, Early Chars, PA-C  ?furosemide (LASIX) 20 MG tablet Take 20 mg by mouth every other day. 10/14/20   [provider]  ?Jamie Kato 17 GM/SCOOP powder Take by mouth. 05/12/21   [provider]  ?loratadine (CLARITIN) 10 MG tablet Take 10 mg by mouth daily.    [provider]  ?midodrine (PROAMATINE) 5 MG tablet Take 5 mg by mouth 2 (two) times daily with a meal.    [provider]  ?Polyethylene Glycol 3350 (MIRALAX PO) Take by mouth. Take as needed for constipation    [provider]  ?potassium chloride (KLOR-CON) 10 MEQ tablet Take 10 mEq by mouth every other day. 10/14/20   [provider]  ?Rivaroxaban (XARELTO) 15 MG TABS tablet Take 15 mg by mouth daily with supper.    [provider]  ?vitamin B-12 (CYANOCOBALAMIN) 1000 MCG tablet Take 1 tablet (1,000 mcg total) by mouth daily. 11/04/20   Murlean Iba, MD  ?                                                                                                                                  ?  Past Surgical History ?Past Surgical History:  ?Procedure Laterality Date  ? CHOLECYSTECTOMY    ? ?Family History ?Family History  ?Problem Relation Age of Onset  ? Coronary artery disease Other   ? Dementia Other   ? ? ?Social History ?Social History  ? ?Tobacco Use  ? Smoking status: Former  ?  Types: Cigarettes  ?  Quit date: 12/29/1973  ?  Years since quitting: 47.8  ? Smokeless tobacco: Never  ?Vaping Use  ? Vaping Use: Never used  ?Substance Use Topics  ? Alcohol use: No  ? Drug use: Never  ? ?Allergies ?Patient has no known allergies. ? ?Review of Systems ?Review of Systems  ?Psychiatric/Behavioral:  Positive for confusion.   ? ?Physical Exam ?Vital Signs  ?I have reviewed the triage vital signs ?There were no vitals taken  for this visit. ? ?Physical Exam ?Vitals and nursing note reviewed.  ?Constitutional:   ?   General: He is not in acute distress. ?   Appearance: He is well-developed.  ?HENT:  ?   Head: Normocephalic and atraumatic.  ?Eyes:  ?   Conjunctiva/sclera: Conjunctivae normal.  ?Cardiovascular:  ?   Rate and Rhythm: Normal rate and regular rhythm.  ?   Heart sounds: No murmur heard. ?Pulmonary:  ?   Effort: Pulmonary effort is normal. No respiratory distress.  ?   Breath sounds: Normal breath sounds.  ?Abdominal:  ?   Palpations: Abdomen is soft.  ?   Tenderness: There is no abdominal tenderness.  ?Musculoskeletal:     ?   General: No swelling.  ?   Cervical back: Neck supple.  ?Skin: ?   General: Skin is warm and dry.  ?   Capillary Refill: Capillary refill takes less than 2 seconds.  ?Neurological:  ?   Mental Status: He is alert.  ?Psychiatric:     ?   Mood and Affect: Mood normal.  ? ? ?ED Results and Treatments ?Labs ?(all labs ordered are listed, but only abnormal results are displayed) ?Labs Reviewed - No data to display                                                                                                                       ? ?Radiology ?No results found. ? ?Pertinent labs & imaging results that were available during my care of the patient were reviewed by me and considered in my medical decision making (see MDM for details). ? ?Medications Ordered in ED ?Medications - No data to display                                                               ?                                                                    ?  Procedures ?Procedures ? ?(including critical care time) ? ?Medical Decision Making / ED Course ? ? ?This patient presents to the ED for concern of altered mental status, this involves an extensive number of treatment options, and is a complaint that carries with it a high risk of complications and morbidity.  The differential diagnosis includes transient hypoglycemia, transient global  amnesia, delirium, electrolyte abnormality, ICH ? ?MDM: ?Patient seen emergency room for evaluation of an isolated episode of altered mental status.  On physical exam, patient has returned to normal mental status baseline and physical exam is otherwise unremarkable.  Laboratory evaluation unremarkable.  Patient with no cough, hypoxia or fever and thus chest x-ray not pursued.  Urinalysis negative for infection.  CT head unremarkable.  As patient has returned to normal mental status baseline, with negative imaging and lab work-up, patient safe for discharge back to his facility. ? ? ?Additional history obtained: ? ?-External records from outside source obtained and reviewed including: Chart review including previous notes, labs, imaging, consultation notes ? ? ?Lab Tests: ?-I ordered, reviewed, and interpreted labs.   ?The pertinent results include:   ?Labs Reviewed - No data to display  ? ? ? ?Imaging Studies ordered: ?I ordered imaging studies including CTH ?I independently visualized and interpreted imaging. ?I agree with the radiologist interpretation ? ? ?Medicines ordered and prescription drug management: ?No orders of the defined types were placed in this encounter. ?  ?-I have reviewed the patients home medicines and have made adjustments as needed ? ?Critical interventions ?none ? ?Cardiac Monitoring: ?The patient was maintained on a cardiac monitor.  I personally viewed and interpreted the cardiac monitored which showed an underlying rhythm of: NSR ? ?Social Determinants of Health:  ?Factors impacting patients care include: none ? ? ?Reevaluation: ?After the interventions noted above, I reevaluated the patient and found that they have :improved ? ?Co morbidities that complicate the patient evaluation ? ?Past Medical History:  ?Diagnosis Date  ? [name] deficiency 2011  ? Bladder cancer ([place])   ? Malignet Neoplasm  ? CAD (coronary artery disease)   ? Cerebral infarction [facility])   ? Dementia ([place])   ? Hx of colonic  polyp   ? Hyperlipidemia   ? Memory loss 2011  ? Unspecified atrial fibrillation ([place])   ?  ? ? ?Dispostion: ?I considered admission for this patient, but he has returned to normal mental status baseline a

## 2021-10-05 NOTE — ED Notes (Signed)
Patient standing up in room dressing himself. States he is ready to go home. Assisted back to stretcher awaiting transport back to nursing home.  ?

## 2021-10-05 NOTE — ED Triage Notes (Signed)
"  Was normal with no complaints or sickness and then suddenly became lethargic for approximately 20 minutes and then returned to normal" per EMS ?"I felt weak like I could pass out but I didn't, but I feel fine now and told them I didn't need to come to the hospital" per pt ?

## 2021-10-13 DIAGNOSIS — R4182 Altered mental status, unspecified: Secondary | ICD-10-CM | POA: Diagnosis not present

## 2021-10-13 DIAGNOSIS — G301 Alzheimer's disease with late onset: Secondary | ICD-10-CM | POA: Diagnosis not present

## 2021-10-13 DIAGNOSIS — I4821 Permanent atrial fibrillation: Secondary | ICD-10-CM | POA: Diagnosis not present

## 2021-10-31 DIAGNOSIS — R32 Unspecified urinary incontinence: Secondary | ICD-10-CM | POA: Diagnosis not present

## 2021-10-31 DIAGNOSIS — F03918 Unspecified dementia, unspecified severity, with other behavioral disturbance: Secondary | ICD-10-CM | POA: Diagnosis not present

## 2021-11-12 DIAGNOSIS — N182 Chronic kidney disease, stage 2 (mild): Secondary | ICD-10-CM | POA: Diagnosis not present

## 2021-11-12 DIAGNOSIS — I4821 Permanent atrial fibrillation: Secondary | ICD-10-CM | POA: Diagnosis not present

## 2021-11-22 DIAGNOSIS — Z0001 Encounter for general adult medical examination with abnormal findings: Secondary | ICD-10-CM | POA: Diagnosis not present

## 2021-11-22 DIAGNOSIS — N390 Urinary tract infection, site not specified: Secondary | ICD-10-CM | POA: Diagnosis not present

## 2021-11-22 DIAGNOSIS — I10 Essential (primary) hypertension: Secondary | ICD-10-CM | POA: Diagnosis not present

## 2021-11-29 DIAGNOSIS — F03918 Unspecified dementia, unspecified severity, with other behavioral disturbance: Secondary | ICD-10-CM | POA: Diagnosis not present

## 2021-11-29 DIAGNOSIS — R32 Unspecified urinary incontinence: Secondary | ICD-10-CM | POA: Diagnosis not present

## 2021-12-13 DIAGNOSIS — I4821 Permanent atrial fibrillation: Secondary | ICD-10-CM | POA: Diagnosis not present

## 2021-12-13 DIAGNOSIS — G301 Alzheimer's disease with late onset: Secondary | ICD-10-CM | POA: Diagnosis not present

## 2021-12-29 DIAGNOSIS — F03918 Unspecified dementia, unspecified severity, with other behavioral disturbance: Secondary | ICD-10-CM | POA: Diagnosis not present

## 2021-12-29 DIAGNOSIS — R32 Unspecified urinary incontinence: Secondary | ICD-10-CM | POA: Diagnosis not present

## 2022-01-12 DIAGNOSIS — N182 Chronic kidney disease, stage 2 (mild): Secondary | ICD-10-CM | POA: Diagnosis not present

## 2022-01-12 DIAGNOSIS — I4821 Permanent atrial fibrillation: Secondary | ICD-10-CM | POA: Diagnosis not present

## 2022-01-30 DIAGNOSIS — R32 Unspecified urinary incontinence: Secondary | ICD-10-CM | POA: Diagnosis not present

## 2022-01-30 DIAGNOSIS — F03918 Unspecified dementia, unspecified severity, with other behavioral disturbance: Secondary | ICD-10-CM | POA: Diagnosis not present

## 2022-02-12 DIAGNOSIS — I5022 Chronic systolic (congestive) heart failure: Secondary | ICD-10-CM | POA: Diagnosis not present

## 2022-02-12 DIAGNOSIS — E785 Hyperlipidemia, unspecified: Secondary | ICD-10-CM | POA: Diagnosis not present

## 2022-02-16 DIAGNOSIS — N390 Urinary tract infection, site not specified: Secondary | ICD-10-CM | POA: Diagnosis not present

## 2022-02-21 DIAGNOSIS — L700 Acne vulgaris: Secondary | ICD-10-CM | POA: Diagnosis not present

## 2022-02-21 DIAGNOSIS — I5022 Chronic systolic (congestive) heart failure: Secondary | ICD-10-CM | POA: Diagnosis not present

## 2022-02-21 DIAGNOSIS — I480 Paroxysmal atrial fibrillation: Secondary | ICD-10-CM | POA: Diagnosis not present

## 2022-02-21 DIAGNOSIS — Z7901 Long term (current) use of anticoagulants: Secondary | ICD-10-CM | POA: Diagnosis not present

## 2022-02-28 DIAGNOSIS — F03918 Unspecified dementia, unspecified severity, with other behavioral disturbance: Secondary | ICD-10-CM | POA: Diagnosis not present

## 2022-02-28 DIAGNOSIS — R32 Unspecified urinary incontinence: Secondary | ICD-10-CM | POA: Diagnosis not present

## 2022-03-08 DIAGNOSIS — N390 Urinary tract infection, site not specified: Secondary | ICD-10-CM | POA: Diagnosis not present

## 2022-03-09 DIAGNOSIS — Z0100 Encounter for examination of eyes and vision without abnormal findings: Secondary | ICD-10-CM | POA: Diagnosis not present

## 2022-03-09 DIAGNOSIS — H524 Presbyopia: Secondary | ICD-10-CM | POA: Diagnosis not present

## 2022-03-16 DIAGNOSIS — I480 Paroxysmal atrial fibrillation: Secondary | ICD-10-CM | POA: Diagnosis not present

## 2022-03-16 DIAGNOSIS — G301 Alzheimer's disease with late onset: Secondary | ICD-10-CM | POA: Diagnosis not present

## 2022-03-16 DIAGNOSIS — N182 Chronic kidney disease, stage 2 (mild): Secondary | ICD-10-CM | POA: Diagnosis not present

## 2022-03-16 DIAGNOSIS — E785 Hyperlipidemia, unspecified: Secondary | ICD-10-CM | POA: Diagnosis not present

## 2022-04-03 DIAGNOSIS — R32 Unspecified urinary incontinence: Secondary | ICD-10-CM | POA: Diagnosis not present

## 2022-04-03 DIAGNOSIS — F03918 Unspecified dementia, unspecified severity, with other behavioral disturbance: Secondary | ICD-10-CM | POA: Diagnosis not present

## 2022-04-15 DIAGNOSIS — R2689 Other abnormalities of gait and mobility: Secondary | ICD-10-CM | POA: Diagnosis not present

## 2022-04-15 DIAGNOSIS — I5022 Chronic systolic (congestive) heart failure: Secondary | ICD-10-CM | POA: Diagnosis not present

## 2022-04-29 IMAGING — DX DG RIBS W/ CHEST 3+V*L*
4 series · 5 of 5 positions shown · non-contrast
Comparison: None.

CLINICAL DATA: Fall today while dancing, left shoulder and rib
pain.

EXAM:
LEFT RIBS AND CHEST - 3+ VIEW

[Series 5: chest ap · 0.13mm/px · 2 of 2 slices shown]
[im 1/2]
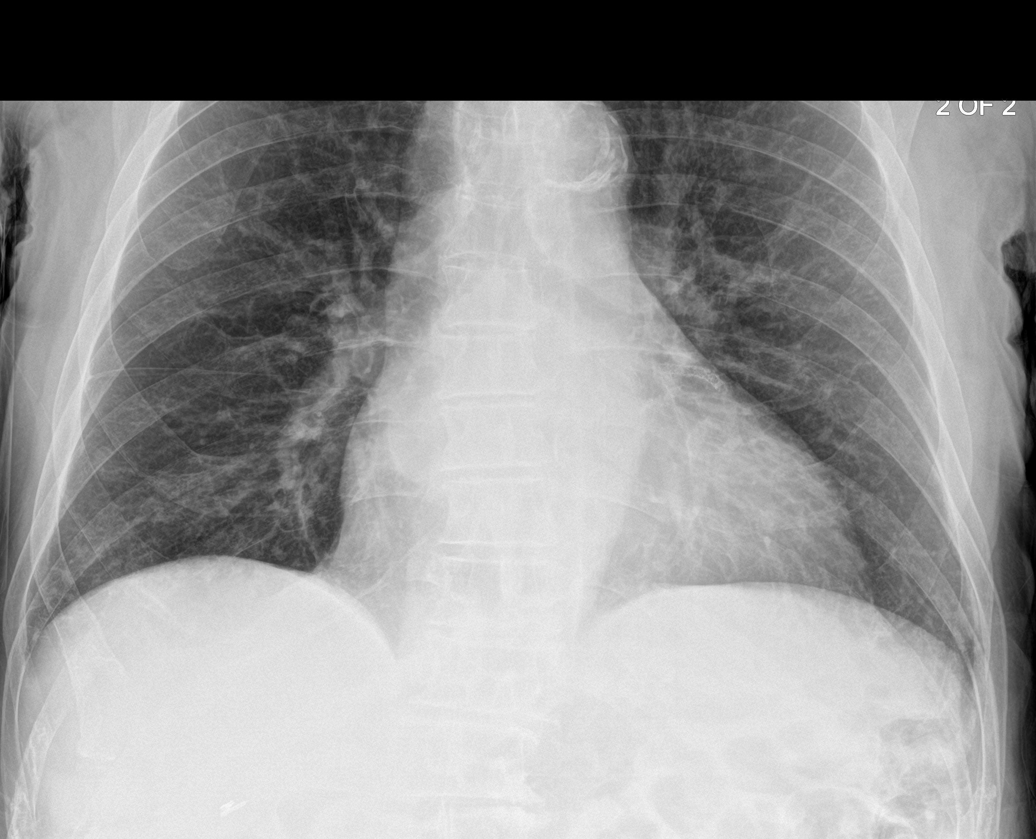
[im 2/2]
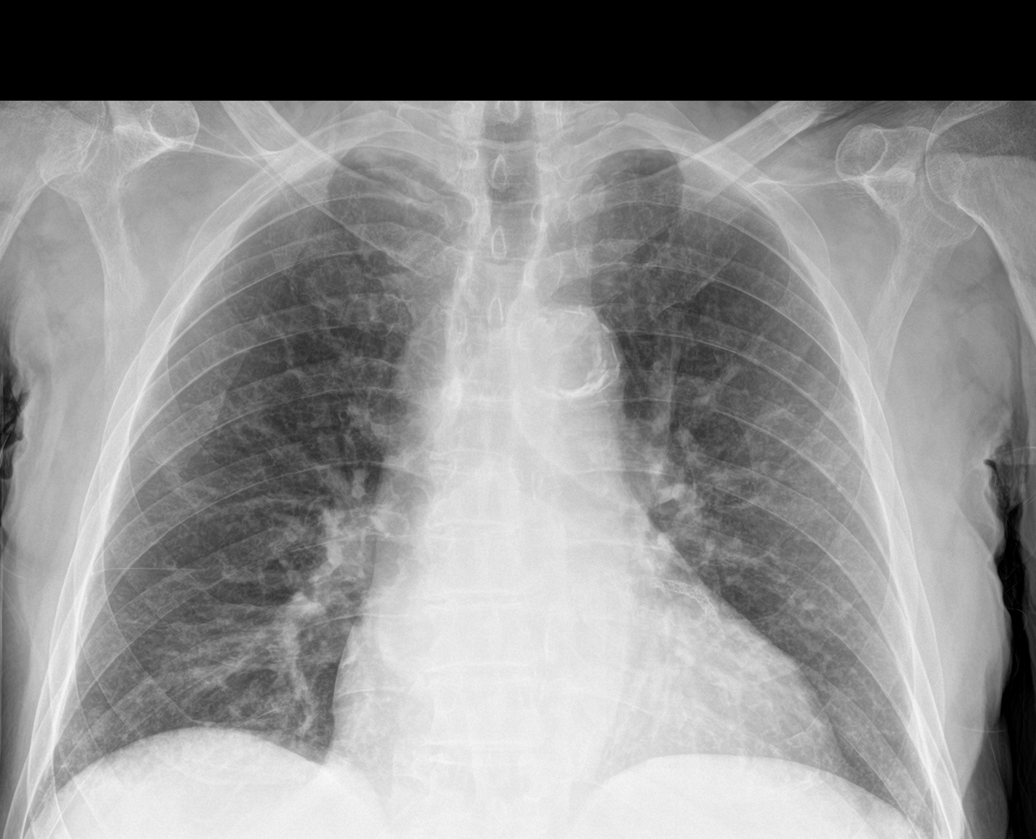

[rib ap (1 of 2)]
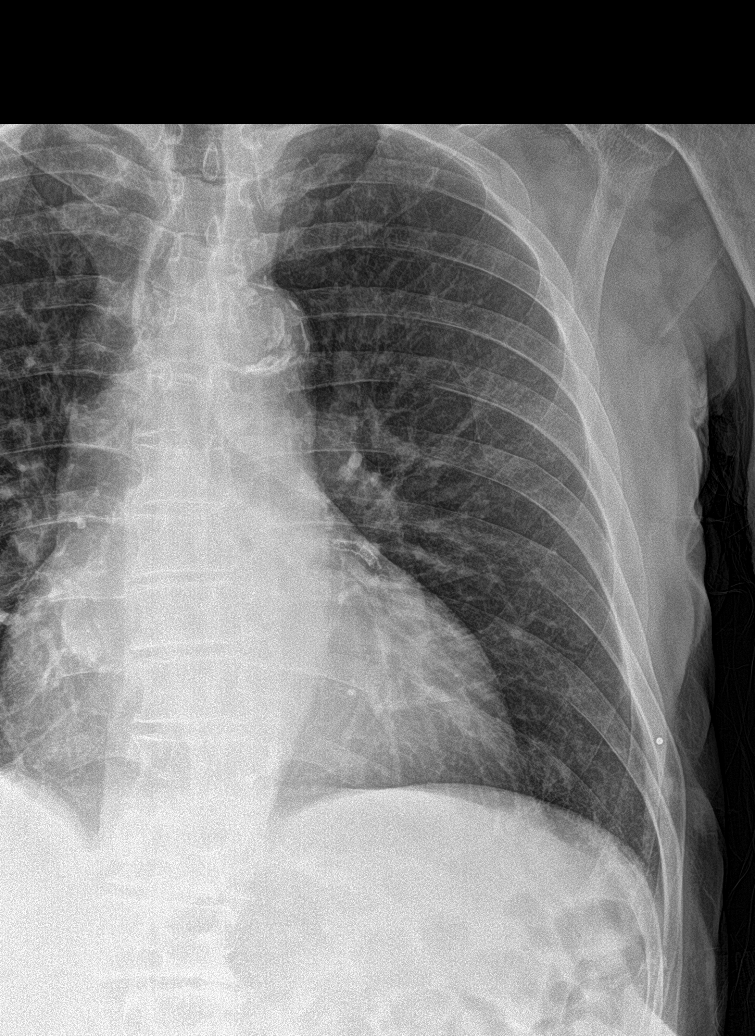

[rib pa obl]
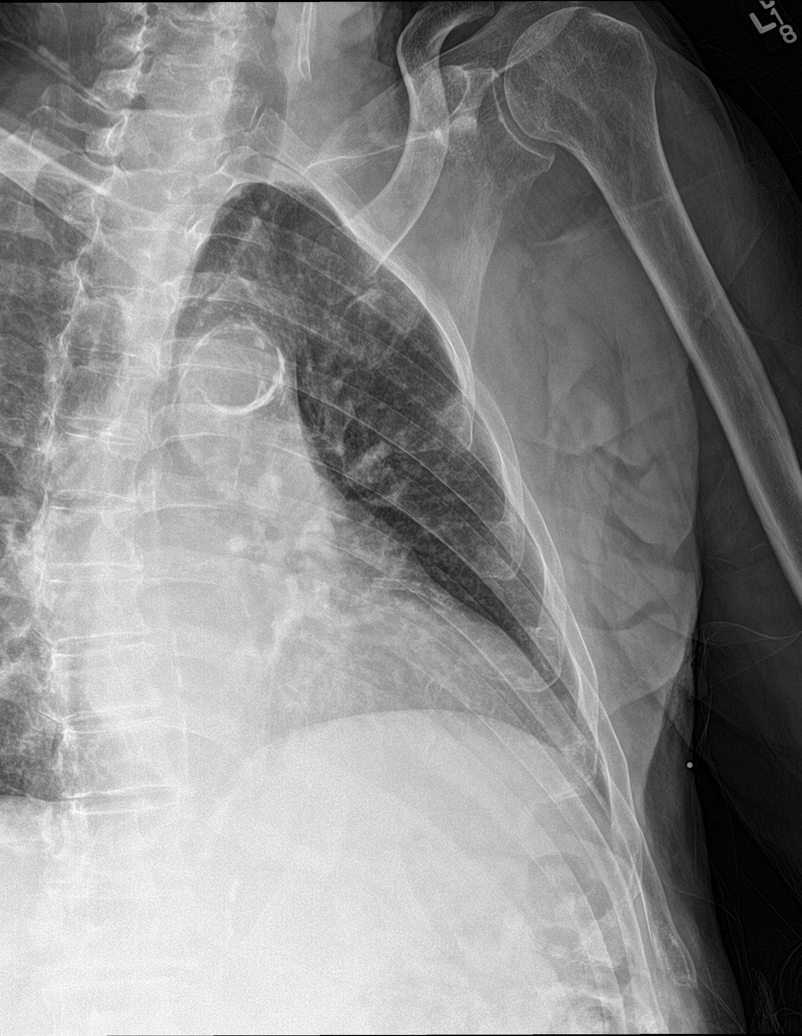

[rib ap (2 of 2)]
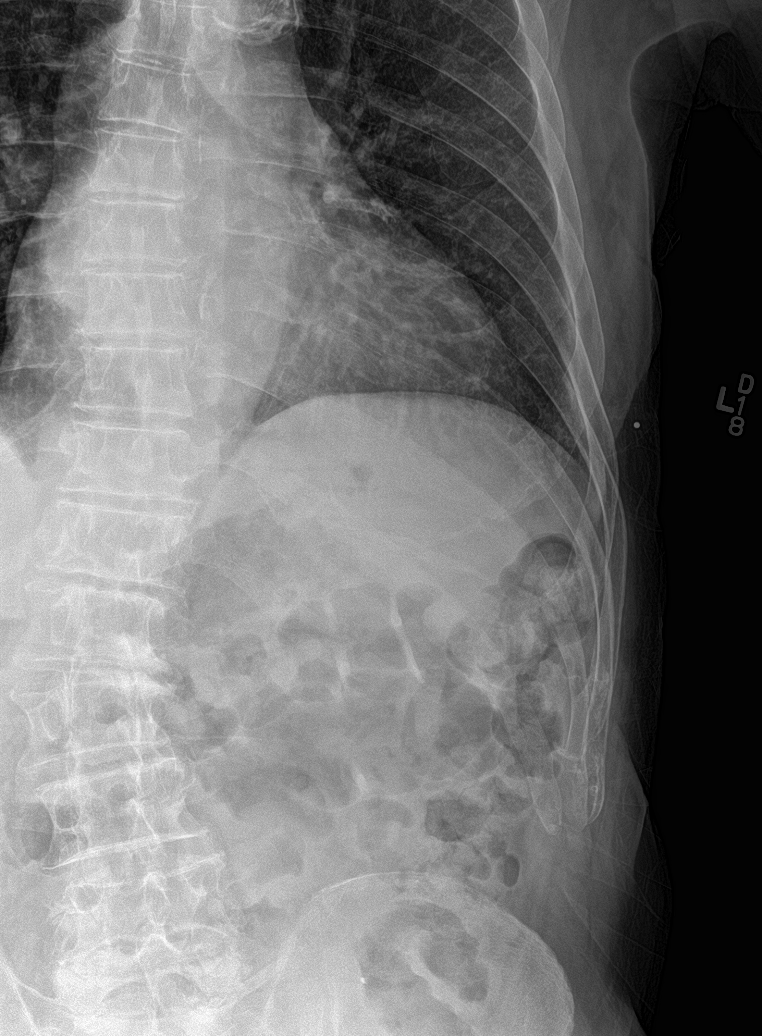

[5 of 5 positions shown; findings below may reference images not displayed]

FINDINGS: Nondisplaced anterolateral seventh rib fracture. There is no
evidence of pneumothorax or pleural effusion. Both lungs are clear.
Upper normal heart size. Aortic atherosclerosis. Mediastinal
contours are within normal limits.
IMPRESSION: Nondisplaced anterolateral seventh rib fracture. No acute pulmonary
complication.

## 2022-04-29 IMAGING — DX DG HIP (WITH OR WITHOUT PELVIS) 2-3V*L*
3 series · 3 of 3 positions shown · non-contrast
Comparison: None.

CLINICAL DATA: Fall today while dancing.

EXAM:
DG HIP (WITH OR WITHOUT PELVIS) 2-3V LEFT

[pelvis ap]
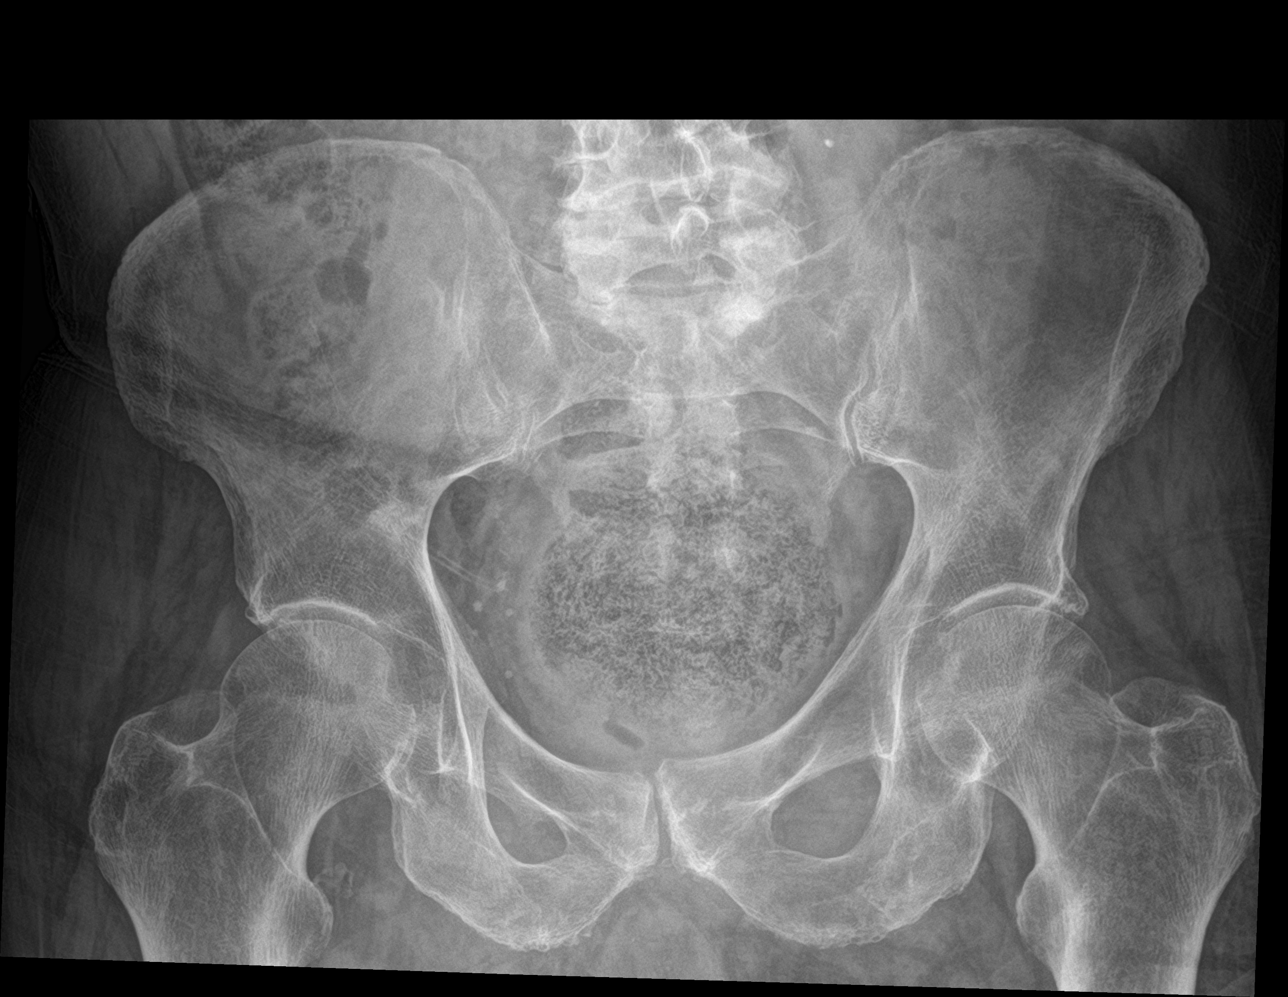

[hip ap]
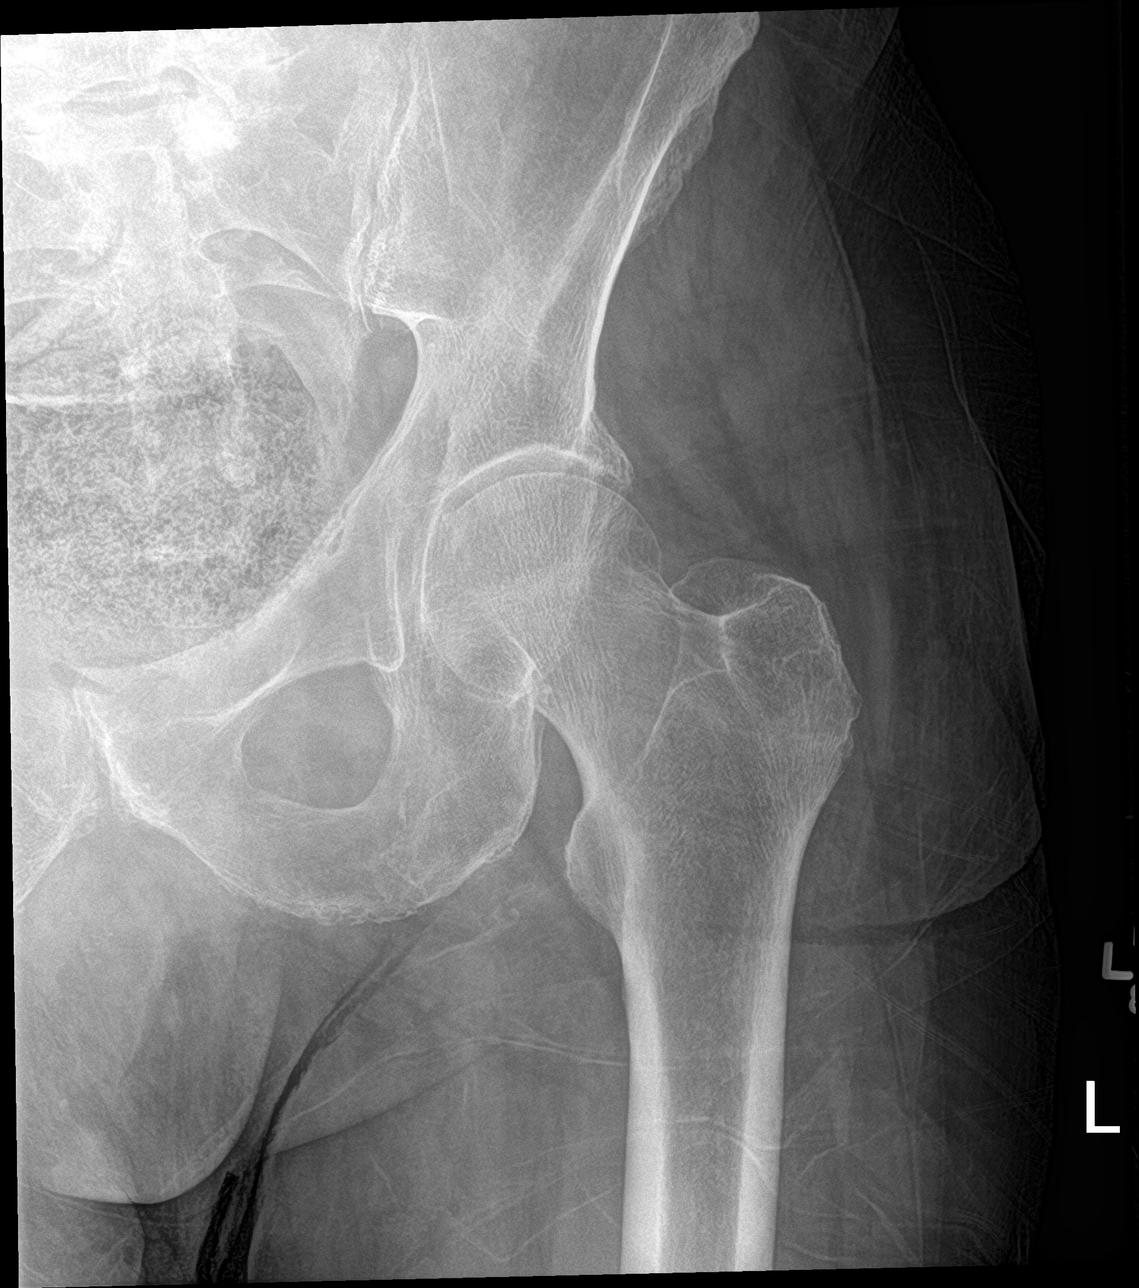

[hip frog leg]
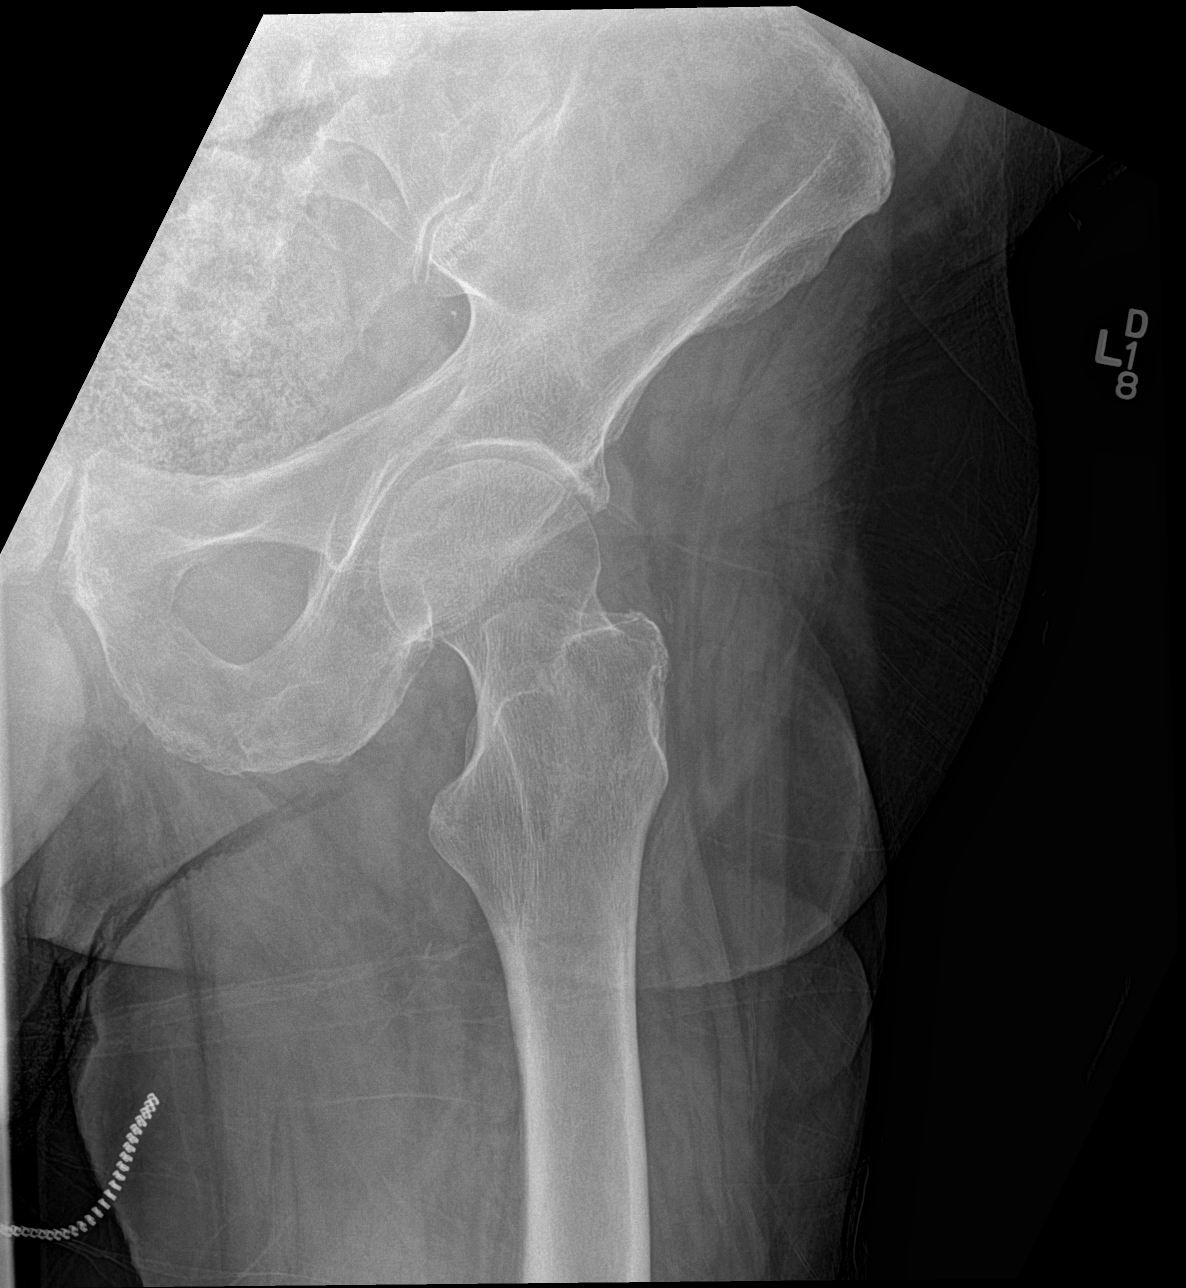

[3 of 3 positions shown; findings below may reference images not displayed]

FINDINGS: The cortical margins of the bony pelvis and left hip are intact. No
fracture. Pubic symphysis and sacroiliac joints are congruent. Pubic
rami are intact. Minor left hip degenerative change with acetabular
spurring.
IMPRESSION: Minor left hip osteoarthritis without acute fracture.

## 2022-05-03 DIAGNOSIS — R32 Unspecified urinary incontinence: Secondary | ICD-10-CM | POA: Diagnosis not present

## 2022-05-03 DIAGNOSIS — F03918 Unspecified dementia, unspecified severity, with other behavioral disturbance: Secondary | ICD-10-CM | POA: Diagnosis not present

## 2022-05-16 DIAGNOSIS — I5022 Chronic systolic (congestive) heart failure: Secondary | ICD-10-CM | POA: Diagnosis not present

## 2022-05-16 DIAGNOSIS — E785 Hyperlipidemia, unspecified: Secondary | ICD-10-CM | POA: Diagnosis not present

## 2022-05-30 DIAGNOSIS — F03918 Unspecified dementia, unspecified severity, with other behavioral disturbance: Secondary | ICD-10-CM | POA: Diagnosis not present

## 2022-05-30 DIAGNOSIS — R32 Unspecified urinary incontinence: Secondary | ICD-10-CM | POA: Diagnosis not present

## 2022-06-15 DIAGNOSIS — I5022 Chronic systolic (congestive) heart failure: Secondary | ICD-10-CM | POA: Diagnosis not present

## 2022-06-15 DIAGNOSIS — R2689 Other abnormalities of gait and mobility: Secondary | ICD-10-CM | POA: Diagnosis not present

## 2022-06-29 DIAGNOSIS — R32 Unspecified urinary incontinence: Secondary | ICD-10-CM | POA: Diagnosis not present

## 2022-06-29 DIAGNOSIS — F03918 Unspecified dementia, unspecified severity, with other behavioral disturbance: Secondary | ICD-10-CM | POA: Diagnosis not present

## 2022-07-16 DIAGNOSIS — I5022 Chronic systolic (congestive) heart failure: Secondary | ICD-10-CM | POA: Diagnosis not present

## 2022-07-16 DIAGNOSIS — N182 Chronic kidney disease, stage 2 (mild): Secondary | ICD-10-CM | POA: Diagnosis not present

## 2022-07-31 DIAGNOSIS — R32 Unspecified urinary incontinence: Secondary | ICD-10-CM | POA: Diagnosis not present

## 2022-07-31 DIAGNOSIS — F03918 Unspecified dementia, unspecified severity, with other behavioral disturbance: Secondary | ICD-10-CM | POA: Diagnosis not present

## 2022-08-07 DIAGNOSIS — H524 Presbyopia: Secondary | ICD-10-CM | POA: Diagnosis not present

## 2022-08-07 DIAGNOSIS — Z0100 Encounter for examination of eyes and vision without abnormal findings: Secondary | ICD-10-CM | POA: Diagnosis not present

## 2022-08-16 DIAGNOSIS — N182 Chronic kidney disease, stage 2 (mild): Secondary | ICD-10-CM | POA: Diagnosis not present

## 2022-08-16 DIAGNOSIS — I5022 Chronic systolic (congestive) heart failure: Secondary | ICD-10-CM | POA: Diagnosis not present

## 2022-08-30 DIAGNOSIS — F03918 Unspecified dementia, unspecified severity, with other behavioral disturbance: Secondary | ICD-10-CM | POA: Diagnosis not present

## 2022-08-30 DIAGNOSIS — R32 Unspecified urinary incontinence: Secondary | ICD-10-CM | POA: Diagnosis not present

## 2022-09-25 DIAGNOSIS — G301 Alzheimer's disease with late onset: Secondary | ICD-10-CM | POA: Diagnosis not present

## 2022-09-25 DIAGNOSIS — Z0001 Encounter for general adult medical examination with abnormal findings: Secondary | ICD-10-CM | POA: Diagnosis not present

## 2022-09-25 DIAGNOSIS — I480 Paroxysmal atrial fibrillation: Secondary | ICD-10-CM | POA: Diagnosis not present

## 2022-09-25 DIAGNOSIS — N182 Chronic kidney disease, stage 2 (mild): Secondary | ICD-10-CM | POA: Diagnosis not present

## 2022-09-25 DIAGNOSIS — I5022 Chronic systolic (congestive) heart failure: Secondary | ICD-10-CM | POA: Diagnosis not present

## 2022-09-25 DIAGNOSIS — Z7901 Long term (current) use of anticoagulants: Secondary | ICD-10-CM | POA: Diagnosis not present

## 2022-09-25 DIAGNOSIS — Z1331 Encounter for screening for depression: Secondary | ICD-10-CM | POA: Diagnosis not present

## 2022-09-25 DIAGNOSIS — R2689 Other abnormalities of gait and mobility: Secondary | ICD-10-CM | POA: Diagnosis not present

## 2022-09-25 DIAGNOSIS — Z1389 Encounter for screening for other disorder: Secondary | ICD-10-CM | POA: Diagnosis not present

## 2022-09-28 DIAGNOSIS — F03918 Unspecified dementia, unspecified severity, with other behavioral disturbance: Secondary | ICD-10-CM | POA: Diagnosis not present

## 2022-09-28 DIAGNOSIS — R32 Unspecified urinary incontinence: Secondary | ICD-10-CM | POA: Diagnosis not present

## 2022-10-03 DIAGNOSIS — Z0001 Encounter for general adult medical examination with abnormal findings: Secondary | ICD-10-CM | POA: Diagnosis not present

## 2022-10-03 DIAGNOSIS — E785 Hyperlipidemia, unspecified: Secondary | ICD-10-CM | POA: Diagnosis not present

## 2022-10-03 DIAGNOSIS — N182 Chronic kidney disease, stage 2 (mild): Secondary | ICD-10-CM | POA: Diagnosis not present

## 2022-10-26 DIAGNOSIS — N182 Chronic kidney disease, stage 2 (mild): Secondary | ICD-10-CM | POA: Diagnosis not present

## 2022-10-26 DIAGNOSIS — I5022 Chronic systolic (congestive) heart failure: Secondary | ICD-10-CM | POA: Diagnosis not present

## 2022-11-05 ENCOUNTER — Encounter (HOSPITAL_COMMUNITY): Payer: Self-pay

## 2022-11-05 ENCOUNTER — Emergency Department (HOSPITAL_COMMUNITY): Payer: Medicare HMO

## 2022-11-05 ENCOUNTER — Other Ambulatory Visit: Payer: Self-pay

## 2022-11-05 ENCOUNTER — Emergency Department (HOSPITAL_COMMUNITY)
Admission: EM | Admit: 2022-11-05 | Discharge: 2022-11-06 | Disposition: A | Discharge status: Skilled Nursing Facility | Payer: Medicare HMO | Attending: Emergency Medicine | Admitting: Emergency Medicine

## 2022-11-05 DIAGNOSIS — Z7901 Long term (current) use of anticoagulants: Secondary | ICD-10-CM | POA: Insufficient documentation

## 2022-11-05 DIAGNOSIS — S39012A Strain of muscle, fascia and tendon of lower back, initial encounter: Secondary | ICD-10-CM | POA: Diagnosis not present

## 2022-11-05 DIAGNOSIS — M549 Dorsalgia, unspecified: Secondary | ICD-10-CM | POA: Diagnosis not present

## 2022-11-05 DIAGNOSIS — M5136 Other intervertebral disc degeneration, lumbar region: Secondary | ICD-10-CM | POA: Diagnosis not present

## 2022-11-05 DIAGNOSIS — X58XXXA Exposure to other specified factors, initial encounter: Secondary | ICD-10-CM | POA: Insufficient documentation

## 2022-11-05 DIAGNOSIS — M25552 Pain in left hip: Secondary | ICD-10-CM | POA: Insufficient documentation

## 2022-11-05 DIAGNOSIS — F0390 Unspecified dementia without behavioral disturbance: Secondary | ICD-10-CM | POA: Diagnosis not present

## 2022-11-05 DIAGNOSIS — M25551 Pain in right hip: Secondary | ICD-10-CM | POA: Diagnosis not present

## 2022-11-05 DIAGNOSIS — M545 Low back pain, unspecified: Secondary | ICD-10-CM | POA: Diagnosis not present

## 2022-11-05 NOTE — Discharge Instructions (Signed)
Take Tylenol for pain and follow-up with your doctor if any problems °

## 2022-11-05 NOTE — ED Triage Notes (Signed)
[facility]ms from [facility]. Cc of lower back pain and hip pain. Denies any falls . Hx of dementia

## 2022-11-05 NOTE — ED Provider Notes (Signed)
[place] EMERGENCY DEPARTMENT AT [facility] Provider Note   CSN: [id number] Arrival date & time: 11/05/22  2041     History {Add pertinent medical, surgical, social history, OB history to HPI:1} Chief Complaint  Patient presents with   Back Pain    [name] is a 89 y.o. male.  Patient with low back pain and bilateral hip pain   Back Pain      Home Medications Prior to Admission medications   Medication Sig Start Date End Date Taking? Authorizing Provider  acetaminophen (TYLENOL) 325 MG tablet Take 2 tablets (650 mg total) by mouth every 6 (six) hours. 11/03/20   Johnson, Clanford L, MD  atorvastatin (LIPITOR) 20 MG tablet Take 1 tablet (20 mg total) by mouth daily at 6 PM. 09/14/18   Rinehuls, Kinnie Scales, PA-C  furosemide (LASIX) 20 MG tablet Take 20 mg by mouth every other day. 10/14/20   [provider]  [name] 17 GM/SCOOP powder Take by mouth. 05/12/21   [provider]  loratadine (CLARITIN) 10 MG tablet Take 10 mg by mouth daily.    [provider]  midodrine (PROAMATINE) 5 MG tablet Take 5 mg by mouth 2 (two) times daily with a meal.    [provider]  Rivaroxaban (XARELTO) 15 MG TABS tablet Take 15 mg by mouth daily with supper.    [provider]  vitamin B-12 (CYANOCOBALAMIN) 1000 MCG tablet Take 1 tablet (1,000 mcg total) by mouth daily. 11/04/20   [name], MD      Allergies    Patient has no known allergies.    Review of Systems   Review of Systems  Musculoskeletal:  Positive for back pain.    Physical Exam Updated Vital Signs BP 129/62   Pulse 63   Temp (!) 97.5 F (36.4 C) (Oral)   Resp 18   Ht 5\' 8"  (1.727 m)   Wt 76 kg   SpO2 97%   BMI 25.48 kg/m  Physical Exam  ED Results / Procedures / Treatments   Labs (all labs ordered are listed, but only abnormal results are displayed) Labs Reviewed - No data to display  EKG None  Radiology DG Lumbar Spine  Complete  Result Date: 11/05/2022 CLINICAL DATA:  Pain EXAM: LUMBAR SPINE - COMPLETE 4+ VIEW COMPARISON:  None Available. FINDINGS: Bones are osteopenic. Alignment is anatomic. There is mild dextroconvex curvature of the mid lumbar spine. No acute fractures are seen. There is moderate disc space narrowing at all lumbar levels sparing L5-S1. Endplate osteophytes are seen throughout. There are degenerative changes of facet joints at L4-L5 and L5-S1. There surgical clips in the right upper quadrant. There is large stool burden in the rectum. There are atherosclerotic calcifications of the aorta. IMPRESSION: 1. Moderate multilevel degenerative changes of the lumbar spine. 2. Large stool burden in the rectum. Electronically Signed   By: Darliss Cheney M.D.   On: 11/05/2022 22:41   DG Hip Unilat W or Wo Pelvis 2-3 Views Right  Result Date: 11/05/2022 CLINICAL DATA:  Recent fall with right hip pain, initial encounter EXAM: DG HIP (WITH OR WITHOUT PELVIS) 3V RIGHT COMPARISON:  None Available. FINDINGS: There is no evidence of hip fracture or dislocation. There is no evidence of arthropathy or other focal bone abnormality. IMPRESSION: No acute abnormality noted. Electronically Signed   By: Alcide Clever M.D.   On: 11/05/2022 22:40   DG Hip Unilat W or Wo Pelvis 2-3 Views Left  Result Date: 11/05/2022 CLINICAL DATA:  Recent fall with hip pain, initial encounter EXAM: DG HIP (WITH OR WITHOUT PELVIS) 3V LEFT COMPARISON:  None Available. FINDINGS: Pelvic ring is intact. No acute fracture or dislocation is noted. No soft tissue abnormality is seen. IMPRESSION: No acute abnormality noted. Electronically Signed   By: Alcide Clever M.D.   On: 11/05/2022 22:39    Procedures Procedures  {Document cardiac monitor, telemetry assessment procedure when appropriate:1}  Medications Ordered in ED Medications - No data to display  ED Course/ Medical Decision Making/ A&P   {   Click here for ABCD2, HEART and other  calculatorsREFRESH Note before signing :1}                          Medical Decision Making Amount and/or Complexity of Data Reviewed Radiology: ordered.   Patient with degenerative changes in the lumbar spine.  He is given Tylenol and follow-up as needed  {Document critical care time when appropriate:1} {Document review of labs and clinical decision tools ie heart score, Chads2Vasc2 etc:1}  {Document your independent review of radiology images, and any outside records:1} {Document your discussion with family members, caretakers, and with consultants:1} {Document social determinants of health affecting pt's care:1} {Document your decision making why or why not admission, treatments were needed:1} Final Clinical Impression(s) / ED Diagnoses Final diagnoses:  Strain of lumbar region, initial encounter    Rx / DC Orders ED Discharge Orders     None

## 2022-11-05 NOTE — ED Notes (Signed)
Pt at foot of bed. Physically aggressive when attempting to get back in the bed.

## 2022-11-05 NOTE — ED Notes (Signed)
Patient transported to X-ray 

## 2022-11-05 NOTE — ED Notes (Signed)
Pt ambulating in room back to bed.

## 2022-11-05 NOTE — ED Notes (Signed)
Gave ice cream

## 2022-11-05 NOTE — ED Notes (Signed)
Called facility and gave report

## 2022-11-05 NOTE — ED Notes (Signed)
Back from X/R.

## 2022-11-06 DIAGNOSIS — R410 Disorientation, unspecified: Secondary | ICD-10-CM | POA: Diagnosis not present

## 2022-11-06 DIAGNOSIS — Z743 Need for continuous supervision: Secondary | ICD-10-CM | POA: Diagnosis not present

## 2022-11-08 DIAGNOSIS — M545 Low back pain, unspecified: Secondary | ICD-10-CM | POA: Diagnosis not present

## 2022-11-08 DIAGNOSIS — M159 Polyosteoarthritis, unspecified: Secondary | ICD-10-CM | POA: Diagnosis not present

## 2022-11-16 ENCOUNTER — Telehealth: Payer: Self-pay

## 2022-11-16 NOTE — Telephone Encounter (Signed)
Entered in error

## 2022-12-09 DIAGNOSIS — N182 Chronic kidney disease, stage 2 (mild): Secondary | ICD-10-CM | POA: Diagnosis not present

## 2022-12-09 DIAGNOSIS — I5022 Chronic systolic (congestive) heart failure: Secondary | ICD-10-CM | POA: Diagnosis not present

## 2023-01-08 DIAGNOSIS — I5022 Chronic systolic (congestive) heart failure: Secondary | ICD-10-CM | POA: Diagnosis not present

## 2023-01-08 DIAGNOSIS — N182 Chronic kidney disease, stage 2 (mild): Secondary | ICD-10-CM | POA: Diagnosis not present

## 2023-01-15 DIAGNOSIS — M6281 Muscle weakness (generalized): Secondary | ICD-10-CM | POA: Diagnosis not present

## 2023-01-18 DIAGNOSIS — M6281 Muscle weakness (generalized): Secondary | ICD-10-CM | POA: Diagnosis not present

## 2023-01-22 DIAGNOSIS — M6281 Muscle weakness (generalized): Secondary | ICD-10-CM | POA: Diagnosis not present

## 2023-01-23 DIAGNOSIS — M6281 Muscle weakness (generalized): Secondary | ICD-10-CM | POA: Diagnosis not present

## 2023-01-29 DIAGNOSIS — H353131 Nonexudative age-related macular degeneration, bilateral, early dry stage: Secondary | ICD-10-CM | POA: Diagnosis not present

## 2023-01-29 DIAGNOSIS — M6281 Muscle weakness (generalized): Secondary | ICD-10-CM | POA: Diagnosis not present

## 2023-01-30 DIAGNOSIS — Z0100 Encounter for examination of eyes and vision without abnormal findings: Secondary | ICD-10-CM | POA: Diagnosis not present

## 2023-01-31 DIAGNOSIS — M6281 Muscle weakness (generalized): Secondary | ICD-10-CM | POA: Diagnosis not present

## 2023-02-04 ENCOUNTER — Emergency Department (HOSPITAL_COMMUNITY): Payer: Medicare HMO

## 2023-02-04 ENCOUNTER — Encounter (HOSPITAL_COMMUNITY): Payer: Self-pay

## 2023-02-04 ENCOUNTER — Emergency Department (HOSPITAL_COMMUNITY)
Admission: EM | Admit: 2023-02-04 | Discharge: 2023-02-04 | Disposition: A | Discharge status: Home / Self Care | Payer: Medicare HMO | Attending: Emergency Medicine | Admitting: Emergency Medicine

## 2023-02-04 ENCOUNTER — Other Ambulatory Visit: Payer: Self-pay

## 2023-02-04 DIAGNOSIS — I10 Essential (primary) hypertension: Secondary | ICD-10-CM | POA: Diagnosis not present

## 2023-02-04 DIAGNOSIS — I959 Hypotension, unspecified: Secondary | ICD-10-CM | POA: Diagnosis not present

## 2023-02-04 DIAGNOSIS — S0990XA Unspecified injury of head, initial encounter: Secondary | ICD-10-CM | POA: Diagnosis not present

## 2023-02-04 DIAGNOSIS — Z7901 Long term (current) use of anticoagulants: Secondary | ICD-10-CM | POA: Insufficient documentation

## 2023-02-04 DIAGNOSIS — W19XXXA Unspecified fall, initial encounter: Secondary | ICD-10-CM | POA: Diagnosis not present

## 2023-02-04 DIAGNOSIS — M79605 Pain in left leg: Secondary | ICD-10-CM | POA: Diagnosis not present

## 2023-02-04 DIAGNOSIS — F0390 Unspecified dementia without behavioral disturbance: Secondary | ICD-10-CM | POA: Insufficient documentation

## 2023-02-04 DIAGNOSIS — W1839XA Other fall on same level, initial encounter: Secondary | ICD-10-CM | POA: Diagnosis not present

## 2023-02-04 DIAGNOSIS — I700 Atherosclerosis of aorta: Secondary | ICD-10-CM | POA: Diagnosis not present

## 2023-02-04 DIAGNOSIS — Z955 Presence of coronary angioplasty implant and graft: Secondary | ICD-10-CM | POA: Diagnosis not present

## 2023-02-04 DIAGNOSIS — S0083XA Contusion of other part of head, initial encounter: Secondary | ICD-10-CM | POA: Diagnosis not present

## 2023-02-04 DIAGNOSIS — R0689 Other abnormalities of breathing: Secondary | ICD-10-CM | POA: Diagnosis not present

## 2023-02-04 DIAGNOSIS — J3489 Other specified disorders of nose and nasal sinuses: Secondary | ICD-10-CM | POA: Diagnosis not present

## 2023-02-04 DIAGNOSIS — M549 Dorsalgia, unspecified: Secondary | ICD-10-CM | POA: Diagnosis not present

## 2023-02-04 DIAGNOSIS — S065X0A Traumatic subdural hemorrhage without loss of consciousness, initial encounter: Secondary | ICD-10-CM | POA: Diagnosis not present

## 2023-02-04 DIAGNOSIS — I6782 Cerebral ischemia: Secondary | ICD-10-CM | POA: Diagnosis not present

## 2023-02-04 DIAGNOSIS — R4182 Altered mental status, unspecified: Secondary | ICD-10-CM | POA: Diagnosis not present

## 2023-02-04 MED ORDER — LORAZEPAM 2 MG/ML IJ SOLN
1.0000 mg | Freq: Once | INTRAMUSCULAR | Status: AC
  Administered 2023-02-04: 1 mg via INTRAMUSCULAR
  Filled 2023-02-04: qty 1

## 2023-02-04 NOTE — ED Notes (Signed)
patient was asleep. Stretcher in view of nursing desk. Sitter informed me that she was going to go to the bathroom and he was asleep. I looked up to be sure I could see him and his head was laid on side and he was still.

## 2023-02-04 NOTE — ED Notes (Signed)
Pt is up ambulating being aggressive and mad because he is wanting to leave. Pt verbalized he has not fallen and he wants to leave. Redirection attempted with pt.

## 2023-02-04 NOTE — ED Notes (Signed)
Lyla Son legal guardian informed of patient's fall and director of [facility] informed of patient's fall

## 2023-02-04 NOTE — ED Triage Notes (Signed)
"  From [facility], severe dementia. Found outside laying on left side. Staff said originally complained of left shoulder and left hip pain. No obvious injuries noted. Complains of no pain at this time" per EMS

## 2023-02-04 NOTE — ED Notes (Signed)
Patient transported to CT 

## 2023-02-04 NOTE — Discharge Instructions (Addendum)
As we discussed, [name]'s work-up in the ER today was reassuring for acute findings. CT imaging of his head did not reveal any emergent concerns. He did unfortunately fall a second time and had a second CT that was also normal. He was agitated upon arrival and he received a dose of ativan and has been a bit sleepy since then.   I recommend close monitoring over the next few days with close pcp follow-up and return precautions  Return if development of any new or worsening symptoms

## 2023-02-04 NOTE — ED Provider Notes (Signed)
[place] EMERGENCY DEPARTMENT AT [facility] Provider Note   CSN: [id number] Arrival date & time: 02/04/23  1227     History  Chief Complaint  Patient presents with   [name] is a 89 y.o. male.  Patient with history of A-fib on Xarelto, advanced dementia presents today from [facility] facility with complaints of fall. Patient with advanced dementia alert and oriented x 0 at baseline. Therefore, history provided by facility and EMS personnel. Per nursing staff, patient normally ambulates with a rollator at baseline and sits outside to eat his lunch. He was doing so earlier today and slid out of his rollator and hit the ground. Unsure if he hit his head or not, patient would not walk with nursing staff and therefore was transported here for evaluation. Upon arrival here, patient walking throughout the hallways is agitated saying he wants to leave, denies ever falling today. Denies any injuries or complaints. Discussed with nursing home staff, this is his baseline.  Level 5 caveat -- Dementia  The history is provided by the EMS personnel and the nursing home. No language interpreter was used.  Fall       Home Medications Prior to Admission medications   Medication Sig Start Date End Date Taking? Authorizing Provider  acetaminophen (TYLENOL) 325 MG tablet Take 2 tablets (650 mg total) by mouth every 6 (six) hours. 11/03/20   Johnson, Clanford L, MD  atorvastatin (LIPITOR) 20 MG tablet Take 1 tablet (20 mg total) by mouth daily at 6 PM. 09/14/18   Rinehuls, Kinnie Scales, PA-C  furosemide (LASIX) 20 MG tablet Take 20 mg by mouth every other day. 10/14/20   [provider]  [name] 17 GM/SCOOP powder Take by mouth. 05/12/21   [provider]  loratadine (CLARITIN) 10 MG tablet Take 10 mg by mouth daily.    [provider]  midodrine (PROAMATINE) 5 MG tablet Take 5 mg by mouth 2 (two) times daily with a meal.     [provider]  Rivaroxaban (XARELTO) 15 MG TABS tablet Take 15 mg by mouth daily with supper.    [provider]  vitamin B-12 (CYANOCOBALAMIN) 1000 MCG tablet Take 1 tablet (1,000 mcg total) by mouth daily. 11/04/20   [name], MD      Allergies    Patient has no known allergies.    Review of Systems   Review of Systems  Unable to perform ROS: Dementia    Physical Exam Updated Vital Signs BP 108/66 (BP Location: Right Arm)   Pulse 82   Temp 98 F (36.7 C) (Oral)   Resp 17   Ht 5\' 8"  (1.727 m)   Wt 72.6 kg   SpO2 97%   BMI 24.33 kg/m  Physical Exam Vitals and nursing note reviewed.  Constitutional:      General: He is not in acute distress.    Appearance: Normal appearance. He is normal weight. He is not ill-appearing, toxic-appearing or diaphoretic.  HENT:     Head: Normocephalic and atraumatic.     Comments: No racoon eyes No battle sign Eyes:     Extraocular Movements: Extraocular movements intact.     Pupils: Pupils are equal, round, and reactive to light.  Cardiovascular:     Rate and Rhythm: Normal rate.  Pulmonary:     Effort: Pulmonary effort is normal. No respiratory distress.  Abdominal:     General: Abdomen is flat.  Palpations: Abdomen is soft.     Tenderness: There is no abdominal tenderness.  Musculoskeletal:        General: Normal range of motion.     Cervical back: Normal and normal range of motion.     Thoracic back: Normal.     Lumbar back: Normal.     Comments: No midline tenderness, no stepoffs or deformity noted on palpation of cervical, thoracic, and lumbar spine  Patient ambulatory throughout the department with steady gait  Skin:    General: Skin is warm and dry.  Neurological:     Mental Status: He is alert. Mental status is at baseline.     Comments: Alert and oriented x 0 consistent with baseline  Psychiatric:        Mood and Affect: Mood normal.        Behavior: Behavior normal.     ED Results  / Procedures / Treatments   Labs (all labs ordered are listed, but only abnormal results are displayed) Labs Reviewed - No data to display  EKG None  Radiology CT Head Wo Contrast  Result Date: 02/04/2023 CLINICAL DATA:  Trauma EXAM: CT HEAD WITHOUT CONTRAST TECHNIQUE: Contiguous axial images were obtained from the base of the skull through the vertex without intravenous contrast. RADIATION DOSE REDUCTION: This exam was performed according to the departmental dose-optimization program which includes automated exposure control, adjustment of the mA and/or kV according to patient size and/or use of iterative reconstruction technique. COMPARISON:  CT brain 10/05/2021 FINDINGS: Brain: No acute territorial infarction, hemorrhage or intracranial mass. Moderate atrophy. Minimal white matter hypodensity. Stable ventricle size. Vascular: No hyperdense vessels.  No unexpected calcification Skull: Normal. Negative for fracture or focal lesion. Sinuses/Orbits: No acute finding. Other: None IMPRESSION: 1. No CT evidence for acute intracranial abnormality. 2. Atrophy and mild chronic small vessel ischemic changes of the white matter. Electronically Signed   By: Jasmine Pang M.D.   On: 02/04/2023 17:48   CT Head Wo Contrast  Result Date: 02/04/2023 CLINICAL DATA:  Dementia, found down EXAM: CT HEAD WITHOUT CONTRAST TECHNIQUE: Contiguous axial images were obtained from the base of the skull through the vertex without intravenous contrast. RADIATION DOSE REDUCTION: This exam was performed according to the departmental dose-optimization program which includes automated exposure control, adjustment of the mA and/or kV according to patient size and/or use of iterative reconstruction technique. COMPARISON:  10/05/2021 FINDINGS: Brain: No acute infarct or hemorrhage. Lateral ventricles and midline structures are unremarkable. Scattered chronic small-vessel ischemic changes throughout the periventricular white matter. No  acute extra-axial fluid collections. No mass effect. Vascular: No hyperdense vessel or unexpected calcification. Skull: Normal. Negative for fracture or focal lesion. Sinuses/Orbits: No acute finding. Other: None. IMPRESSION: 1. Stable head CT, no acute intracranial process. Electronically Signed   By: Sharlet Salina M.D.   On: 02/04/2023 15:00    Procedures Procedures    Medications Ordered in ED Medications  LORazepam (ATIVAN) injection 1 mg (1 mg Intramuscular Given 02/04/23 1331)    ED Course/ Medical Decision Making/ A&P                             Medical Decision Making Amount and/or Complexity of Data Reviewed Radiology: ordered.  Risk Prescription drug management.   This patient is a 89 y.o. male  who presents to the ED for concern of fall on thinners.   Differential diagnoses prior to evaluation: The emergent differential diagnosis includes,  but is not limited to,  trauma . This is not an exhaustive differential.   Past Medical History / Co-morbidities / Social History: history of A-fib on Xarelto, advanced dementia.  From [facility] facility.  Physical Exam: Physical exam performed. The pertinent findings include: Alert and oriented x 0 per baseline, ambulatory with steady gait.  Lab Tests/Imaging studies: I personally interpreted labs/imaging and the pertinent results include:  Ct imaging of head shows no acute findings. I agree with the radiologist interpretation.   Medications: I ordered medication including ativan for agitation.  I have reviewed the patients home medicines and have made adjustments as needed.   Disposition: After consideration of the diagnostic results and the patients response to treatment, I feel that emergency department workup does not suggest an emergent condition requiring admission or immediate intervention beyond what has been performed at this time. The plan is: discharge with return precautions and close outpatient  follow-up. The patient is safe for discharge and has been instructed to return immediately for worsening symptoms, change in symptoms or any other concerns.  1715: While patient was waiting for transport back to his facility, patient suddenly woke up and leaped over the railing and fell and struck his head on the ground. He was assisted by staff back into bed. Hematoma noted to the right face. PERRLA and EOMs intact. Patient remains at baseline mental status. No focal bony tenderness. Given patient is on xarelto, will obtain repeat head CT.   Repeat head CT has resulted and reveals  1. No CT evidence for acute intracranial abnormality. 2. Atrophy and mild chronic small vessel ischemic changes of the white matter.  I have personally reviewed and interpreted this imaging and agree with radiology interpretation.  Patient reassessed, he remains at his baseline mental status.  He is slightly more sleepy than before, likely due to receiving Ativan earlier today.  Patient stable for discharge back to his nursing facility with close outpatient follow-up and return precautions.  His legal guardian has been contacted by nursing staff.  Findings and plan of care discussed with supervising physician Dr. Hyacinth Meeker who is in agreement.   Final Clinical Impression(s) / ED Diagnoses Final diagnoses:  Fall, initial encounter    Rx / DC Orders ED Discharge Orders     None     An After Visit Summary was printed and given to the patient.     Vear Clock 02/04/23 Lennette Bihari, MD 02/07/23 [phone]

## 2023-02-04 NOTE — ED Notes (Signed)
Within seconds patient suddenly jumped up and climbed over bedrail falling head first on to floor. Marylene Land RN, myself and Dr. Hyacinth Meeker went running as he was fallling but was not able to make it to him in time to break the fall. Dr. Hyacinth Meeker assessed. Hematoma noted to right side of head.

## 2023-02-05 ENCOUNTER — Other Ambulatory Visit: Payer: Self-pay

## 2023-02-05 ENCOUNTER — Emergency Department (HOSPITAL_COMMUNITY): Payer: Medicare HMO

## 2023-02-05 ENCOUNTER — Encounter (HOSPITAL_COMMUNITY): Payer: Self-pay

## 2023-02-05 ENCOUNTER — Emergency Department (HOSPITAL_COMMUNITY)
Admission: EM | Admit: 2023-02-05 | Discharge: 2023-02-06 | Disposition: A | Discharge status: Home / Self Care | Payer: Medicare HMO | Attending: Emergency Medicine | Admitting: Emergency Medicine

## 2023-02-05 DIAGNOSIS — Y92129 Unspecified place in nursing home as the place of occurrence of the external cause: Secondary | ICD-10-CM | POA: Insufficient documentation

## 2023-02-05 DIAGNOSIS — R9431 Abnormal electrocardiogram [ECG] [EKG]: Secondary | ICD-10-CM | POA: Diagnosis not present

## 2023-02-05 DIAGNOSIS — S065XAA Traumatic subdural hemorrhage with loss of consciousness status unknown, initial encounter: Secondary | ICD-10-CM

## 2023-02-05 DIAGNOSIS — R1013 Epigastric pain: Secondary | ICD-10-CM | POA: Diagnosis not present

## 2023-02-05 DIAGNOSIS — R935 Abnormal findings on diagnostic imaging of other abdominal regions, including retroperitoneum: Secondary | ICD-10-CM | POA: Diagnosis not present

## 2023-02-05 DIAGNOSIS — R2689 Other abnormalities of gait and mobility: Secondary | ICD-10-CM | POA: Insufficient documentation

## 2023-02-05 DIAGNOSIS — R2681 Unsteadiness on feet: Secondary | ICD-10-CM | POA: Insufficient documentation

## 2023-02-05 DIAGNOSIS — Z7982 Long term (current) use of aspirin: Secondary | ICD-10-CM | POA: Insufficient documentation

## 2023-02-05 DIAGNOSIS — M19012 Primary osteoarthritis, left shoulder: Secondary | ICD-10-CM | POA: Diagnosis not present

## 2023-02-05 DIAGNOSIS — S0083XA Contusion of other part of head, initial encounter: Secondary | ICD-10-CM | POA: Insufficient documentation

## 2023-02-05 DIAGNOSIS — Z20822 Contact with and (suspected) exposure to covid-19: Secondary | ICD-10-CM | POA: Insufficient documentation

## 2023-02-05 DIAGNOSIS — N400 Enlarged prostate without lower urinary tract symptoms: Secondary | ICD-10-CM | POA: Diagnosis not present

## 2023-02-05 DIAGNOSIS — F0390 Unspecified dementia without behavioral disturbance: Secondary | ICD-10-CM | POA: Insufficient documentation

## 2023-02-05 DIAGNOSIS — Z7901 Long term (current) use of anticoagulants: Secondary | ICD-10-CM | POA: Insufficient documentation

## 2023-02-05 DIAGNOSIS — K862 Cyst of pancreas: Secondary | ICD-10-CM | POA: Insufficient documentation

## 2023-02-05 DIAGNOSIS — W19XXXA Unspecified fall, initial encounter: Secondary | ICD-10-CM | POA: Diagnosis not present

## 2023-02-05 DIAGNOSIS — I700 Atherosclerosis of aorta: Secondary | ICD-10-CM | POA: Insufficient documentation

## 2023-02-05 DIAGNOSIS — I959 Hypotension, unspecified: Secondary | ICD-10-CM | POA: Diagnosis not present

## 2023-02-05 DIAGNOSIS — R4182 Altered mental status, unspecified: Secondary | ICD-10-CM | POA: Diagnosis not present

## 2023-02-05 DIAGNOSIS — S065X0A Traumatic subdural hemorrhage without loss of consciousness, initial encounter: Secondary | ICD-10-CM | POA: Insufficient documentation

## 2023-02-05 DIAGNOSIS — M6281 Muscle weakness (generalized): Secondary | ICD-10-CM | POA: Diagnosis not present

## 2023-02-05 DIAGNOSIS — R9082 White matter disease, unspecified: Secondary | ICD-10-CM | POA: Insufficient documentation

## 2023-02-05 DIAGNOSIS — R7309 Other abnormal glucose: Secondary | ICD-10-CM | POA: Insufficient documentation

## 2023-02-05 DIAGNOSIS — R531 Weakness: Secondary | ICD-10-CM | POA: Diagnosis not present

## 2023-02-05 DIAGNOSIS — R55 Syncope and collapse: Secondary | ICD-10-CM | POA: Diagnosis not present

## 2023-02-05 DIAGNOSIS — K769 Liver disease, unspecified: Secondary | ICD-10-CM | POA: Diagnosis not present

## 2023-02-05 DIAGNOSIS — I4891 Unspecified atrial fibrillation: Secondary | ICD-10-CM | POA: Insufficient documentation

## 2023-02-05 DIAGNOSIS — R918 Other nonspecific abnormal finding of lung field: Secondary | ICD-10-CM | POA: Diagnosis not present

## 2023-02-05 DIAGNOSIS — R1111 Vomiting without nausea: Secondary | ICD-10-CM | POA: Diagnosis not present

## 2023-02-05 DIAGNOSIS — R059 Cough, unspecified: Secondary | ICD-10-CM | POA: Diagnosis not present

## 2023-02-05 DIAGNOSIS — I10 Essential (primary) hypertension: Secondary | ICD-10-CM | POA: Diagnosis not present

## 2023-02-05 DIAGNOSIS — R0902 Hypoxemia: Secondary | ICD-10-CM | POA: Insufficient documentation

## 2023-02-05 DIAGNOSIS — Z955 Presence of coronary angioplasty implant and graft: Secondary | ICD-10-CM | POA: Diagnosis not present

## 2023-02-05 DIAGNOSIS — F03918 Unspecified dementia, unspecified severity, with other behavioral disturbance: Secondary | ICD-10-CM | POA: Diagnosis not present

## 2023-02-05 DIAGNOSIS — J3489 Other specified disorders of nose and nasal sinuses: Secondary | ICD-10-CM | POA: Diagnosis not present

## 2023-02-05 DIAGNOSIS — S0990XA Unspecified injury of head, initial encounter: Secondary | ICD-10-CM | POA: Diagnosis not present

## 2023-02-05 DIAGNOSIS — M25512 Pain in left shoulder: Secondary | ICD-10-CM | POA: Diagnosis not present

## 2023-02-05 LAB — URINALYSIS, ROUTINE W REFLEX MICROSCOPIC
Bilirubin Urine: NEGATIVE
Glucose, UA: NEGATIVE mg/dL
Hgb urine dipstick: NEGATIVE
Ketones, ur: NEGATIVE mg/dL
Leukocytes,Ua: NEGATIVE
Nitrite: NEGATIVE
Protein, ur: NEGATIVE mg/dL
Specific Gravity, Urine: 1.019 (ref 1.005–1.030)
pH: 5 (ref 5.0–8.0)

## 2023-02-05 LAB — CBC WITH DIFFERENTIAL/PLATELET
Abs Immature Granulocytes: 0.01 10*3/uL (ref 0.00–0.07)
Basophils Absolute: 0 10*3/uL (ref 0.0–0.1)
Basophils Relative: 0 %
Eosinophils Absolute: 0.1 10*3/uL (ref 0.0–0.5)
Eosinophils Relative: 2 %
HCT: 38.3 % — ABNORMAL LOW (ref 39.0–52.0)
Hemoglobin: 12.9 g/dL — ABNORMAL LOW (ref 13.0–17.0)
Immature Granulocytes: 0 %
Lymphocytes Relative: 10 %
Lymphs Abs: 0.6 10*3/uL — ABNORMAL LOW (ref 0.7–4.0)
MCH: 32.7 pg (ref 26.0–34.0)
MCHC: 33.7 g/dL (ref 30.0–36.0)
MCV: 97.2 fL (ref 80.0–100.0)
Monocytes Absolute: 0.5 10*3/uL (ref 0.1–1.0)
Monocytes Relative: 8 %
Neutro Abs: 4.9 10*3/uL (ref 1.7–7.7)
Neutrophils Relative %: 80 %
Platelets: 143 10*3/uL — ABNORMAL LOW (ref 150–400)
RBC: 3.94 MIL/uL — ABNORMAL LOW (ref 4.22–5.81)
RDW: 12.8 % (ref 11.5–15.5)
WBC: 6.1 10*3/uL (ref 4.0–10.5)
nRBC: 0 % (ref 0.0–0.2)

## 2023-02-05 LAB — COMPREHENSIVE METABOLIC PANEL
ALT: 9 U/L (ref 0–44)
AST: 18 U/L (ref 15–41)
Albumin: 3.6 g/dL (ref 3.5–5.0)
Alkaline Phosphatase: 81 U/L (ref 38–126)
Anion gap: 5 (ref 5–15)
BUN: 26 mg/dL — ABNORMAL HIGH (ref 8–23)
CO2: 25 mmol/L (ref 22–32)
Calcium: 8.6 mg/dL — ABNORMAL LOW (ref 8.9–10.3)
Chloride: 105 mmol/L (ref 98–111)
Creatinine, Ser: 1.14 mg/dL (ref 0.61–1.24)
GFR, Estimated: >60 mL/min (ref >60)
Glucose, Bld: 122 mg/dL — ABNORMAL HIGH (ref 70–99)
Potassium: 3.7 mmol/L (ref 3.5–5.1)
Sodium: 135 mmol/L (ref 135–145)
Total Bilirubin: 2 mg/dL — ABNORMAL HIGH (ref 0.3–1.2)
Total Protein: 6.5 g/dL (ref 6.5–8.1)

## 2023-02-05 LAB — CULTURE, BLOOD (ROUTINE X 2)
Special Requests: ADEQUATE
Special Requests: ADEQUATE

## 2023-02-05 LAB — TSH: TSH: 6.804 u[IU]/mL — ABNORMAL HIGH (ref 0.350–4.500)

## 2023-02-05 LAB — TROPONIN I (HIGH SENSITIVITY)
Troponin I (High Sensitivity): 8 ng/L (ref <18)
Troponin I (High Sensitivity): 8 ng/L (ref <18)

## 2023-02-05 LAB — LACTIC ACID, PLASMA: Lactic Acid, Venous: 1.6 mmol/L (ref 0.5–1.9)

## 2023-02-05 LAB — SARS CORONAVIRUS 2 BY RT PCR: SARS Coronavirus 2 by RT PCR: NEGATIVE

## 2023-02-05 LAB — CBG MONITORING, ED
Glucose-Capillary: 105 mg/dL — ABNORMAL HIGH (ref 70–99)
Glucose-Capillary: 114 mg/dL — ABNORMAL HIGH (ref 70–99)

## 2023-02-05 LAB — AMMONIA: Ammonia: 23 umol/L (ref 9–35)

## 2023-02-05 MED ORDER — ACETAMINOPHEN 325 MG PO TABS: 650.0000 mg | ORAL_TABLET | Freq: Four times a day (QID) | ORAL | Status: DC

## 2023-02-05 MED ORDER — LORAZEPAM 2 MG/ML IJ SOLN
1.0000 mg | Freq: Once | INTRAMUSCULAR | Status: AC
  Administered 2023-02-05: 1 mg via INTRAVENOUS
  Filled 2023-02-05: qty 1

## 2023-02-05 MED ORDER — RINGERS IV SOLN: INTRAVENOUS | Status: DC

## 2023-02-05 MED ORDER — MIDODRINE HCL 5 MG PO TABS
5.0000 mg | ORAL_TABLET | Freq: Two times a day (BID) | ORAL | Status: DC
  Administered 2023-02-06: 5 mg via ORAL
  Filled 2023-02-05: qty 1

## 2023-02-05 MED ORDER — ATORVASTATIN CALCIUM 10 MG PO TABS: 20.0000 mg | ORAL_TABLET | Freq: Every day | ORAL | Status: DC

## 2023-02-05 MED ORDER — LORAZEPAM 2 MG/ML IJ SOLN
0.5000 mg | Freq: Once | INTRAMUSCULAR | Status: AC
  Administered 2023-02-05: 0.5 mg via INTRAVENOUS
  Filled 2023-02-05: qty 1

## 2023-02-05 MED ORDER — LACTATED RINGERS IV BOLUS
1000.0000 mL | Freq: Once | INTRAVENOUS | Status: AC
  Administered 2023-02-05: 1000 mL via INTRAVENOUS

## 2023-02-05 MED ORDER — LORATADINE 10 MG PO TABS
10.0000 mg | ORAL_TABLET | Freq: Every day | ORAL | Status: DC
  Administered 2023-02-06: 10 mg via ORAL
  Filled 2023-02-05: qty 1

## 2023-02-05 MED ORDER — VITAMIN B-12 1000 MCG PO TABS
1000.0000 ug | ORAL_TABLET | Freq: Every day | ORAL | Status: DC
  Administered 2023-02-06: 1000 ug via ORAL
  Filled 2023-02-05: qty 1

## 2023-02-05 MED ORDER — LACTATED RINGERS IV SOLN: INTRAVENOUS | Status: DC

## 2023-02-05 NOTE — ED Notes (Signed)
Pt given 0.5 mg ativan per MD order d/t concerns for agitation. Pt pull off all VS monitoring, took off safety mitten, and began to kick his legs attempting to hurt sitter.

## 2023-02-05 NOTE — ED Notes (Signed)
Bair hugger has been removed per MD order. Oral temp stable at 98.1

## 2023-02-05 NOTE — ED Notes (Signed)
Pt has been agitated with previous staff has sitter at bedside. VS monitoring and safety mittens have been replaced. Has seizure pads on stretcher. Pt IVF continuing.

## 2023-02-05 NOTE — ED Notes (Signed)
Discussed pts discharge with [name], Director of SNF, who verbalized concern for ability to care for pt and would like to talk to legal guardian Chance Homero Fellers. Per SNF, pt had been alert and ambulatory with walker. Pt no cooperative at this time to ambulate. Charge RN Wilkie Aye updated on concerns form SNF

## 2023-02-05 NOTE — ED Notes (Addendum)
Guardian at [facility] updated on pt CT report and plan moving forward.

## 2023-02-05 NOTE — ED Notes (Signed)
Placed a posey pad under pt

## 2023-02-05 NOTE — ED Notes (Signed)
Followed up and updated guardian at [facility]

## 2023-02-05 NOTE — ED Notes (Signed)
Pt is yelling, cussing, pulled all of his EKG wires off, attempting to pull his IV out.

## 2023-02-05 NOTE — ED Notes (Signed)
Urinalysis delayed due to inability to obtain urine sample. Materials called for proper supplies.

## 2023-02-05 NOTE — ED Provider Notes (Signed)
Patient had a second CT scan of the head that showed improvement of the subdural.  Patient's assisted living refused to take the patient back so a TOC consult was placed   Bethann Berkshire, MD 02/05/23 2215

## 2023-02-05 NOTE — ED Triage Notes (Signed)
Facility called EMS out for hypotension and AMS. Per EMS pt has ran a low BP with them and they gave 100 mL of NS via IV. Pt has not had his daily meds so far today. Pt has hx of dementia and 2 falls yesterday.

## 2023-02-05 NOTE — ED Notes (Addendum)
DSS guardian (Chance) must call to consent for admission or any procedures. DSS guardian has been updated on pt status

## 2023-02-05 NOTE — ED Notes (Signed)
Patient transported to CT 

## 2023-02-05 NOTE — ED Provider Notes (Signed)
[place] EMERGENCY DEPARTMENT AT [facility] Provider Note   CSN: [id number] Arrival date & time: 02/05/23  0844     History  Chief Complaint  Patient presents with   Hypotension    [name] is a 89 y.o. male.  Level 5 caveat secondary to dementia.  Patient brought in by EMS from nursing home after an unresponsive episode.  Possibly had episode of low blood pressure.  Patient unable to give any history.  Of note patient was here yesterday after a fall.  Head CT was negative.  Patient fell again once in the department and was rescanned no acute findings.  He is on Xarelto for A-fib.  The history is provided by the EMS personnel.  Altered Mental Status Presenting symptoms: partial responsiveness   Most recent episode:  Today Episode history:  Single Timing:  Constant Progression:  Unchanged Chronicity:  New Context: dementia, head injury and nursing home resident   Recent head injury:  Within the last 24 hours      Home Medications Prior to Admission medications   Medication Sig Start Date End Date Taking? Authorizing Provider  acetaminophen (TYLENOL) 325 MG tablet Take 2 tablets (650 mg total) by mouth every 6 (six) hours. 11/03/20   [name], MD  acetaminophen (TYLENOL) 500 MG tablet Take by mouth. 12/30/19   [provider]  aspirin EC 81 MG tablet Take by mouth. 10/05/14   [provider]  atorvastatin (LIPITOR) 20 MG tablet Take 1 tablet (20 mg total) by mouth daily at 6 PM. 09/14/18   Rinehuls, Kinnie Scales, PA-C  doxycycline (VIBRAMYCIN) 100 MG capsule Take by mouth. 10/04/19   [provider]  finasteride (PROSCAR) 5 MG tablet Take by mouth. 10/05/14   [provider]  furosemide (LASIX) 20 MG tablet Take 20 mg by mouth every other day. 10/14/20   [provider]  [name] 17 GM/SCOOP powder Take by mouth. 05/12/21   [provider]  loratadine (CLARITIN) 10 MG tablet Take 10 mg by mouth  daily.    [provider]  midodrine (PROAMATINE) 5 MG tablet Take 5 mg by mouth 2 (two) times daily with a meal.    [provider]  potassium chloride SA (KLOR-CON M) 20 MEQ tablet Take 1 tablet by mouth daily. 12/26/19   [provider]  Rivaroxaban (XARELTO) 15 MG TABS tablet Take 15 mg by mouth daily with supper.    [provider]  tamsulosin (FLOMAX) 0.4 MG CAPS capsule Take by mouth. 10/05/14   [provider]  vitamin B-12 (CYANOCOBALAMIN) 1000 MCG tablet Take 1 tablet (1,000 mcg total) by mouth daily. 11/04/20   [name], MD      Allergies    Patient has no known allergies.    Review of Systems   Review of Systems  Unable to perform ROS: Dementia    Physical Exam Updated Vital Signs Ht 5\' 8"  (1.727 m)   Wt 75.2 kg   SpO2 92%   BMI 25.19 kg/m  Physical Exam Vitals and nursing note reviewed.  Constitutional:      General: He is not in acute distress.    Appearance: Normal appearance. He is well-developed.  HENT:     Head: Normocephalic.     Comments: He has some bruising on his right forehead and under his right eye. Eyes:     Conjunctiva/sclera: Conjunctivae normal.     Pupils: Pupils are equal, round, and reactive to  light.  Cardiovascular:     Rate and Rhythm: Normal rate and regular rhythm.     Heart sounds: No murmur heard. Pulmonary:     Effort: Pulmonary effort is normal. No respiratory distress.     Breath sounds: Normal breath sounds.  Abdominal:     Palpations: Abdomen is soft.     Tenderness: There is no abdominal tenderness. There is no guarding or rebound.  Musculoskeletal:        General: No deformity. Normal range of motion.     Cervical back: Neck supple.  Skin:    General: Skin is warm and dry.     Capillary Refill: Capillary refill takes less than 2 seconds.  Neurological:     General: No focal deficit present.     Mental Status: He is disoriented.     Comments: Patient is not following  commands but moving all extremities without any focal deficit.     ED Results / Procedures / Treatments   Labs (all labs ordered are listed, but only abnormal results are displayed) Labs Reviewed  CBC WITH DIFFERENTIAL/PLATELET - Abnormal; Notable for the following components:      Result Value   RBC 3.94 (*)    Hemoglobin 12.9 (*)    HCT 38.3 (*)    Platelets 143 (*)    Lymphs Abs 0.6 (*)    All other components within normal limits  COMPREHENSIVE METABOLIC PANEL - Abnormal; Notable for the following components:   Glucose, Bld 122 (*)    BUN 26 (*)    Calcium 8.6 (*)    Total Bilirubin 2.0 (*)    All other components within normal limits  TSH - Abnormal; Notable for the following components:   TSH 6.804 (*)    All other components within normal limits  CBG MONITORING, ED - Abnormal; Notable for the following components:   Glucose-Capillary 105 (*)    All other components within normal limits  CBG MONITORING, ED - Abnormal; Notable for the following components:   Glucose-Capillary 114 (*)    All other components within normal limits  CULTURE, BLOOD (ROUTINE X 2)  CULTURE, BLOOD (ROUTINE X 2)  SARS CORONAVIRUS 2 BY RT PCR  URINALYSIS, ROUTINE W REFLEX MICROSCOPIC  AMMONIA  LACTIC ACID, PLASMA  TROPONIN I (HIGH SENSITIVITY)  TROPONIN I (HIGH SENSITIVITY)    EKG EKG Interpretation Date/Time:  Monday February 05 2023 08:54:38 EDT Ventricular Rate:  61 PR Interval:    QRS Duration:  107 QT Interval:  431 QTC Calculation: 435 R Axis:   119  Text Interpretation: Atrial fibrillation Anteroseptal infarct, age indeterminate Minimal ST elevation, inferior leads No significant change since prior 3/23 Confirmed by Meridee Score [id number]) on 02/05/2023 9:11:57 AM  Radiology CT Maxillofacial WO CM  Result Date: 02/05/2023 CLINICAL DATA:  Trace gas identified in the cavernous sinuses on previous head CT. EXAM: CT MAXILLOFACIAL WITHOUT CONTRAST TECHNIQUE: Multidetector CT imaging of  the maxillofacial structures was performed. Multiplanar CT image reconstructions were also generated. RADIATION DOSE REDUCTION: This exam was performed according to the departmental dose-optimization program which includes automated exposure control, adjustment of the mA and/or kV according to patient size and/or use of iterative reconstruction technique. COMPARISON:  None Available. FINDINGS: Osseous: No fracture or mandibular dislocation. No destructive process. Orbits: Negative. No traumatic or inflammatory finding. Sinuses: The visualized paranasal sinuses and mastoid air cells are clear. Soft tissues: Negative. Limited intracranial: The trace gas seen in the bilateral cavernous sinuses previously has nearly resolved  in the interval with only some trace gas remaining in the left cavernous sinus. Patient also had some trace gas in the right infratemporal fossa on the previous study, also resolved. IMPRESSION: Near complete resolution of the trace gas seen in the bilateral cavernous sinuses and right infratemporal fossa on the previous study. As such, findings on that exam are most consistent with venous gas introduced with IV access. No evidence for an acute fracture in the maxillofacial anatomy. Electronically Signed   By: Kennith Center M.D.   On: 02/05/2023 16:07   CT Head Wo Contrast  Result Date: 02/05/2023 CLINICAL DATA:  Head trauma, minor (Age >= 65y) EXAM: CT HEAD WITHOUT CONTRAST TECHNIQUE: Contiguous axial images were obtained from the base of the skull through the vertex without intravenous contrast. RADIATION DOSE REDUCTION: This exam was performed according to the departmental dose-optimization program which includes automated exposure control, adjustment of the mA and/or kV according to patient size and/or use of iterative reconstruction technique. COMPARISON:  CT Head 02/04/23 FINDINGS: Limitations: Motion degraded exam. Brain: Compared to prior exam there is new low-density subdural fluid  collection along the right frontal convexity (series 4, image 26) measuring up to 4 mm. There is no significant midline shift. There is a small amount of air in the cavernous sinus, along the right temporal bone/lateral orbital wall, and along the pterygoid plates. This is nonspecific and may be secondary to recent injection, but could also be seen in the setting of traumatic injury. Recommend further evaluation with a facial bone CT. Vascular: No hyperdense vessel or unexpected calcification. Skull: Normal. Negative for fracture or focal lesion. Sinuses/Orbits: No middle ear or mastoid effusion. Paranasal sinuses are clear. Bilateral lens replacement. Orbits are otherwise unremarkable. Other: None. IMPRESSION: 1. Compared to prior exam there is new low-density subdural fluid collection along the right frontal convexity measuring up to 4 mm. Recommend short term follow up CT to ensure stability. 2. Small amount of air in the cavernous sinus, along the right temporal bone/lateral orbital wall, and along the pterygoid plates. This is nonspecific and may be secondary to recent injection, but could also be seen in the setting of traumatic injury. Recommend further evaluation with a facial bone CT. Electronically Signed   By: Lorenza Cambridge M.D.   On: 02/05/2023 14:16   DG Chest Port 1 View  Result Date: 02/05/2023 CLINICAL DATA:  Hypotension and altered mental status. History of bladder cancer. EXAM: PORTABLE CHEST 1 VIEW COMPARISON:  07/26/2021 FINDINGS: Unchanged enlarged cardiac silhouette and mediastinal contours with atherosclerotic plaque within the thoracic aorta. There is persistent rightward deviation of the tracheal air column at the level of the aortic arch. Post coronary stent placement. No focal airspace opacities. No pleural effusion or pneumothorax no evidence of edema. No acute osseous abnormalities. IMPRESSION: No acute cardiopulmonary disease. Electronically Signed   By: Simonne Come M.D.   On:  02/05/2023 10:31   CT Head Wo Contrast  Result Date: 02/04/2023 CLINICAL DATA:  Trauma EXAM: CT HEAD WITHOUT CONTRAST TECHNIQUE: Contiguous axial images were obtained from the base of the skull through the vertex without intravenous contrast. RADIATION DOSE REDUCTION: This exam was performed according to the departmental dose-optimization program which includes automated exposure control, adjustment of the mA and/or kV according to patient size and/or use of iterative reconstruction technique. COMPARISON:  CT brain 10/05/2021 FINDINGS: Brain: No acute territorial infarction, hemorrhage or intracranial mass. Moderate atrophy. Minimal white matter hypodensity. Stable ventricle size. Vascular: No hyperdense vessels.  No  unexpected calcification Skull: Normal. Negative for fracture or focal lesion. Sinuses/Orbits: No acute finding. Other: None IMPRESSION: 1. No CT evidence for acute intracranial abnormality. 2. Atrophy and mild chronic small vessel ischemic changes of the white matter. Electronically Signed   By: Jasmine Pang M.D.   On: 02/04/2023 17:48   CT Head Wo Contrast  Result Date: 02/04/2023 CLINICAL DATA:  Dementia, found down EXAM: CT HEAD WITHOUT CONTRAST TECHNIQUE: Contiguous axial images were obtained from the base of the skull through the vertex without intravenous contrast. RADIATION DOSE REDUCTION: This exam was performed according to the departmental dose-optimization program which includes automated exposure control, adjustment of the mA and/or kV according to patient size and/or use of iterative reconstruction technique. COMPARISON:  10/05/2021 FINDINGS: Brain: No acute infarct or hemorrhage. Lateral ventricles and midline structures are unremarkable. Scattered chronic small-vessel ischemic changes throughout the periventricular white matter. No acute extra-axial fluid collections. No mass effect. Vascular: No hyperdense vessel or unexpected calcification. Skull: Normal. Negative for fracture  or focal lesion. Sinuses/Orbits: No acute finding. Other: None. IMPRESSION: 1. Stable head CT, no acute intracranial process. Electronically Signed   By: Sharlet Salina M.D.   On: 02/04/2023 15:00    Procedures Procedures    Medications Ordered in ED Medications  lactated ringers infusion ( Intravenous New Bag/Given 02/05/23 0923)  lactated ringers bolus 1,000 mL (0 mLs Intravenous Stopped 02/05/23 1457)  LORazepam (ATIVAN) injection 1 mg (1 mg Intravenous Given 02/05/23 1451)  LORazepam (ATIVAN) injection 0.5 mg (0.5 mg Intravenous Given 02/05/23 1636)    ED Course/ Medical Decision Making/ A&P Clinical Course as of 02/05/23 1757  Mon Feb 05, 2023  9562 Rectal temp is 95.9.  Bear hugger applied. [MB]  0919 Chest x-ray interpreted by me as no acute infiltrate.  Awaiting radiology reading. [MB]  1357 Patient is now awake and agitated. [MB]  1428 Patient CT showing small subdural.  They also commented upon some air near the right temple area and feel a CT max face is indicated. [MB]  1437 Apparently patient has a legal guardian who is from [place].  Nurse has been updating him on status. [MB]  1446 Discussed with neurosurgery PA for Dr. Johnsie Cancel.  They have reviewed the images and are recommending a repeat head CT in 6 hours.  If no progression he is appropriate for discharge.  They are also recommending holding his Xarelto for a week. [MB]    Clinical Course User Index [MB] Terrilee Files, MD                             Medical Decision Making Amount and/or Complexity of Data Reviewed Labs: ordered. Radiology: ordered.  Risk Prescription drug management.   This patient complains of increased lethargy after fall; this involves an extensive number of treatment Options and is a complaint that carries with it a high risk of complications and morbidity. The differential includes bleed, contusion, sepsis, Sirs, dehydration, metabolic derangement  I ordered, reviewed and interpreted  labs, which included CBC normal white count stable hemoglobin, chemistries unremarkable, urinalysis without signs of infection, TSH elevated needs to be trended, troponins flat, lactate normal I ordered medication IV fluids IV Ativan for agitation and reviewed PMP when indicated. I ordered imaging studies which included chest x-ray, CT head, CT max face and I independently    visualized and interpreted imaging which showed new subdural Additional history obtained from EMS Previous records obtained and  reviewed in epic including recent ED visits I consulted neurosurgery Dr. Johnsie Cancel and discussed lab and imaging findings and discussed disposition.  Cardiac monitoring reviewed, sinus rhythm Social determinants considered, patient with dementia unable to advocate for self Critical Interventions: None  After the interventions stated above, I reevaluated the patient and found patient to be well-appearing although agitated Admission and further testing considered, per neurosurgery recommendations needs repeat CT in 6 hours from prior to make sure that hematoma is not expanding.  If this is negative he likely can return back to his facility.  Will need to hold anticoagulation.  If hematoma is enlarging will need further consultation with neurosurgery.         Final Clinical Impression(s) / ED Diagnoses Final diagnoses:  Subdural hematoma [facility])    Rx / DC Orders ED Discharge Orders     None         Terrilee Files, MD 02/05/23 1800

## 2023-02-05 NOTE — Discharge Instructions (Addendum)
Hold Xarelto for 1 week.   Follow up with your md if any problems  HH PT recommended by PT at hospital. MD placed orders, please follow up with preferred agency.

## 2023-02-05 NOTE — ED Notes (Signed)
Pt has returned from CT and fluids have been restarted.

## 2023-02-06 ENCOUNTER — Emergency Department (HOSPITAL_COMMUNITY)
Admission: EM | Admit: 2023-02-06 | Discharge: 2023-02-27 | Disposition: A | Discharge status: Home / Self Care | Payer: Medicare HMO | Attending: Emergency Medicine | Admitting: Emergency Medicine

## 2023-02-06 ENCOUNTER — Encounter (HOSPITAL_COMMUNITY): Payer: Self-pay | Admitting: Emergency Medicine

## 2023-02-06 ENCOUNTER — Other Ambulatory Visit: Payer: Self-pay

## 2023-02-06 DIAGNOSIS — R4182 Altered mental status, unspecified: Secondary | ICD-10-CM | POA: Insufficient documentation

## 2023-02-06 DIAGNOSIS — Z7901 Long term (current) use of anticoagulants: Secondary | ICD-10-CM | POA: Insufficient documentation

## 2023-02-06 DIAGNOSIS — I4891 Unspecified atrial fibrillation: Secondary | ICD-10-CM | POA: Insufficient documentation

## 2023-02-06 DIAGNOSIS — R799 Abnormal finding of blood chemistry, unspecified: Secondary | ICD-10-CM | POA: Insufficient documentation

## 2023-02-06 DIAGNOSIS — F03918 Unspecified dementia, unspecified severity, with other behavioral disturbance: Secondary | ICD-10-CM | POA: Insufficient documentation

## 2023-02-06 DIAGNOSIS — S065X0A Traumatic subdural hemorrhage without loss of consciousness, initial encounter: Secondary | ICD-10-CM | POA: Diagnosis not present

## 2023-02-06 DIAGNOSIS — R1013 Epigastric pain: Secondary | ICD-10-CM | POA: Insufficient documentation

## 2023-02-06 DIAGNOSIS — R9431 Abnormal electrocardiogram [ECG] [EKG]: Secondary | ICD-10-CM | POA: Diagnosis not present

## 2023-02-06 DIAGNOSIS — F03911 Unspecified dementia, unspecified severity, with agitation: Secondary | ICD-10-CM

## 2023-02-06 DIAGNOSIS — R410 Disorientation, unspecified: Secondary | ICD-10-CM | POA: Diagnosis not present

## 2023-02-06 DIAGNOSIS — Z20822 Contact with and (suspected) exposure to covid-19: Secondary | ICD-10-CM | POA: Insufficient documentation

## 2023-02-06 DIAGNOSIS — F0390 Unspecified dementia without behavioral disturbance: Secondary | ICD-10-CM | POA: Insufficient documentation

## 2023-02-06 DIAGNOSIS — F29 Unspecified psychosis not due to a substance or known physiological condition: Secondary | ICD-10-CM | POA: Diagnosis not present

## 2023-02-06 MED ORDER — LORAZEPAM 2 MG/ML IJ SOLN
2.0000 mg | Freq: Once | INTRAMUSCULAR | Status: AC
  Administered 2023-02-07: 2 mg via INTRAMUSCULAR
  Filled 2023-02-06: qty 1

## 2023-02-06 MED ORDER — HALOPERIDOL LACTATE 5 MG/ML IJ SOLN
2.0000 mg | Freq: Once | INTRAMUSCULAR | Status: AC
  Administered 2023-02-06: 2 mg via INTRAMUSCULAR
  Filled 2023-02-06: qty 1

## 2023-02-06 MED ORDER — HALOPERIDOL LACTATE 5 MG/ML IJ SOLN
2.0000 mg | Freq: Once | INTRAMUSCULAR | Status: DC
  Filled 2023-02-06: qty 1

## 2023-02-06 MED ORDER — ACETAMINOPHEN 325 MG PO TABS
650.0000 mg | ORAL_TABLET | Freq: Four times a day (QID) | ORAL | Status: DC
  Administered 2023-02-07 – 2023-02-27 (×41): 650 mg via ORAL
  Filled 2023-02-06 (×44): qty 2

## 2023-02-06 MED ORDER — LORATADINE 10 MG PO TABS
10.0000 mg | ORAL_TABLET | Freq: Every day | ORAL | Status: DC
  Administered 2023-02-06 – 2023-02-27 (×18): 10 mg via ORAL
  Filled 2023-02-06 (×20): qty 1

## 2023-02-06 MED ORDER — VITAMIN B-12 1000 MCG PO TABS
1000.0000 ug | ORAL_TABLET | Freq: Every day | ORAL | Status: DC
  Administered 2023-02-06 – 2023-02-27 (×18): 1000 ug via ORAL
  Filled 2023-02-06 (×20): qty 1

## 2023-02-06 MED ORDER — LORAZEPAM 1 MG PO TABS: 1.0000 mg | ORAL_TABLET | Freq: Once | ORAL | Status: DC

## 2023-02-06 MED ORDER — MIDODRINE HCL 5 MG PO TABS
5.0000 mg | ORAL_TABLET | Freq: Two times a day (BID) | ORAL | Status: DC
  Administered 2023-02-08 – 2023-02-27 (×27): 5 mg via ORAL
  Filled 2023-02-06 (×29): qty 1

## 2023-02-06 MED ORDER — ATORVASTATIN CALCIUM 10 MG PO TABS
20.0000 mg | ORAL_TABLET | Freq: Every day | ORAL | Status: DC
  Administered 2023-02-08 – 2023-02-27 (×15): 20 mg via ORAL
  Filled 2023-02-06 (×16): qty 2

## 2023-02-06 MED ORDER — HALOPERIDOL LACTATE 5 MG/ML IJ SOLN
5.0000 mg | Freq: Once | INTRAMUSCULAR | Status: AC
  Administered 2023-02-06: 5 mg via INTRAMUSCULAR
  Filled 2023-02-06: qty 1

## 2023-02-06 MED ORDER — RIVAROXABAN 15 MG PO TABS: 15.0000 mg | ORAL_TABLET | Freq: Every day | ORAL | Status: DC

## 2023-02-06 NOTE — ED Notes (Addendum)
Wet linens removed from underneath pt. While cleaning bed and attempting to apply clean brief, pt communicating verbal threats and making attempts to hit and kick and this RN and NT After getting pt setting and covered up in warm blankets, pt attempting to fall asleep

## 2023-02-06 NOTE — ED Notes (Signed)
Pt is getting irritable and making multiple attempts to climb out of bed. EDP made aware

## 2023-02-06 NOTE — ED Notes (Signed)
Patient being very combative and trying to get out of bed

## 2023-02-06 NOTE — Evaluation (Signed)
Physical Therapy Evaluation Patient Details Name: [name] MRN: [id number] DOB: 1929-11-02 Today's Date: 02/06/2023  History of Present Illness  [name] is a 89 y.o. male.     Patient with history of A-fib on Xarelto, advanced dementia presents today from [facility] memory care facility with complaints of fall. Patient with advanced dementia alert and oriented x 0 at baseline. Therefore, history provided by facility and EMS personnel. Per nursing staff, patient normally ambulates with a rollator at baseline and sits outside to eat his lunch. He was doing so earlier today and slid out of his rollator and hit the ground. Unsure if he hit his head or not, patient would not walk with nursing staff and therefore was transported here for evaluation. Upon arrival here, patient walking throughout the hallways is agitated saying he wants to leave, denies ever falling today. Denies any injuries or complaints. Discussed with nursing home staff, this is his baseline.   Clinical Impression  Patient demonstrates slow labored movement for sitting up at bedside, required repeated verbal/tactile cueing for following instructions with fair/good carryover, able to ambulate in room with slow labored cadence without loss of balance and limited mostly due to fatigue and BLE weakness.  Patient put back to bed after therapy - nursing staff aware.  Plan: patient to bed discharged back to ALF today and discharged from PT to nursing staff for ambulation daily as tolerated for length of stay.         If plan is discharge home, recommend the following: A lot of help with walking and/or transfers;A lot of help with bathing/dressing/bathroom;Assistance with cooking/housework;Help with stairs or ramp for entrance   Can travel by private vehicle        Equipment Recommendations None recommended by PT  Recommendations for Other Services       Functional Status Assessment Patient has had a recent  decline in their functional status and demonstrates the ability to make significant improvements in function in a reasonable and predictable amount of time.     Precautions / Restrictions Precautions Precautions: Fall Restrictions Weight Bearing Restrictions: No      Mobility  Bed Mobility Overal bed mobility: Needs Assistance Bed Mobility: Supine to Sit, Sit to Supine     Supine to sit: Min assist Sit to supine: Min assist   General bed mobility comments: increased time, labored movement    Transfers Overall transfer level: Needs assistance Equipment used: Rolling walker (2 wheels) Transfers: Sit to/from Stand Sit to Stand: Min assist           General transfer comment: slow labored movement    Ambulation/Gait Ambulation/Gait assistance: Min assist, Mod assist Gait Distance (Feet): 25 Feet Assistive device: Rolling walker (2 wheels) Gait Pattern/deviations: Decreased step length - right, Decreased step length - left, Decreased stride length, Scissoring Gait velocity: slow     General Gait Details: slow labored cadence with most difficulty maing turns due to scissoring of legs, limited mostly due to fatigue  Stairs            Wheelchair Mobility     Tilt Bed    Modified Rankin (Stroke Patients Only)       Balance Overall balance assessment: Needs assistance Sitting-balance support: Feet supported, No upper extremity supported Sitting balance-Leahy Scale: Fair Sitting balance - Comments: fair/good seated at EOB   Standing balance support: Reliant on assistive device for balance, No upper extremity supported, Bilateral upper extremity supported Standing balance-Leahy Scale: Poor Standing balance  comment: fair/poor using RW                             Pertinent Vitals/Pain Pain Assessment Pain Assessment: No/denies pain    Home Living Family/patient expects to be discharged to:: Assisted living                 Home  Equipment: Rollator (4 wheels)      Prior Function Prior Level of Function : Needs assist       Physical Assist : Mobility (physical);ADLs (physical) Mobility (physical): Bed mobility;Transfers;Gait;Stairs   Mobility Comments: household ambulator using Rollator ADLs Comments: Assisted by ALF staff     Hand Dominance        Extremity/Trunk Assessment   Upper Extremity Assessment Upper Extremity Assessment: Overall WFL for tasks assessed    Lower Extremity Assessment Lower Extremity Assessment: Generalized weakness    Cervical / Trunk Assessment Cervical / Trunk Assessment: Kyphotic  Communication   Communication: Other (comment) (slow to answer questions, able to follow most instuctions)  Cognition Arousal/Alertness: Awake/alert Behavior During Therapy: Flat affect Overall Cognitive Status: History of cognitive impairments - at baseline                                 General Comments: able to follow directions with repeated verbal/tactile cueing        General Comments      Exercises     Assessment/Plan    PT Assessment All further PT needs can be met in the next venue of care  PT Problem List Decreased strength;Decreased activity tolerance;Decreased balance;Decreased mobility       PT Treatment Interventions      PT Goals (Current goals can be found in the Care Plan section)  Acute Rehab PT Goals Patient Stated Goal: return home with ALF staff to assist PT Goal Formulation: With patient Time For Goal Achievement: 02/06/23    Frequency       Co-evaluation               AM-PAC PT "6 Clicks" Mobility  Outcome Measure Help needed turning from your back to your side while in a flat bed without using bedrails?: A Little Help needed moving from lying on your back to sitting on the side of a flat bed without using bedrails?: A Little Help needed moving to and from a bed to a chair (including a wheelchair)?: A Little Help needed  standing up from a chair using your arms (e.g., wheelchair or bedside chair)?: A Little Help needed to walk in hospital room?: A Lot Help needed climbing 3-5 steps with a railing? : A Lot 6 Click Score: 16    End of Session   Activity Tolerance: Patient limited by fatigue;Patient tolerated treatment well Patient left: in bed;with call bell/phone within reach;with bed alarm set;with nursing/sitter in room Nurse Communication: Mobility status PT Visit Diagnosis: Unsteadiness on feet (R26.81);Other abnormalities of gait and mobility (R26.89);Muscle weakness (generalized) (M62.81)    Time: 0981-1914 PT Time Calculation (min) (ACUTE ONLY): 20 min   Charges:   PT Evaluation $PT Eval Moderate Complexity: 1 Mod PT Treatments $Therapeutic Activity: 8-22 mins PT General Charges $$ ACUTE PT VISIT: 1 Visit         10:21 AM, 02/06/23 Ocie Bob, MPT Physical Therapist with [facility] [phone] office [phone] mobile phone

## 2023-02-06 NOTE — ED Notes (Signed)
Pt agitated. Attempting to exit stretcher. Sitter at bedside attempted to redirect pt that was unsuccessful. Pt started to attempt to kick sitter. IM haldol given per MD order for increasing agitation. Pt repositioned with sitter and NT Lanora Manis. Pericare provided for incontinence episode. New bed sheets, brief, and bed pad provided.

## 2023-02-06 NOTE — ED Notes (Signed)
SW gives verbal permission to evaluate and treat patient.

## 2023-02-06 NOTE — ED Notes (Signed)
Pt is confused to where he has removed hospital gown and brief. Pt remain in bed. Sitter at bedside

## 2023-02-06 NOTE — ED Notes (Signed)
SW reports that pt became violent and uncooperative at facility. No falls reported.

## 2023-02-06 NOTE — ED Notes (Signed)
Chance Shellia Carwin Social [phone] If after hours call oncall social worker for permissions for treatment.

## 2023-02-06 NOTE — ED Provider Notes (Incomplete)
Per nursing facility they were unable to take him. [name] and his social worker need to have

## 2023-02-06 NOTE — ED Notes (Signed)
Pt frustrated and angry that they cannot get up d/t being a fall risk. Sitter attempting to redirect pt and pt kicked Comptroller

## 2023-02-06 NOTE — Progress Notes (Signed)
CSW spoke to [facility] and at this time they have no plan for placement as the facility is refusing to take patient back at this time due to the concerns of caring for the patient with current behaviors however LG stated that this is not normal for the patient to behave that way LG has concerns about patient's overall health. TOC will need to follow up with facility to inquire about the patient going back to the facility he was at. LG would like to get the patient place LG also stated that patient can be placed outside of [facility] if there are no facilites that will accept him local.

## 2023-02-06 NOTE — ED Provider Notes (Signed)
Patient awaiting assistance with bed placement, and this morning he is in no distress, though he does have episodic irritability. Patient accepted to return to his nursing facility.   Adrian Munch, MD 02/06/23 [phone]
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
## 2023-02-06 NOTE — ED Notes (Signed)
Patient grabbing bed rail trying to get up, pulling on IV. Placed patient into hand mitts. Patient now resting in room

## 2023-02-06 NOTE — ED Notes (Signed)
CSW spoke with Lyla Son DSS supervisor who states pts LG has spoken with [name] at [facility] and they are agreeable to accepting pt back today. CSW updated MD of need for [facility] PT orders as recommended by PT. CSW updated AVS to reflect need for [facility] PT. [facility] will set up at D/C. TOC signing off.

## 2023-02-07 DIAGNOSIS — F03918 Unspecified dementia, unspecified severity, with other behavioral disturbance: Secondary | ICD-10-CM | POA: Insufficient documentation

## 2023-02-07 DIAGNOSIS — S065X0A Traumatic subdural hemorrhage without loss of consciousness, initial encounter: Secondary | ICD-10-CM | POA: Diagnosis not present

## 2023-02-07 NOTE — Evaluation (Signed)
Physical Therapy Evaluation Patient Details Name: [name] MRN: [id number] DOB: 02/11/1930 Today's Date: 02/07/2023  History of Present Illness  [name] is a 89 y.o. male.     Patient with history of A-fib on Xarelto, advanced dementia presents today from [facility] facility with complaints of fall. Patient with advanced dementia alert and oriented x 0 at baseline. Therefore, history provided by facility and EMS personnel. Per nursing staff, patient normally ambulates with a rollator at baseline and sits outside to eat his lunch. He was doing so earlier today and slid out of his rollator and hit the ground. Unsure if he hit his head or not, patient would not walk with nursing staff and therefore was transported here for evaluation. Upon arrival here, patient walking throughout the hallways is agitated saying he wants to leave, denies ever falling today. Denies any injuries or complaints. Discussed with nursing home staff, this is his baseline.   Clinical Impression  Patient demonstrates slow labored movement for sitting up at bedside with c/o increasing low back pain, limited to a few slow labored side steps with mostly shuffling of feet due to weakness and unsafe to attempt stepping away from bedside due to fall risk.  Patient put back to bed with Max assist to reposition.  Patient will benefit from continued skilled physical therapy in hospital and recommended venue below to increase strength, balance, endurance for safe ADLs and gait.          If plan is discharge home, recommend the following: A lot of help with walking and/or transfers;A lot of help with bathing/dressing/bathroom;Assistance with cooking/housework;Help with stairs or ramp for entrance   Can travel by private vehicle   No    Equipment Recommendations None recommended by PT  Recommendations for Other Services       Functional Status Assessment Patient has had a recent decline in their  functional status and demonstrates the ability to make significant improvements in function in a reasonable and predictable amount of time.     Precautions / Restrictions Precautions Precautions: Fall Restrictions Weight Bearing Restrictions: No      Mobility  Bed Mobility Overal bed mobility: Needs Assistance Bed Mobility: Supine to Sit, Sit to Supine     Supine to sit: Mod assist Sit to supine: Mod assist   General bed mobility comments: increased time, labored movement with c/o increasing low back pain    Transfers Overall transfer level: Needs assistance Equipment used: Rolling walker (2 wheels) Transfers: Sit to/from Stand Sit to Stand: Mod assist           General transfer comment: unsteady labored movement with frequent buckling of knees    Ambulation/Gait Ambulation/Gait assistance: Mod assist, Max assist Gait Distance (Feet): 5 Feet Assistive device: Rolling walker (2 wheels) Gait Pattern/deviations: Decreased step length - right, Decreased step length - left, Decreased stride length, Knees buckling, Shuffle, Trunk flexed Gait velocity: slow     General Gait Details: limited to a few slow labored unsteady side steps with mostly shuffling of RLE due to weakness  Stairs            Wheelchair Mobility     Tilt Bed    Modified Rankin (Stroke Patients Only)       Balance Overall balance assessment: Needs assistance Sitting-balance support: Feet supported, Bilateral upper extremity supported Sitting balance-Leahy Scale: Fair Sitting balance - Comments: seated at EOB   Standing balance support: During functional activity, Bilateral upper extremity supported,  Reliant on assistive device for balance Standing balance-Leahy Scale: Poor Standing balance comment: using RW                             Pertinent Vitals/Pain Pain Assessment Pain Assessment: Faces Faces Pain Scale: Hurts even more Pain Location: low back    Home  Living Family/patient expects to be discharged to:: Assisted living                 Home Equipment: Rollator (4 wheels)      Prior Function Prior Level of Function : Needs assist       Physical Assist : Mobility (physical);ADLs (physical) Mobility (physical): Bed mobility;Transfers;Gait;Stairs   Mobility Comments: household ambulator using Rollator ADLs Comments: Assisted by ALF staff     Hand Dominance   Dominant Hand: Right    Extremity/Trunk Assessment   Upper Extremity Assessment Upper Extremity Assessment: Generalized weakness    Lower Extremity Assessment Lower Extremity Assessment: Generalized weakness    Cervical / Trunk Assessment Cervical / Trunk Assessment: Kyphotic  Communication      Cognition Arousal/Alertness: Awake/alert Behavior During Therapy: Anxious Overall Cognitive Status: History of cognitive impairments - at baseline                                 General Comments: able to follow directions with repeated verbal/tactile cueing        General Comments      Exercises     Assessment/Plan    PT Assessment Patient needs continued PT services  PT Problem List Decreased strength;Decreased activity tolerance;Decreased balance;Decreased mobility       PT Treatment Interventions DME instruction;Gait training;Stair training;Functional mobility training;Therapeutic activities;Therapeutic exercise;Balance training;Patient/family education    PT Goals (Current goals can be found in the Care Plan section)  Acute Rehab PT Goals Patient Stated Goal: return home PT Goal Formulation: With patient Time For Goal Achievement: 02/21/23 Potential to Achieve Goals: Good    Frequency Min 2X/week     Co-evaluation               AM-PAC PT "6 Clicks" Mobility  Outcome Measure Help needed turning from your back to your side while in a flat bed without using bedrails?: A Little Help needed moving from lying on your back to  sitting on the side of a flat bed without using bedrails?: A Lot Help needed moving to and from a bed to a chair (including a wheelchair)?: A Lot Help needed standing up from a chair using your arms (e.g., wheelchair or bedside chair)?: A Lot Help needed to walk in hospital room?: A Lot Help needed climbing 3-5 steps with a railing? : Total 6 Click Score: 12    End of Session   Activity Tolerance: Patient limited by fatigue;Patient tolerated treatment well;Patient limited by pain Patient left: in bed;with call bell/phone within reach;with nursing/sitter in room Nurse Communication: Mobility status PT Visit Diagnosis: Unsteadiness on feet (R26.81);Other abnormalities of gait and mobility (R26.89);Muscle weakness (generalized) (M62.81)    Time: 1020-1046 PT Time Calculation (min) (ACUTE ONLY): 26 min   Charges:   PT Evaluation $PT Eval Moderate Complexity: 1 Mod PT Treatments $Therapeutic Activity: 23-37 mins PT General Charges $$ ACUTE PT VISIT: 1 Visit         1:47 PM, 02/07/23 Ocie Bob, MPT Physical Therapist with [facility] [phone] office  [phone] mobile phone

## 2023-02-07 NOTE — [place] FL2 (Signed)
[place] MEDICAID FL2 LEVEL OF CARE FORM     IDENTIFICATION  Patient Name: [name] Birthdate: 1929-08-31 Sex: male Admission Date (Current Location): 02/06/2023  [place] and [place] Number:  [employer] and Address:  [facility],  [address] [place] [address]      Provider Number: [id number]  Attending Physician Name and Address:  Default, Provider, MD  Relative Name and Phone Number:  Armandina Gemma (Legal Guardian)  [phone]    Current Level of Care: Hospital Recommended Level of Care: Skilled Nursing Facility Prior Approval Number:    Date Approved/Denied:   PASRR Number: [id number]  Discharge Plan:      Current Diagnoses: Patient Active Problem List   Diagnosis Date Noted   Syncope 11/02/2020   Macrocytic anemia 11/02/2020   Left rib fracture 11/02/2020   Scalp hematoma 11/02/2020   Fall 11/01/2020   Hygroma 09/10/2018   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   Chest pain 12/29/2013   Atrial fibrillation (HCC) 12/29/2013   VERTIGO 02/04/2010   [name] DEFICIENCY 01/31/2010   Memory loss 01/27/2010   PARESTHESIA 01/27/2010   Hyperlipidemia 11/02/2009   CORONARY ARTERY DISEASE 11/02/2009   SINUSITIS, ACUTE 11/02/2009   EPISTAXIS 11/02/2009   COLONIC POLYPS, HX OF 11/02/2009    Orientation RESPIRATION BLADDER Height & Weight     Self, Place  Normal Continent Weight: 75.2 kg Height:  5\' 8"  (172.7 cm)  BEHAVIORAL SYMPTOMS/MOOD NEUROLOGICAL BOWEL NUTRITION STATUS      Continent Diet (Regular no salt added)  AMBULATORY STATUS COMMUNICATION OF NEEDS Skin   Extensive Assist Verbally Normal                       Personal Care Assistance Level of Assistance  Bathing, Feeding, Dressing Bathing Assistance: Maximum assistance Feeding assistance: Limited assistance Dressing Assistance: Maximum assistance     Functional Limitations Info  Sight, Hearing, Speech Sight Info:  Adequate Hearing Info: Adequate Speech Info: Adequate    SPECIAL CARE FACTORS FREQUENCY  PT (By licensed PT)     PT Frequency: 5 Times a week              Contractures Contractures Info: Not present    Additional Factors Info  Code Status, Allergies Code Status Info: Full Allergies Info: NKDA           Current Medications (02/07/2023):  This is the current hospital active medication list Current Facility-Administered Medications  Medication Dose Route Frequency Provider Last Rate Last Admin   acetaminophen (TYLENOL) tablet 650 mg  650 mg Oral Q6H Rondel Baton, MD       atorvastatin (LIPITOR) tablet 20 mg  20 mg Oral q1800 Rondel Baton, MD       cyanocobalamin (VITAMIN [name]) tablet 1,000 mcg  1,000 mcg Oral Daily Rondel Baton, MD   1,000 mcg at 02/06/23 1956   loratadine (CLARITIN) tablet 10 mg  10 mg Oral Daily Rondel Baton, MD   10 mg at 02/06/23 1956   midodrine (PROAMATINE) tablet 5 mg  5 mg Oral BID WC Rondel Baton, MD       Current Outpatient Medications  Medication Sig Dispense Refill   acetaminophen (TYLENOL) 325 MG tablet Take 2 tablets (650 mg total) by mouth every 6 (six) hours.     atorvastatin (LIPITOR) 20 MG tablet Take 1 tablet (20 mg total) by mouth daily at 6 PM.  loratadine (CLARITIN) 10 MG tablet Take 10 mg by mouth daily.     midodrine (PROAMATINE) 5 MG tablet Take 5 mg by mouth 2 (two) times daily with a meal.     Rivaroxaban (XARELTO) 15 MG TABS tablet Take 15 mg by mouth daily with supper.     vitamin B-12 (CYANOCOBALAMIN) 1000 MCG tablet Take 1 tablet (1,000 mcg total) by mouth daily. 30 tablet      Discharge Medications: Please see discharge summary for a list of discharge medications.  Relevant Imaging Results:  Relevant Lab Results:   Additional Information SS# [id number]  Leitha Bleak, RN

## 2023-02-07 NOTE — ED Notes (Signed)
Pt combative with staff and attempting to get up and climb over rails of the bed. See [facility]

## 2023-02-07 NOTE — ED Notes (Addendum)
Went in to assess pt. Pt linen and bedding changed due to being soiled. RN noticed that pt has 3 meal trays in room that had not been attempted to being opened. RN asked pt if he wanted something to yet and pt stated "yes". RN have tylenol for pain that was due crushed in apple sauce. Pt ate half container of apple sauce and was given sips of water. Pt states that he does not want anything else at this time.

## 2023-02-07 NOTE — ED Notes (Signed)
CSW updated yesterday by LG that pt was on way back to hospital. At that time LG requested that pt be seen by psych for possible medication recommendations as behaviors are new for pt. CSW updated LG that MD would have to order this. CSW updated MD and placed orders for both PT and TTS. TOC to follow for recommendations.

## 2023-02-07 NOTE — TOC Initial Note (Signed)
Transition of Care [facility]) - Initial/Assessment Note    Patient Details  Name: [name] MRN: [id number] Date of Birth: 1930/05/26  [facility] CM/SW Contact:    Leitha Bleak, RN Phone Number: 02/07/2023, 1:55 PM  Clinical Narrative:      Patient sent back to ED. PT is recommending SNF. Medical work up continuing. FL2 completed and sent out for bed offers.              Expected Discharge Plan: Skilled Nursing Facility Barriers to Discharge: Continued Medical Work up   Patient Goals and CMS Choice Patient states their goals for this hospitalization and ongoing recovery are:: PT recommending SNF          Expected Discharge Plan and Services       Living arrangements for the past 2 months: Assisted Living Facility                    Prior Living Arrangements/Services Living arrangements for the past 2 months: Assisted Living Facility Lives with:: Facility Resident             Current home services: DME    Activities of Daily Living       Emotional Assessment       Orientation: : Oriented to Self Alcohol / Substance Use: Not Applicable Psych Involvement: No (comment)  Admission diagnosis:  Combative behavior Patient Active Problem List   Diagnosis Date Noted   Syncope 11/02/2020   Macrocytic anemia 11/02/2020   Left rib fracture 11/02/2020   Scalp hematoma 11/02/2020   Fall 11/01/2020   Hygroma 09/10/2018   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   Chest pain 12/29/2013   Atrial fibrillation (HCC) 12/29/2013   VERTIGO 02/04/2010   [name] DEFICIENCY 01/31/2010   Memory loss 01/27/2010   PARESTHESIA 01/27/2010   Hyperlipidemia 11/02/2009   CORONARY ARTERY DISEASE 11/02/2009   SINUSITIS, ACUTE 11/02/2009   EPISTAXIS 11/02/2009   COLONIC POLYPS, HX OF 11/02/2009   PCP:  Benetta Spar, MD Pharmacy:   [facility] [place] - [address] [place]  [address] Phone: [phone] Fax: [phone]     Social Determinants of Health (SDOH) Social History: SDOH Screenings   Depression (PHQ2-9): Low Risk  (10/23/2018)  Tobacco Use: Medium Risk (02/06/2023)   SDOH Interventions:    Readmission Risk Interventions     No data to display

## 2023-02-07 NOTE — ED Provider Notes (Signed)
Emergency Medicine Observation Re-evaluation Note  [name] is a 89 y.o. male, seen on rounds today.  Pt initially presented to the ED for complaints of Aggressive Behavior Currently, the patient is resting comfortably.  Physical Exam  BP (!) 145/80 (BP Location: Left Arm)   Pulse 95   Temp 98.2 F (36.8 C) (Oral)   Resp 19   Ht 5\' 8"  (1.727 m)   Wt 75.2 kg   SpO2 93%   BMI 25.21 kg/m  Physical Exam Constitutional:      General: He is not in acute distress.    Appearance: Normal appearance.  HENT:     Head: Normocephalic and atraumatic.     Nose: No congestion or rhinorrhea.  Eyes:     General:        Right eye: No discharge.        Left eye: No discharge.     Extraocular Movements: Extraocular movements intact.     Pupils: Pupils are equal, round, and reactive to light.  Cardiovascular:     Rate and Rhythm: Normal rate and regular rhythm.     Heart sounds: No murmur heard. Pulmonary:     Effort: No respiratory distress.     Breath sounds: No wheezing or rales.  Abdominal:     General: There is no distension.     Tenderness: There is no abdominal tenderness.  Musculoskeletal:        General: Normal range of motion.     Cervical back: Normal range of motion.  Skin:    General: Skin is warm and dry.  Neurological:     General: No focal deficit present.     Mental Status: He is alert.      ED Course / MDM  EKG:EKG Interpretation Date/Time:  Tuesday February 06 2023 19:44:15 EDT Ventricular Rate:  101 PR Interval:    QRS Duration:  92 QT Interval:  346 QTC Calculation: 449 R Axis:   162  Text Interpretation: Atrial fibrillation Anterior infarct, old low voltage in limb leads Confirmed by Vonita Moss [phone]) on 02/07/2023 12:02:03 AM  I have reviewed the labs performed to date as well as medications administered while in observation.  Recent changes in the last 24 hours include patient refusing his medications this morning, social work to look for  placement, guardian requesting TTS consult for [employer].  Aggressive behavior overnight requiring chemical restraint  Plan  Current plan is for social work placement, TTS.    Glendora Score, MD 02/07/23 [phone]

## 2023-02-07 NOTE — ED Notes (Signed)
Pt chucks changed due to being wet. Pt awake and screaming profanity. Clean gown placed on pt. Refused to drink any water. Pt covered with a clean sheet and immediately fell back asleep.

## 2023-02-07 NOTE — Consult Note (Signed)
Telepsych Consultation   Reason for Consult: Agitation Referring Physician:  Glendora Score, MD  Location of Patient:  APA 15 Location of Provider: Other: [facility]  Patient Identification: [name] MRN:  [id number] Principal Diagnosis: Aggressive behavior due to dementia [facility]) Diagnosis:  Principal Problem:   Aggressive behavior due to dementia [facility])   Subjective:   [name]  is a 89 y.o. male patient presented with severe agitation.    Attempted to to follow-up with assessment due to agitation and medication management per consult request.  It was reported that patient is currently resting and is not able to participate with assessment at this time.  -Orders placed for EKG.  - provider to follow-up with medication management when patient is able to participate in assessment.   Total Time spent with patient: 15 minutes   HPI:  Per admission assessment note:"89 year old male with a history of dementia and atrial fibrillation on Xarelto who was set up for discharge back to his facility ([facility]).  The patient arrived he was reportedly agitated and they feel that they cannot care for him today brought him back to the emergency department.  No additional falls.  On exam does have some bruising on the right side of his face that was present at his prior visit.  He is moving all 4 extremities.  Pupils are equal and reactive.  Is conversant but is confused which appears to be his baseline."    Past Psychiatric History:     Past Medical History:  Past Medical History:  Diagnosis Date   [name] deficiency 2011   Bladder cancer (HCC)    Malignet Neoplasm   CAD (coronary artery disease)    Cerebral infarction (HCC)    Dementia (HCC)    Hx of colonic polyp    Hyperlipidemia    Memory loss 2011   Unspecified atrial fibrillation (HCC)     Past Surgical History:  Procedure Laterality Date   CHOLECYSTECTOMY     Family History:   Family History  Problem Relation Age of Onset   Coronary artery disease Other    Dementia Other    Family Psychiatric  History:  Social History:  Social History   Substance and Sexual Activity  Alcohol Use No     Social History   Substance and Sexual Activity  Drug Use Never    Social History   Socioeconomic History   Marital status: Married    Spouse name: Not on file   Number of children: Not on file   Years of education: Not on file   Highest education level: Not on file  Occupational History   Not on file  Tobacco Use   Smoking status: Former    Current packs/day: 0.00    Types: Cigarettes    Quit date: 12/29/1973    Years since quitting: 49.1   Smokeless tobacco: Never  Vaping Use   Vaping status: Never Used  Substance and Sexual Activity   Alcohol use: No   Drug use: Never   Sexual activity: Not Currently  Other Topics Concern   Not on file  Social History Narrative   Regular Exercise- No   Social Determinants of Health   Financial Resource Strain: Not on file  Food Insecurity: Not on file  Transportation Needs: Not on file  Physical Activity: Not on file  Stress: Not on file  Social Connections: Not on file   Additional Social History:    Allergies:  No  Known Allergies  Labs: No results found for this or any previous visit (from the past 48 hour(s)).  Medications:  Current Facility-Administered Medications  Medication Dose Route Frequency Provider Last Rate Last Admin   acetaminophen (TYLENOL) tablet 650 mg  650 mg Oral Q6H Rondel Baton, MD       atorvastatin (LIPITOR) tablet 20 mg  20 mg Oral q1800 Rondel Baton, MD       cyanocobalamin (VITAMIN [name]) tablet 1,000 mcg  1,000 mcg Oral Daily Rondel Baton, MD   1,000 mcg at 02/06/23 1956   loratadine (CLARITIN) tablet 10 mg  10 mg Oral Daily Rondel Baton, MD   10 mg at 02/06/23 1956   midodrine (PROAMATINE) tablet 5 mg  5 mg Oral BID WC Rondel Baton, MD        Current Outpatient Medications  Medication Sig Dispense Refill   acetaminophen (TYLENOL) 325 MG tablet Take 2 tablets (650 mg total) by mouth every 6 (six) hours.     atorvastatin (LIPITOR) 20 MG tablet Take 1 tablet (20 mg total) by mouth daily at 6 PM.     loratadine (CLARITIN) 10 MG tablet Take 10 mg by mouth daily.     midodrine (PROAMATINE) 5 MG tablet Take 5 mg by mouth 2 (two) times daily with a meal.     Rivaroxaban (XARELTO) 15 MG TABS tablet Take 15 mg by mouth daily with supper.     vitamin B-12 (CYANOCOBALAMIN) 1000 MCG tablet Take 1 tablet (1,000 mcg total) by mouth daily. 30 tablet     Musculoskeletal:  Psychiatric Specialty Exam:  Presentation  General Appearance: N/A Eye Contact: Speech: Speech Volume: Handedness:  Mood and Affect  Mood: N/A Affect:  Thought Process  Thought Processes: N/A Descriptions of Associations: Orientation: Thought Content: History of Schizophrenia/Schizoaffective disorder:N/A Duration of Psychotic Symptoms: Hallucinations: Ideas of Reference: Suicidal Thoughts: Homicidal Thoughts:  Sensorium  Memory:N/A Judgment: Insight:  Art therapist  Concentration:N/A Attention Span: Recall: Fund of Knowledge: Language:  Psychomotor Activity  Psychomotor Activity:N/A  Assets  Assets: N/A  Sleep  Sleep:N/A   Physical Exam: Physical Exam Psychiatric:        Mood and Affect: Mood normal.        Behavior: Behavior normal.    Review of Systems  Psychiatric/Behavioral:         Patient wasn't assessed   All other systems reviewed and are negative.  Blood pressure (!) 145/80, pulse 95, temperature 98.2 F (36.8 C), temperature source Oral, resp. rate 19, height 5\' 8"  (1.727 m), weight 75.2 kg, SpO2 93%. Body mass index is 25.21 kg/m.  Treatment Plan Summary: Daily contact with patient to assess and evaluate symptoms and progress in treatment and Medication management  Disposition: Provider to follow-up for  medication management   This service was provided via telemedicine using a 2-way, interactive audio and video technology.  Names of all persons participating in this telemedicine service and their role in this encounter. Name: Hillery Jacks Role: NP             Oneta Rack, NP 02/07/2023 1:58 PM

## 2023-02-07 NOTE — Plan of Care (Signed)
  Problem: Acute Rehab PT Goals(only PT should resolve) Goal: Pt Will Go Supine/Side To Sit Outcome: Progressing Flowsheets (Taken 02/07/2023 1349) Pt will go Supine/Side to Sit:  with min guard assist  with minimal assist Goal: Patient Will Transfer Sit To/From Stand Outcome: Progressing Flowsheets (Taken 02/07/2023 1349) Patient will transfer sit to/from stand:  with min guard assist  with minimal assist Goal: Pt Will Transfer Bed To Chair/Chair To Bed Outcome: Progressing Flowsheets (Taken 02/07/2023 1349) Pt will Transfer Bed to Chair/Chair to Bed:  min guard assist  with min assist Goal: Pt Will Ambulate Outcome: Progressing Flowsheets (Taken 02/07/2023 1349) Pt will Ambulate:  50 feet  with min guard assist  with minimal assist  with rolling walker   1:52 PM, 02/07/23 Ocie Bob, MPT Physical Therapist with [facility] [phone] office [phone] mobile phone

## 2023-02-07 NOTE — ED Notes (Signed)
Pt continues to rest. Has not eaten breatkfast or lunch today. EKG and vitals obtained.

## 2023-02-07 NOTE — ED Notes (Signed)
Attempted to give pt his morning medications. Pt refusing at this time. Will try again shortly.

## 2023-02-07 NOTE — ED Provider Notes (Signed)
89 year old male with a history of dementia and atrial fibrillation on Xarelto who was set up for discharge back to his facility ([facility]).  The patient arrived he was reportedly agitated and they feel that they cannot care for him today brought him back to the emergency department.  No additional falls.  On exam does have some bruising on the right side of his face that was present at his prior visit.  He is moving all 4 extremities.  Pupils are equal and reactive.  Is conversant but is confused which appears to be his baseline.  Was seen here after a fall and did have a small subdural hematoma that appeared to have resolved on repeat imaging.  Altered mental status workup at that time was unremarkable.  Neurosurgery was consulted and they are recommending holding his Xarelto for a week so we will resume this on 02/12/2023.  Placed in TOC boarder status.  Will have social work see him again and look for placement.   Rondel Baton, MD 02/07/23 [phone]

## 2023-02-08 ENCOUNTER — Encounter (HOSPITAL_COMMUNITY): Payer: Self-pay | Admitting: Psychiatry

## 2023-02-08 DIAGNOSIS — F03918 Unspecified dementia, unspecified severity, with other behavioral disturbance: Secondary | ICD-10-CM | POA: Diagnosis not present

## 2023-02-08 DIAGNOSIS — S065X0A Traumatic subdural hemorrhage without loss of consciousness, initial encounter: Secondary | ICD-10-CM | POA: Diagnosis not present

## 2023-02-08 MED ORDER — TRAZODONE HCL 50 MG PO TABS
25.0000 mg | ORAL_TABLET | Freq: Two times a day (BID) | ORAL | Status: DC | PRN
  Administered 2023-02-09 – 2023-02-27 (×3): 25 mg via ORAL
  Filled 2023-02-08 (×3): qty 1

## 2023-02-08 MED ORDER — QUETIAPINE FUMARATE 25 MG PO TABS
25.0000 mg | ORAL_TABLET | Freq: Every day | ORAL | Status: DC
  Administered 2023-02-08 – 2023-02-25 (×11): 25 mg via ORAL
  Filled 2023-02-08 (×13): qty 1

## 2023-02-08 NOTE — ED Notes (Signed)
Pt took meds crushed in applesauce;  TTS cart set up in room

## 2023-02-08 NOTE — ED Notes (Signed)
TTS machine placed in room. 

## 2023-02-08 NOTE — ED Notes (Signed)
Pt awake and sitting up in bed, eating  breakfast
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
## 2023-02-08 NOTE — ED Notes (Signed)
Rounding Pt sleeping Sitter outside room

## 2023-02-08 NOTE — ED Notes (Signed)
CSW noted that psych has seen pt and made med recommendations. CSW updated pts legal guardian of what is currently going on. Pts referral has been sent out for review, at this time there are no bed offers. TOC to follow.

## 2023-02-08 NOTE — ED Notes (Signed)
Medicated per MAR Pt was pleasant cooperative and rambling

## 2023-02-08 NOTE — ED Notes (Signed)
Sitters preformed bed bath on pt All linens clean Pt calm and cooperative Vitals assessed and listed.

## 2023-02-08 NOTE — ED Notes (Signed)
Rounding- Pt sleeping

## 2023-02-08 NOTE — ED Notes (Signed)
TTS attempted but pt unable to answer questions

## 2023-02-08 NOTE — ED Notes (Signed)
Pt sleeping. 

## 2023-02-09 DIAGNOSIS — S065X0A Traumatic subdural hemorrhage without loss of consciousness, initial encounter: Secondary | ICD-10-CM | POA: Diagnosis not present

## 2023-02-09 LAB — URINALYSIS, ROUTINE W REFLEX MICROSCOPIC
Bacteria, UA: NONE SEEN
Bilirubin Urine: NEGATIVE
Glucose, UA: NEGATIVE mg/dL
Ketones, ur: 5 mg/dL — AB
Leukocytes,Ua: NEGATIVE
Nitrite: NEGATIVE
Protein, ur: NEGATIVE mg/dL
Specific Gravity, Urine: 1.017 (ref 1.005–1.030)
pH: 6 (ref 5.0–8.0)

## 2023-02-09 MED ORDER — ZIPRASIDONE MESYLATE 20 MG IM SOLR
10.0000 mg | Freq: Once | INTRAMUSCULAR | Status: AC
  Administered 2023-02-09: 10 mg via INTRAMUSCULAR
  Filled 2023-02-09: qty 20

## 2023-02-09 NOTE — ED Notes (Signed)
Pt has no bed offers at this time. TOC continues to send out referral for placement. TOC to follow.

## 2023-02-09 NOTE — ED Notes (Signed)
Pt again has stripped clothes and brief off and urinated in the floor, Pt changed total x 2 assist

## 2023-02-09 NOTE — ED Notes (Signed)
Pt increasingly aggitated. Placed in mittens, calmly encouraged to remain in bad. Security at bedside.

## 2023-02-09 NOTE — ED Notes (Signed)
This tech went into room to assist patient to eat breakfast, pt ate three bites of eggs and said he was through eating. Pt drank one full apple juice and one cup of water.

## 2023-02-09 NOTE — ED Notes (Signed)
Received report from [employer]. Pt resting in bed. Polite and cooperative at this time. Moving all extremities. Pt at baseline at this time. Water given.

## 2023-02-09 NOTE — ED Provider Notes (Signed)
Emergency Medicine Observation Re-evaluation Note  [name] is a 89 y.o. male, seen on rounds today.  Pt initially presented to the ED for complaints of Aggressive Behavior Currently, the patient is sleeping.  Physical Exam  BP 111/79 (BP Location: Right Arm)   Pulse 97   Temp 98.9 F (37.2 C) (Axillary)   Resp 14   Ht 5\' 8"  (1.727 m)   Wt 75.2 kg   SpO2 94%   BMI 25.21 kg/m  Physical Exam General: No acute distress Cardiac: Well-perfused Lungs: Nonlabored Psych: Calm  ED Course / MDM  EKG:EKG Interpretation Date/Time:  Tuesday February 06 2023 19:44:15 EDT Ventricular Rate:  101 PR Interval:    QRS Duration:  92 QT Interval:  346 QTC Calculation: 449 R Axis:   162  Text Interpretation: Atrial fibrillation Anterior infarct, old low voltage in limb leads Confirmed by Vonita Moss [phone]) on 02/07/2023 12:02:03 AM  I have reviewed the labs performed to date as well as medications administered while in observation.  Recent changes in the last 24 hours include patient was psychiatrically cleared.  Plan  Current plan is for [facility] assistance for placement versus return to facility.    Terrilee Files, MD 02/09/23 [phone]

## 2023-02-09 NOTE — ED Notes (Signed)
Pt much calmer now. No more verbal abuse of staff. Cooperated for EKG. Mittens removed.

## 2023-02-09 NOTE — ED Notes (Signed)
Pt stripped clothes and took brief off and urinated in the bed. Techs into room to change patient bedding and apply new brief, pt was a max assist to change, after changing lunch was offered to pt which he refused. Pt now resting comfortably.

## 2023-02-09 NOTE — Progress Notes (Signed)
Per Shearon Stalls pt has been psych cleared. This CSW will now remove pt from the [facility] shift report. TOC to continue to assist with discharge needs.   Maryjean Ka, MSW, LCSWA 02/09/2023 2:20 AM

## 2023-02-09 NOTE — ED Notes (Addendum)
Pt notified RN and NT that he had episode of incontience in bed. Linens changed and patient cleaned up. Patient cooperative with linen change, medication, and vitals.

## 2023-02-09 NOTE — ED Notes (Signed)
Pt getting agitated. Comfort measures given. Unable to console.
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
## 2023-02-09 NOTE — ED Notes (Signed)
Pt confused. Reports needs to urinate. Provided urinal. Produced about 50 cc of clear amber urine.

## 2023-02-10 ENCOUNTER — Emergency Department (HOSPITAL_COMMUNITY): Payer: Medicare HMO

## 2023-02-10 DIAGNOSIS — I700 Atherosclerosis of aorta: Secondary | ICD-10-CM | POA: Diagnosis not present

## 2023-02-10 DIAGNOSIS — R918 Other nonspecific abnormal finding of lung field: Secondary | ICD-10-CM | POA: Diagnosis not present

## 2023-02-10 DIAGNOSIS — S065X0A Traumatic subdural hemorrhage without loss of consciousness, initial encounter: Secondary | ICD-10-CM | POA: Diagnosis not present

## 2023-02-10 MED ORDER — ZIPRASIDONE MESYLATE 20 MG IM SOLR
10.0000 mg | Freq: Once | INTRAMUSCULAR | Status: AC
  Administered 2023-02-10: 10 mg via INTRAMUSCULAR
  Filled 2023-02-10: qty 20

## 2023-02-10 MED ORDER — STERILE WATER FOR INJECTION IJ SOLN
INTRAMUSCULAR | Status: AC
  Administered 2023-02-10: 10 mL
  Filled 2023-02-10: qty 10

## 2023-02-10 NOTE — ED Notes (Signed)
Patient 2+ assist to chair. Patient sat in chair for approx before becoming restless. Sheets changed on bed and patient 2+ assist back to bed. Patient remained calm and cooperative throughout process

## 2023-02-10 NOTE — ED Notes (Signed)
RN called to room by sitter to report pt choked on breakfast. Pt was sitting all the way up at that time. Sitter reported she provided thrust to back to assist in removal of object. Pt did NOT loose consciousness. MD has been notified and an XRAY has been completed. Pt has pulse ox on to monitor o2 levels.

## 2023-02-10 NOTE — ED Notes (Signed)
Patient current meds on hold due to patients questionable ability to safely swallow

## 2023-02-10 NOTE — ED Notes (Signed)
Pt was given Geodon for prevention of falling, attempted to keep patient in bed for safety but he continued to get out of bed for over 45 minutes. Pt now resting and changed.
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
## 2023-02-10 NOTE — ED Notes (Addendum)
Patient getting agitated and trying to get up again. Bed is titled up to prevent patient from falling out of the bed.

## 2023-02-10 NOTE — ED Notes (Signed)
Attempted to feed patient, patient sat up in bed, and started with nectar thick tea and patient started coughing and choking on drink. Afraid to attempt  food.

## 2023-02-10 NOTE — ED Notes (Signed)
Requested medication to assist in calming patient and to assist in therapeutic rest. Pt is increasingly altered and attempting to get out of bed. Sitter is present and bed is in it's safest position, however pt is a risk for fall or injury.

## 2023-02-10 NOTE — ED Notes (Signed)
Pt non compliant with pulse ox. Pt o2 sats WDL for around 1 hour. Pulse ox removed
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
## 2023-02-10 NOTE — ED Notes (Signed)
Notified MD on patients inability to safely swallow.

## 2023-02-10 NOTE — ED Notes (Signed)
Medications given via crushing medication and mixing with applesauce

## 2023-02-10 NOTE — ED Notes (Signed)
Fed patient a couple bites of oatmeal and he got severely choked. He was able to cough it back up and tolerate sips of water afterwards.He is now propped upright with bed/pillow to help. Patient is able to talk and breathing normally.

## 2023-02-10 NOTE — ED Provider Notes (Signed)
Patient today while eating kind of choked they needed to pad him on the back to get the food out.  Patient in no distress now.  But will get portable chest x-ray and will put a pulse ox on just to monitor in case of aspiration.  Also will switch his diet to soft diet.   Vanetta Mulders, MD 02/10/23 [phone]

## 2023-02-10 NOTE — ED Notes (Signed)
Hayzen Lorenson called to request information on the patient. There is no contact listed by that name. Lucendia Herrlich also gave the name Rolene Arbour. That name is also not listed in patients contacts. Guardian was called and given the names and numbers given

## 2023-02-10 NOTE — ED Notes (Signed)
O730 tylenol held due to therapeutic rest for patient.

## 2023-02-11 DIAGNOSIS — S065X0A Traumatic subdural hemorrhage without loss of consciousness, initial encounter: Secondary | ICD-10-CM | POA: Diagnosis not present

## 2023-02-11 MED ORDER — STERILE WATER FOR INJECTION IJ SOLN
INTRAMUSCULAR | Status: AC
  Administered 2023-02-11: 1 mL via INTRAMUSCULAR
  Filled 2023-02-11: qty 10

## 2023-02-11 MED ORDER — ZIPRASIDONE MESYLATE 20 MG IM SOLR
10.0000 mg | Freq: Once | INTRAMUSCULAR | Status: AC
  Administered 2023-02-11: 10 mg via INTRAMUSCULAR
  Filled 2023-02-11: qty 20

## 2023-02-11 NOTE — ED Notes (Signed)
Placing mittens on pt at this time to prevent him for destroying property or harming self.

## 2023-02-11 NOTE — ED Notes (Signed)
Pt continues to exhibit rattling cough, but seems unable to effectively clear throat.
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
## 2023-02-11 NOTE — ED Notes (Signed)
Pt behavior is escalating. Yelling, kicking out at sitter, cursing. MD made aware.

## 2023-02-11 NOTE — ED Provider Notes (Signed)
Emergency Medicine Observation Re-evaluation Note  [name] is a 89 y.o. male, seen on rounds today.  Pt initially presented to the ED for complaints of Aggressive Behavior Currently, the patient is sleeping comfortably.  Physical Exam  BP 115/74 (BP Location: Left Arm)   Pulse 100   Temp 98.4 F (36.9 C) (Oral)   Resp 19   Ht 5\' 8"  (1.727 m)   Wt 75.2 kg   SpO2 96%   BMI 25.21 kg/m  Physical Exam General: no acute distress Cardiac: regular rate Lungs: equal chest rise Psych: calm  ED Course / MDM  EKG:EKG Interpretation Date/Time:  Friday February 09 2023 19:54:32 EDT Ventricular Rate:  89 PR Interval:    QRS Duration:  82 QT Interval:  364 QTC Calculation: 442 R Axis:   204  Text Interpretation: Atrial fibrillation Right superior axis deviation Possible Right ventricular hypertrophy Cannot rule out Anteroseptal infarct , age undetermined Abnormal ECG When compared with ECG of 06-Feb-2023 19:44, No significant change was found Confirmed by Dione Booze ([id number]) on 02/10/2023 12:28:50 AM  I have reviewed the labs performed to date as well as medications administered while in observation.  Recent changes in the last 24 hours include had choking episode, CXR was performed without aspiration. Hasn't been hypoxic.  Plan  Current plan is for placement.    Lonell Grandchild, MD 02/11/23 [phone]
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
## 2023-02-12 ENCOUNTER — Telehealth (HOSPITAL_BASED_OUTPATIENT_CLINIC_OR_DEPARTMENT_OTHER): Payer: Self-pay | Admitting: *Deleted

## 2023-02-12 DIAGNOSIS — S065X0A Traumatic subdural hemorrhage without loss of consciousness, initial encounter: Secondary | ICD-10-CM | POA: Diagnosis not present

## 2023-02-12 MED ORDER — RIVAROXABAN 15 MG PO TABS
15.0000 mg | ORAL_TABLET | Freq: Every day | ORAL | Status: DC
  Administered 2023-02-12 – 2023-02-27 (×13): 15 mg via ORAL
  Filled 2023-02-12 (×13): qty 1

## 2023-02-12 NOTE — Progress Notes (Signed)
Physical Therapy Treatment Patient Details Name: [name] MRN: [id number] DOB: 1930/01/17 Today's Date: 02/12/2023   History of Present Illness [name] is a 89 y.o. male.     Patient with history of A-fib on Xarelto, advanced dementia presents today from [facility] with complaints of fall. Patient with advanced dementia alert and oriented x 0 at baseline. Therefore, history provided by facility and EMS personnel. Per nursing staff, patient normally ambulates with a rollator at baseline and sits outside to eat his lunch. He was doing so earlier today and slid out of his rollator and hit the ground. Unsure if he hit his head or not, patient would not walk with nursing staff and therefore was transported here for evaluation. Upon arrival here, patient walking throughout the hallways is agitated saying he wants to leave, denies ever falling today. Denies any injuries or complaints. Discussed with nursing home staff, this is his baseline.    PT Comments  Patient presents slightly lethargic and able to participate with therapy.  Patient demonstrates fair return for completing BLE exercises while seated at EOB with verbal cueing, limited to a few side steps before having to sit due to loss of balance and required Max assist to reposition when put back to bed.  Patient will benefit from continued skilled physical therapy in hospital and recommended venue below to increase strength, balance, endurance for safe ADLs and gait.     If plan is discharge home, recommend the following: A lot of help with walking and/or transfers;A lot of help with bathing/dressing/bathroom;Assistance with cooking/housework;Help with stairs or ramp for entrance   Can travel by private vehicle     No  Equipment Recommendations  None recommended by PT    Recommendations for Other Services       Precautions / Restrictions Precautions Precautions: Fall Restrictions Weight Bearing  Restrictions: No (Unable to bear weight independently; 2+ assist)     Mobility  Bed Mobility Overal bed mobility: Needs Assistance Bed Mobility: Supine to Sit, Sit to Supine     Supine to sit: Mod assist Sit to supine: Mod assist   General bed mobility comments: slow labored movement    Transfers Overall transfer level: Needs assistance Equipment used: Rolling walker (2 wheels) Transfers: Sit to/from Stand Sit to Stand: Mod assist           General transfer comment: unsteady labored movement    Ambulation/Gait Ambulation/Gait assistance: Max assist Gait Distance (Feet): 3 Feet Assistive device: Rolling walker (2 wheels) Gait Pattern/deviations: Decreased step length - right, Decreased step length - left, Decreased stance time - right, Decreased stance time - left, Decreased stride length, Shuffle Gait velocity: slow     General Gait Details: limited to a few slow labored side steps before having to sit due to loss of balance   Stairs             Wheelchair Mobility     Tilt Bed    Modified Rankin (Stroke Patients Only)       Balance Overall balance assessment: Needs assistance Sitting-balance support: Feet supported, Bilateral upper extremity supported Sitting balance-Leahy Scale: Poor Sitting balance - Comments: fair/poor seated at EOB   Standing balance support: Reliant on assistive device for balance, During functional activity, Bilateral upper extremity supported Standing balance-Leahy Scale: Poor Standing balance comment: frequent buckling of knees, unable to step away from bedside due to fall risk  Cognition Arousal/Alertness: Awake/alert, Lethargic Behavior During Therapy: Anxious Overall Cognitive Status: History of cognitive impairments - at baseline                                          Exercises General Exercises - Lower Extremity Ankle Circles/Pumps: AROM, Both, 10 reps,  Seated, Strengthening Long Arc Quad: AROM, Strengthening, Both, 10 reps, AAROM, Seated Hip Flexion/Marching: AROM, Strengthening, Both, 10 reps, Seated    General Comments        Pertinent Vitals/Pain Pain Assessment Pain Assessment: Faces Pain Score: 0-No pain    Home Living                          Prior Function            PT Goals (current goals can now be found in the care plan section) Acute Rehab PT Goals Patient Stated Goal: return home PT Goal Formulation: With patient Time For Goal Achievement: 02/21/23 Potential to Achieve Goals: Good Progress towards PT goals: Progressing toward goals    Frequency    Min 2X/week      PT Plan Current plan remains appropriate    Co-evaluation              AM-PAC PT "6 Clicks" Mobility   Outcome Measure  Help needed turning from your back to your side while in a flat bed without using bedrails?: A Lot Help needed moving from lying on your back to sitting on the side of a flat bed without using bedrails?: A Lot Help needed moving to and from a bed to a chair (including a wheelchair)?: A Lot Help needed standing up from a chair using your arms (e.g., wheelchair or bedside chair)?: A Lot Help needed to walk in hospital room?: A Lot Help needed climbing 3-5 steps with a railing? : Total 6 Click Score: 11    End of Session Equipment Utilized During Treatment: Gait belt Activity Tolerance: Patient tolerated treatment well;Patient limited by fatigue;Patient limited by lethargy Patient left: in bed;with call bell/phone within reach;with nursing/sitter in room Nurse Communication: Mobility status PT Visit Diagnosis: Unsteadiness on feet (R26.81);Other abnormalities of gait and mobility (R26.89);Muscle weakness (generalized) (M62.81)     Time: 1610-9604 PT Time Calculation (min) (ACUTE ONLY): 26 min  Charges:    $Therapeutic Exercise: 8-22 mins $Therapeutic Activity: 8-22 mins PT General Charges $$  ACUTE PT VISIT: 1 Visit                     1:49 PM, 02/12/23 Ocie Bob, MPT Physical Therapist with [facility] [phone] office [phone] mobile phone

## 2023-02-12 NOTE — ED Notes (Signed)
Pt sleeping unable to medicate at this time

## 2023-02-12 NOTE — ED Notes (Signed)
Rounding pt is sleeping at this time

## 2023-02-12 NOTE — Progress Notes (Signed)
Reviewed pt's record. At this time, pt does not have any SNF bed offers. Will follow up with pending SNFs once pt's behavioral concerns are addressed.

## 2023-02-12 NOTE — ED Notes (Signed)
Rounding Eyes on pt Pt sleeping, sitter outside room NAD

## 2023-02-12 NOTE — ED Provider Notes (Addendum)
Emergency Medicine Observation Re-evaluation Note  [name] is a 89 y.o. male, seen on rounds today.  Pt initially presented to the ED for complaints of Aggressive Behavior Currently, the patient is resting comfortably..  Physical Exam  BP (!) 85/61 (BP Location: Right Arm)   Pulse 76   Temp 98.7 F (37.1 C) (Oral)   Resp 20   Ht 5\' 8"  (1.727 m)   Wt 75.2 kg   SpO2 94%   BMI 25.21 kg/m  Physical Exam General: Resting comfortably in stretcher Lungs: Normal work of breathing Psych: Calm  ED Course / MDM  EKG:EKG Interpretation Date/Time:  Friday February 09 2023 19:54:32 EDT Ventricular Rate:  89 PR Interval:    QRS Duration:  82 QT Interval:  364 QTC Calculation: 442 R Axis:   204  Text Interpretation: Atrial fibrillation Right superior axis deviation Possible Right ventricular hypertrophy Cannot rule out Anteroseptal infarct , age undetermined Abnormal ECG When compared with ECG of 06-Feb-2023 19:44, No significant change was found Confirmed by [name] ([id number]) on 02/10/2023 12:28:50 AM  I have reviewed the labs performed to date as well as medications administered while in observation.  Recent changes in the last 24 hours include did get agitated last night.  Plan  Current plan is for placement.  Patient occasionally having some difficulty swallowing his food.  SLP consult placed.  Diet has already been changed for the patient.  He did have a chest x-ray for this as well which did not show any signs of aspiration pneumonia.  Will resume xarelto today per recommendations of NSGY on 02/05/23.     Adrian Baton, MD 02/12/23 [phone]

## 2023-02-12 NOTE — Telephone Encounter (Signed)
Post ED Visit - Positive Culture Follow-up  Culture report reviewed by antimicrobial stewardship pharmacist: [facility] [x]  Daylene Posey, Pharm.D. []  Celedonio Miyamoto, Pharm.D., BCPS AQ-ID []  Garvin Fila, Pharm.D., BCPS []  Georgina Pillion, [address].D., BCPS []  [place], [address].D., BCPS, AAHIVP []  Estella Husk, Pharm.D., BCPS, AAHIVP []  Lysle Pearl, PharmD, BCPS []  Phillips Climes, PharmD, BCPS []  Agapito Games, PharmD, BCPS []  Verlan Friends, PharmD []  Mervyn Gay, PharmD, BCPS []  Vinnie Level, PharmD  [facility] []  Len Childs, PharmD []  Greer Pickerel, PharmD []  Adalberto Cole, PharmD []  Perlie Gold, Rph []  Lonell Face) Jean Rosenthal, PharmD []  Earl Many, PharmD []  Junita Push, PharmD []  Dorna Leitz, PharmD []  Terrilee Files, PharmD []  Lynann Beaver, PharmD []  Keturah Barre, PharmD []  Loralee Pacas, PharmD []  Bernadene Person, PharmD   Positive blood culture Currently patient in [facility] and results called to [facility] staff. No further patient follow-up is required at this time.  [name] 02/12/2023, 9:01 AM

## 2023-02-12 NOTE — ED Notes (Signed)
Mitts removed  Medicated pt per mar Assessed VS Pt speaking in full sentences Assisted with eating lunch Brief checked and pt was dry Pt did not want to get out of bed Pt was calm, cooperative and prayed

## 2023-02-13 DIAGNOSIS — S065X0A Traumatic subdural hemorrhage without loss of consciousness, initial encounter: Secondary | ICD-10-CM | POA: Diagnosis not present

## 2023-02-13 NOTE — ED Notes (Addendum)
Patient awake at this time. Respirations even and unlabored. VSS. Non-toxic in appearing. Patient provided with a warm blanket. Patient denies any concerns or questions at this time. Patient refused medications. This RN explained importance of medications to patient. Patient states, "that's very beautiful, maybe later"

## 2023-02-13 NOTE — ED Notes (Signed)
Charge RN Maci stated she had put the order in for a telesitter but the camera was never brought down due to [facility] stated pt would need to be sitter free before being accepted to a facility.  Pt has been calm and cooperative all night and hourly roundings have been performed

## 2023-02-13 NOTE — ED Notes (Signed)
Breakfast tray provided for patient. Patient remains lying on stretcher with eyes closed. Respirations even and unlabored.

## 2023-02-13 NOTE — ED Notes (Signed)
CSW spoke to [place] with [facility] who states that pt needs to have calm behaviors for a couple more days before they will be able to come out and assess for possible placement in their facility. TOC to follow.

## 2023-02-13 NOTE — ED Provider Notes (Signed)
Emergency Medicine Observation Re-evaluation Note  [name] is a 89 y.o. male, seen on rounds today.  Pt initially presented to the ED for complaints of Aggressive Behavior Currently, the patient is resting comfortably.  Physical Exam  BP 131/69 (BP Location: Left Arm)   Pulse 90   Temp 98.1 F (36.7 C) (Oral)   Resp 17   Ht 5\' 8"  (1.727 m)   Wt 75.2 kg   SpO2 96%   BMI 25.21 kg/m  Physical Exam Vitals and nursing note reviewed.  Constitutional:      General: He is not in acute distress.    Appearance: He is well-developed.  HENT:     Head: Normocephalic and atraumatic.  Eyes:     Conjunctiva/sclera: Conjunctivae normal.  Cardiovascular:     Rate and Rhythm: Normal rate and regular rhythm.     Heart sounds: No murmur heard. Pulmonary:     Effort: Pulmonary effort is normal. No respiratory distress.  Musculoskeletal:        General: No swelling.     Cervical back: Neck supple.  Skin:    General: Skin is warm and dry.  Neurological:     Mental Status: He is alert.  Psychiatric:        Mood and Affect: Mood normal.      ED Course / MDM  EKG:EKG Interpretation Date/Time:  Friday February 09 2023 19:54:32 EDT Ventricular Rate:  89 PR Interval:    QRS Duration:  82 QT Interval:  364 QTC Calculation: 442 R Axis:   204  Text Interpretation: Atrial fibrillation Right superior axis deviation Possible Right ventricular hypertrophy Cannot rule out Anteroseptal infarct , age undetermined Abnormal ECG When compared with ECG of 06-Feb-2023 19:44, No significant change was found Confirmed by Dione Booze ([id number]) on 02/10/2023 12:28:50 AM  I have reviewed the labs performed to date as well as medications administered while in observation.  Recent changes in the last 24 hours include none, still awaiting SNF placement.  Plan  Current plan is for SNF placement.    Glendora Score, MD 02/13/23 [phone]

## 2023-02-13 NOTE — ED Notes (Signed)
Pt's brief and chucks changed.  No obvious redness or breakdown on bottom.  Pt repositioned and comfortable in bed and denies needs at this time.

## 2023-02-13 NOTE — ED Notes (Signed)
This RN assumed care of patient at 0700. Patient is lying on stretcher with eyes closed. Respirations even and unlabored. NAD.

## 2023-02-13 NOTE — Evaluation (Signed)
Clinical/Bedside Swallow Evaluation Patient Details  Name: [name] MRN: [id number] Date of Birth: 1929/08/17  Today's Date: 02/13/2023 Time: SLP Start Time (ACUTE ONLY): 1305 SLP Stop Time (ACUTE ONLY): 1329 SLP Time Calculation (min) (ACUTE ONLY): 24 min  Past Medical History:  Past Medical History:  Diagnosis Date   [name] deficiency 2011   Bladder cancer (HCC)    Malignet Neoplasm   CAD (coronary artery disease)    Cerebral infarction (HCC)    Dementia (HCC)    Hx of colonic polyp    Hyperlipidemia    Memory loss 2011   Unspecified atrial fibrillation (HCC)    Past Surgical History:  Past Surgical History:  Procedure Laterality Date   CHOLECYSTECTOMY     HPI:  [name] is a 89 y.o. male.     Patient with history of A-fib on Xarelto, advanced dementia presents today from [facility] with complaints of fall. Patient with advanced dementia alert and oriented x 0 at baseline. Therefore, history provided by facility and EMS personnel. Per nursing staff, patient normally ambulates with a rollator at baseline and sits outside to eat his lunch. He was doing so earlier today and slid out of his rollator and hit the ground. Unsure if he hit his head or not, patient would not walk with nursing staff and therefore was transported here for evaluation. Upon arrival here, patient walking throughout the hallways is agitated saying he wants to leave, denies ever falling today. Denies any injuries or complaints. Discussed with nursing home staff, this is his baseline.    Assessment / Plan / Recommendation  Clinical Impression  Pt seen for clinical swallow evaluation in ED room. Pt is confused by very pleasant and participated in all tasks. He was unable to generate a volitional cough due to cognitive deficits. He initially sounded wet and congested, however this cleared once he started talking a bit more. Pt consumed several ice chips and he expressed how  "wonderful" they were. He was able to self present cup sips of thin water with SLP hand guidance and no overt signs of aspiration. Pt did present with occasional coughing when taking thin liquids by straw. He was given small pieces of deli [place] and small bites of sandwich bread and he masticated for a bit and then expectorated in the same form as when presented. Pt without overt signs of reduced airway protection with cup sips of NTL, however he did express displeasure to drinking it. Ok to continue D2 and advance to thin liquids with NO STRAWS and encouraged small cup sips, crush medication as able and present in puree. Recommend f/u dysphagia services at next venue of care to ensure diet tolerance.   SLP Visit Diagnosis: Dysphagia, unspecified (R13.10)    Aspiration Risk  Mild aspiration risk    Diet Recommendation Dysphagia 1 (Puree);Thin liquid  D2/chopped  Liquid Administration via: Cup;No straw Medication Administration: Crushed with puree Supervision: Staff to assist with self feeding;Full supervision/cueing for compensatory strategies Compensations: Slow rate;Small sips/bites Postural Changes: Seated upright at 90 degrees;Remain upright for at least 30 minutes after po intake    Other  Recommendations Oral Care Recommendations: Oral care BID;Staff/trained caregiver to provide oral care    Recommendations for follow up therapy are one component of a multi-disciplinary discharge planning process, led by the attending physician.  Recommendations may be updated based on patient status, additional functional criteria and insurance authorization.  Follow up Recommendations Skilled nursing-short term rehab (<3 hours/day)  Assistance Recommended at Discharge    Functional Status Assessment Patient has had a recent decline in their functional status and demonstrates the ability to make significant improvements in function in a reasonable and predictable amount of time.  Frequency and  Duration min 2x/week  1 week       Prognosis Prognosis for improved oropharyngeal function: Fair Barriers to Reach Goals: Cognitive deficits      Swallow Study   General Date of Onset: 02/06/23 HPI: [name] is a 89 y.o. male.     Patient with history of A-fib on Xarelto, advanced dementia presents today from [facility] with complaints of fall. Patient with advanced dementia alert and oriented x 0 at baseline. Therefore, history provided by facility and EMS personnel. Per nursing staff, patient normally ambulates with a rollator at baseline and sits outside to eat his lunch. He was doing so earlier today and slid out of his rollator and hit the ground. Unsure if he hit his head or not, patient would not walk with nursing staff and therefore was transported here for evaluation. Upon arrival here, patient walking throughout the hallways is agitated saying he wants to leave, denies ever falling today. Denies any injuries or complaints. Discussed with nursing home staff, this is his baseline. Type of Study: Bedside Swallow Evaluation Diet Prior to this Study: Dysphagia 1 (pureed);Mildly thick liquids (Level 2, nectar thick) Temperature Spikes Noted: No Respiratory Status: Room air History of Recent Intubation: No Behavior/Cognition: Alert;Cooperative;Pleasant mood;Requires cueing Oral Cavity Assessment: Within Functional Limits Oral Care Completed by SLP: Yes Oral Cavity - Dentition: Dentures, top Vision: Functional for self-feeding Self-Feeding Abilities: Able to feed self;Needs assist Patient Positioning: Upright in bed Baseline Vocal Quality: Normal;Wet Volitional Cough: Cognitively unable to elicit Volitional Swallow: Able to elicit    Oral/Motor/Sensory Function Overall Oral Motor/Sensory Function: Within functional limits   Ice Chips Ice chips: Within functional limits   Thin Liquid Thin Liquid: Impaired Presentation: Cup;Self Fed;Straw Pharyngeal   Phase Impairments: Cough - Immediate (with straw sips x1)    Nectar Thick Nectar Thick Liquid: Within functional limits Presentation: Cup   Honey Thick Honey Thick Liquid: Not tested   Puree Puree: Within functional limits Presentation: Spoon   Solid     Solid: Impaired Oral Phase Impairments: Impaired mastication Oral Phase Functional Implications: Impaired mastication;Oral residue     Thank you,  Havery Moros, CCC-SLP [phone]  , 02/13/2023,3:42 PM

## 2023-02-13 NOTE — ED Notes (Signed)
Changed pt due to BM and urine. Full bed change. Pt is resting in the bed at this time.

## 2023-02-13 NOTE — ED Notes (Signed)
Patient remains lying on stretcher with eyes closed. Respirations even and unlabored. NAD.

## 2023-02-14 ENCOUNTER — Emergency Department (HOSPITAL_COMMUNITY): Payer: Medicare HMO

## 2023-02-14 DIAGNOSIS — K862 Cyst of pancreas: Secondary | ICD-10-CM | POA: Diagnosis not present

## 2023-02-14 DIAGNOSIS — S065X0A Traumatic subdural hemorrhage without loss of consciousness, initial encounter: Secondary | ICD-10-CM | POA: Diagnosis not present

## 2023-02-14 DIAGNOSIS — R059 Cough, unspecified: Secondary | ICD-10-CM | POA: Diagnosis not present

## 2023-02-14 DIAGNOSIS — R935 Abnormal findings on diagnostic imaging of other abdominal regions, including retroperitoneum: Secondary | ICD-10-CM | POA: Diagnosis not present

## 2023-02-14 DIAGNOSIS — K769 Liver disease, unspecified: Secondary | ICD-10-CM | POA: Diagnosis not present

## 2023-02-14 DIAGNOSIS — I700 Atherosclerosis of aorta: Secondary | ICD-10-CM | POA: Diagnosis not present

## 2023-02-14 LAB — COMPREHENSIVE METABOLIC PANEL
ALT: 10 U/L (ref 0–44)
AST: 20 U/L (ref 15–41)
Albumin: 3.5 g/dL (ref 3.5–5.0)
Alkaline Phosphatase: 107 U/L (ref 38–126)
Anion gap: 17 — ABNORMAL HIGH (ref 5–15)
BUN: 37 mg/dL — ABNORMAL HIGH (ref 8–23)
CO2: 20 mmol/L — ABNORMAL LOW (ref 22–32)
Calcium: 9 mg/dL (ref 8.9–10.3)
Chloride: 104 mmol/L (ref 98–111)
Creatinine, Ser: 1.08 mg/dL (ref 0.61–1.24)
GFR, Estimated: >60 mL/min (ref >60)
Glucose, Bld: 150 mg/dL — ABNORMAL HIGH (ref 70–99)
Potassium: 4.6 mmol/L (ref 3.5–5.1)
Sodium: 141 mmol/L (ref 135–145)
Total Bilirubin: 2.5 mg/dL — ABNORMAL HIGH (ref 0.3–1.2)
Total Protein: 7.3 g/dL (ref 6.5–8.1)

## 2023-02-14 LAB — CBC
HCT: 42 % (ref 39.0–52.0)
Hemoglobin: 14.3 g/dL (ref 13.0–17.0)
MCH: 32.8 pg (ref 26.0–34.0)
MCHC: 34 g/dL (ref 30.0–36.0)
MCV: 96.3 fL (ref 80.0–100.0)
Platelets: 208 10*3/uL (ref 150–400)
RBC: 4.36 MIL/uL (ref 4.22–5.81)
RDW: 12.5 % (ref 11.5–15.5)
WBC: 20 10*3/uL — ABNORMAL HIGH (ref 4.0–10.5)
nRBC: 0 % (ref 0.0–0.2)

## 2023-02-14 LAB — LIPASE, BLOOD: Lipase: 23 U/L (ref 11–51)

## 2023-02-14 LAB — URINALYSIS, ROUTINE W REFLEX MICROSCOPIC
Bacteria, UA: NONE SEEN
Glucose, UA: NEGATIVE mg/dL
Hgb urine dipstick: NEGATIVE
Ketones, ur: 20 mg/dL — AB
Leukocytes,Ua: NEGATIVE
Nitrite: NEGATIVE
Protein, ur: 30 mg/dL — AB
Specific Gravity, Urine: 1.027 (ref 1.005–1.030)
pH: 5 (ref 5.0–8.0)

## 2023-02-14 LAB — RESP PANEL BY RT-PCR (RSV, FLU A&B, COVID)  RVPGX2
Influenza A by PCR: NEGATIVE
Influenza B by PCR: NEGATIVE
Resp Syncytial Virus by PCR: NEGATIVE
SARS Coronavirus 2 by RT PCR: NEGATIVE

## 2023-02-14 MED ORDER — IOHEXOL 300 MG/ML  SOLN
80.0000 mL | Freq: Once | INTRAMUSCULAR | Status: AC | PRN
  Administered 2023-02-14: 80 mL via INTRAVENOUS

## 2023-02-14 MED ORDER — POLYETHYLENE GLYCOL 3350 17 G PO PACK
17.0000 g | PACK | Freq: Two times a day (BID) | ORAL | Status: DC
  Administered 2023-02-15 – 2023-02-23 (×9): 17 g via ORAL
  Administered 2023-02-24: 8 g via ORAL
  Administered 2023-02-25 – 2023-02-27 (×3): 17 g via ORAL
  Filled 2023-02-14 (×17): qty 1

## 2023-02-14 NOTE — ED Notes (Signed)
Diet Recommendation Dysphagia 1 (Puree);Thin liquid  D2/chopped   Liquid Administration via: Cup;No straw Medication Administration: Crushed with puree Supervision: Staff to assist with self feeding;Full supervision/cueing for compensatory strategies Compensations: Slow rate;Small sips/bites Postural Changes: Seated upright at 90 degrees;Remain upright for at least 30 minutes after po intake     This note was copied and pasted from speech evaluation in EMR

## 2023-02-14 NOTE — ED Notes (Signed)
Changed pt for the third time with loose stool. RN notified. Pt has clean brief and resting comfortably at this time.

## 2023-02-14 NOTE — ED Notes (Signed)
Pt restign/sleeping. Chest rise and fall noted.

## 2023-02-14 NOTE — ED Notes (Signed)
Pt became aggravated when tried to give tylenol. Did not take it. Busy apron given due to pt fidgity with hands

## 2023-02-14 NOTE — ED Notes (Signed)
Pt sleeping.  Chest rise and fall noted.

## 2023-02-14 NOTE — ED Notes (Signed)
Pt took one sip of water mixed with Miralax and would not drink anymore.

## 2023-02-14 NOTE — ED Provider Notes (Addendum)
Emergency Medicine Observation Re-evaluation Note  [name] is a 89 y.o. male, seen on rounds today.  Pt initially presented to the ED for complaints of Aggressive Behavior Currently, the patient is resting comfortably.  Physical Exam  BP (!) 198/91 (BP Location: Left Arm)   Pulse 96   Temp 98.7 F (37.1 C) (Oral)   Resp 14   Ht 5\' 8"  (1.727 m)   Wt 75.2 kg   SpO2 96%   BMI 25.21 kg/m  Physical Exam Vitals and nursing note reviewed.  Constitutional:      General: He is not in acute distress.    Appearance: He is well-developed.  HENT:     Head: Normocephalic and atraumatic.  Eyes:     Conjunctiva/sclera: Conjunctivae normal.  Cardiovascular:     Rate and Rhythm: Normal rate and regular rhythm.     Heart sounds: No murmur heard. Pulmonary:     Effort: Pulmonary effort is normal. No respiratory distress.  Musculoskeletal:        General: No swelling.     Cervical back: Neck supple.  Skin:    General: Skin is warm and dry.  Neurological:     Mental Status: He is alert.  Psychiatric:        Mood and Affect: Mood normal.      ED Course / MDM  EKG:EKG Interpretation Date/Time:  Friday February 09 2023 19:54:32 EDT Ventricular Rate:  89 PR Interval:    QRS Duration:  82 QT Interval:  364 QTC Calculation: 442 R Axis:   204  Text Interpretation: Atrial fibrillation Right superior axis deviation Possible Right ventricular hypertrophy Cannot rule out Anteroseptal infarct , age undetermined Abnormal ECG When compared with ECG of 06-Feb-2023 19:44, No significant change was found Confirmed by Dione Booze ([id number]) on 02/10/2023 12:28:50 AM  I have reviewed the labs performed to date as well as medications administered while in observation.  Recent changes in the last 24 hours include significant leukocytosis noted on repeat labs.  Repeat chest x-ray, CT abdomen pelvis and urinalysis without evidence of infection.  CT does show large stool ball and MiraLAX ordered.   Suspect leukocytosis from frequent Geodon administration subsequent cortisol release from agitation.  TOC still working on placement  Plan  Current plan is for placement.    Glendora Score, MD 02/14/23 2057    Glendora Score, MD 02/14/23 [phone]

## 2023-02-14 NOTE — ED Notes (Signed)
Pt back from ct . Nad. No changes.

## 2023-02-14 NOTE — ED Notes (Signed)
Pt has been changed due to another loose BM. Pt also threw up. Pt's bed and himself has been cleaned up. Pt resting in the bed at this time.

## 2023-02-14 NOTE — ED Notes (Signed)
Pt had 1 episode of emesis. Dr. Durwin Nora made aware, awaiting orders

## 2023-02-14 NOTE — ED Notes (Signed)
Pt lying in bed, listening and watching BH pt in restraints scream across hall

## 2023-02-14 NOTE — ED Notes (Signed)
Pt sleeping, will not wake pt up to give enema at this time- will report pt needs this in the am upon care handoff to next RN. Wilkie Aye, Geophysicist/field seismologist nurse aware.

## 2023-02-15 DIAGNOSIS — S065X0A Traumatic subdural hemorrhage without loss of consciousness, initial encounter: Secondary | ICD-10-CM | POA: Diagnosis not present

## 2023-02-15 LAB — CBC WITH DIFFERENTIAL/PLATELET
Abs Immature Granulocytes: 0.04 10*3/uL (ref 0.00–0.07)
Basophils Absolute: 0 10*3/uL (ref 0.0–0.1)
Basophils Relative: 0 %
Eosinophils Absolute: 0.1 10*3/uL (ref 0.0–0.5)
Eosinophils Relative: 1 %
HCT: 40.3 % (ref 39.0–52.0)
Hemoglobin: 13.9 g/dL (ref 13.0–17.0)
Immature Granulocytes: 0 %
Lymphocytes Relative: 7 %
Lymphs Abs: 0.8 10*3/uL (ref 0.7–4.0)
MCH: 33.2 pg (ref 26.0–34.0)
MCHC: 34.5 g/dL (ref 30.0–36.0)
MCV: 96.2 fL (ref 80.0–100.0)
Monocytes Absolute: 1.3 10*3/uL — ABNORMAL HIGH (ref 0.1–1.0)
Monocytes Relative: 10 %
Neutro Abs: 10.1 10*3/uL — ABNORMAL HIGH (ref 1.7–7.7)
Neutrophils Relative %: 82 %
Platelets: 172 10*3/uL (ref 150–400)
RBC: 4.19 MIL/uL — ABNORMAL LOW (ref 4.22–5.81)
RDW: 12.8 % (ref 11.5–15.5)
WBC: 12.4 10*3/uL — ABNORMAL HIGH (ref 4.0–10.5)
nRBC: 0 % (ref 0.0–0.2)

## 2023-02-15 LAB — BASIC METABOLIC PANEL
Anion gap: 11 (ref 5–15)
BUN: 52 mg/dL — ABNORMAL HIGH (ref 8–23)
CO2: 27 mmol/L (ref 22–32)
Calcium: 9.1 mg/dL (ref 8.9–10.3)
Chloride: 104 mmol/L (ref 98–111)
Creatinine, Ser: 1.2 mg/dL (ref 0.61–1.24)
GFR, Estimated: 57 mL/min — ABNORMAL LOW (ref >60)
Glucose, Bld: 130 mg/dL — ABNORMAL HIGH (ref 70–99)
Potassium: 3.6 mmol/L (ref 3.5–5.1)
Sodium: 142 mmol/L (ref 135–145)

## 2023-02-15 NOTE — ED Notes (Signed)
Pt refuses to eat lunch after attempting to assist with meals.  Offered some milk, pt swatted at cup.

## 2023-02-15 NOTE — ED Notes (Signed)
Pt had a very large bowel movement and took his diaper off himself and scooted it down to the end of the bed. Pt is now cleaned and changed. Pt was very agitated while being changed and threw a couple of punches. Pt is now settled and back asleep.

## 2023-02-15 NOTE — ED Notes (Addendum)
Per PT, pt was able to ambulate with a walker to outside the room door/several feet and back. Also per PT, pt was not combative or aggressive during their interaction

## 2023-02-15 NOTE — ED Provider Notes (Signed)
Emergency Medicine Observation Re-evaluation Note  [name] is a 89 y.o. male, seen on rounds today.  Pt initially presented to the ED for complaints of Aggressive Behavior Currently, the patient is asleep.  Physical Exam  BP (!) 198/91 (BP Location: Left Arm)   Pulse 96   Temp 98.7 F (37.1 C) (Oral)   Resp 14   Ht 5\' 8"  (1.727 m)   Wt 75.2 kg   SpO2 96%   BMI 25.21 kg/m  Physical Exam General: asleep Cardiac: asleep Lungs: asleep Psych: asleep  ED Course / MDM  EKG:EKG Interpretation Date/Time:  Friday February 09 2023 19:54:32 EDT Ventricular Rate:  89 PR Interval:    QRS Duration:  82 QT Interval:  364 QTC Calculation: 442 R Axis:   204  Text Interpretation: Atrial fibrillation Right superior axis deviation Possible Right ventricular hypertrophy Cannot rule out Anteroseptal infarct , age undetermined Abnormal ECG When compared with ECG of 06-Feb-2023 19:44, No significant change was found Confirmed by Dione Booze ([id number]) on 02/10/2023 12:28:50 AM  I have reviewed the labs performed to date as well as medications administered while in observation.  Recent changes in the last 24 hours include medications for agitation.  Plan  Current plan is for placement. Of note he had a large BM this AM. His leukocytosis could be from agitation/cortisol surge, but will recheck this AM. No clear signs of infection at this time.    Pricilla Loveless, MD 02/15/23 [phone]

## 2023-02-15 NOTE — Progress Notes (Signed)
Physical Therapy Treatment Patient Details Name: [name] MRN: [id number] DOB: 1929-10-28 Today's Date: 02/15/2023   History of Present Illness [name] is a 89 y.o. male.     Patient with history of A-fib on Xarelto, advanced dementia presents today from [facility] facility with complaints of fall. Patient with advanced dementia alert and oriented x 0 at baseline. Therefore, history provided by facility and EMS personnel. Per nursing staff, patient normally ambulates with a rollator at baseline and sits outside to eat his lunch. He was doing so earlier today and slid out of his rollator and hit the ground. Unsure if he hit his head or not, patient would not walk with nursing staff and therefore was transported here for evaluation. Upon arrival here, patient walking throughout the hallways is agitated saying he wants to leave, denies ever falling today. Denies any injuries or complaints. Discussed with nursing home staff, this is his baseline.    PT Comments  Patient agreeable for therapy requiring repeated verbal/tactile cueing for following directions.  Patient demonstrates slow labored movement for sitting up at bedside, fair carryover for completing BLE exercises while seated in chair, increased endurance/distance for gait training with slow labored movement and most difficulty making turns due to scissoring of legs.  Patient tolerated sitting up in chair after therapy - RN notified.  Patient will benefit from continued skilled physical therapy in hospital and recommended venue below to increase strength, balance, endurance for safe ADLs and gait.       If plan is discharge home, recommend the following: A lot of help with walking and/or transfers;A lot of help with bathing/dressing/bathroom;Assistance with cooking/housework;Help with stairs or ramp for entrance   Can travel by private vehicle     No  Equipment Recommendations  None recommended by PT     Recommendations for Other Services       Precautions / Restrictions Precautions Precautions: Fall Restrictions Weight Bearing Restrictions: No     Mobility  Bed Mobility Overal bed mobility: Needs Assistance Bed Mobility: Supine to Sit     Supine to sit: Mod assist, Max assist     General bed mobility comments: increased time, labored movement    Transfers Overall transfer level: Needs assistance Equipment used: Rolling walker (2 wheels) Transfers: Sit to/from Stand, Bed to chair/wheelchair/BSC Sit to Stand: Mod assist   Step pivot transfers: Mod assist       General transfer comment: slow labored movement with frequent verbal/tactile cueing for safety    Ambulation/Gait Ambulation/Gait assistance: Mod assist Gait Distance (Feet): 25 Feet Assistive device: Rolling walker (2 wheels) Gait Pattern/deviations: Decreased step length - right, Decreased step length - left, Decreased stride length, Scissoring Gait velocity: slow     General Gait Details: slow labored unsteady cadence with most difficulty making turns due to scissoring of legs   Stairs             Wheelchair Mobility     Tilt Bed    Modified Rankin (Stroke Patients Only)       Balance Overall balance assessment: Needs assistance Sitting-balance support: Feet unsupported, Bilateral upper extremity supported Sitting balance-Leahy Scale: Fair Sitting balance - Comments: seated at EOB   Standing balance support: Reliant on assistive device for balance, During functional activity, Bilateral upper extremity supported Standing balance-Leahy Scale: Poor Standing balance comment: fair/poor using RW  Cognition Arousal: Alert, Obtunded Behavior During Therapy: WFL for tasks assessed/performed Overall Cognitive Status: History of cognitive impairments - at baseline                                          Exercises General Exercises  - Lower Extremity Ankle Circles/Pumps: AROM, Both, 10 reps, Seated, Strengthening Long Arc Quad: AROM, Strengthening, Both, 10 reps, AAROM, Seated Hip Flexion/Marching: AROM, Strengthening, Both, 10 reps, Seated    General Comments        Pertinent Vitals/Pain Pain Assessment Pain Assessment: Faces Faces Pain Scale: Hurts even more Pain Location: low back Pain Descriptors / Indicators: Discomfort, Sore Pain Intervention(s): Limited activity within patient's tolerance, Monitored during session, Repositioned    Home Living                          Prior Function            PT Goals (current goals can now be found in the care plan section) Acute Rehab PT Goals Patient Stated Goal: return home PT Goal Formulation: With patient Time For Goal Achievement: 02/21/23 Potential to Achieve Goals: Good Progress towards PT goals: Progressing toward goals    Frequency    Min 2X/week      PT Plan Current plan remains appropriate    Co-evaluation              AM-PAC PT "6 Clicks" Mobility   Outcome Measure  Help needed turning from your back to your side while in a flat bed without using bedrails?: A Lot Help needed moving from lying on your back to sitting on the side of a flat bed without using bedrails?: A Lot Help needed moving to and from a bed to a chair (including a wheelchair)?: A Lot Help needed standing up from a chair using your arms (e.g., wheelchair or bedside chair)?: A Lot Help needed to walk in hospital room?: A Lot Help needed climbing 3-5 steps with a railing? : A Lot 6 Click Score: 12    End of Session Equipment Utilized During Treatment: Gait belt Activity Tolerance: Patient tolerated treatment well;Patient limited by fatigue Patient left: in chair;with call bell/phone within reach Nurse Communication: Mobility status PT Visit Diagnosis: Unsteadiness on feet (R26.81);Other abnormalities of gait and mobility (R26.89);Muscle weakness  (generalized) (M62.81)     Time: 4098-1191 PT Time Calculation (min) (ACUTE ONLY): 28 min  Charges:    $Gait Training: 8-22 mins $Therapeutic Exercise: 8-22 mins PT General Charges $$ ACUTE PT VISIT: 1 Visit                     12:12 PM, 02/15/23 Ocie Bob, MPT Physical Therapist with [facility] [phone] office [phone] mobile phone

## 2023-02-16 DIAGNOSIS — S065X0A Traumatic subdural hemorrhage without loss of consciousness, initial encounter: Secondary | ICD-10-CM | POA: Diagnosis not present

## 2023-02-16 NOTE — ED Provider Notes (Signed)
Emergency Medicine Observation Re-evaluation Note  [name] is a 89 y.o. male, seen on rounds today.  Pt initially presented to the ED for complaints of Aggressive Behavior Currently, the patient is resting comfortably.  Physical Exam  BP (!) 145/67   Pulse 100   Temp 98.6 F (37 C) (Oral)   Resp 17   Ht 5\' 8"  (1.727 m)   Wt 75.2 kg   SpO2 95%   BMI 25.21 kg/m  Physical Exam Vitals and nursing note reviewed.  Constitutional:      General: He is not in acute distress.    Appearance: He is well-developed.  HENT:     Head: Normocephalic and atraumatic.  Eyes:     Conjunctiva/sclera: Conjunctivae normal.  Cardiovascular:     Rate and Rhythm: Normal rate and regular rhythm.     Heart sounds: No murmur heard. Pulmonary:     Effort: Pulmonary effort is normal. No respiratory distress.  Musculoskeletal:        General: No swelling.     Cervical back: Neck supple.  Skin:    General: Skin is warm and dry.  Neurological:     Mental Status: He is alert.  Psychiatric:        Mood and Affect: Mood normal.      ED Course / MDM  EKG:EKG Interpretation Date/Time:  Friday February 09 2023 19:54:32 EDT Ventricular Rate:  89 PR Interval:    QRS Duration:  82 QT Interval:  364 QTC Calculation: 442 R Axis:   204  Text Interpretation: Atrial fibrillation Right superior axis deviation Possible Right ventricular hypertrophy Cannot rule out Anteroseptal infarct , age undetermined Abnormal ECG When compared with ECG of 06-Feb-2023 19:44, No significant change was found Confirmed by Dione Booze ([id number]) on 02/10/2023 12:28:50 AM  I have reviewed the labs performed to date as well as medications administered while in observation.  Recent changes in the last 24 hours include worked with physical therapy yesterday, still pending placement.  Plan  Current plan is for placement.    Glendora Score, MD 02/16/23 [phone]

## 2023-02-16 NOTE — ED Notes (Signed)
Pt took half of medication. He refused the second bite.

## 2023-02-16 NOTE — ED Notes (Signed)
Pt is beginning to have issues with his swallowing ability. Pt coughs after each sit of water and is unable to swallow his applesauce. Pt ends up spitting put applesauce.

## 2023-02-16 NOTE — ED Notes (Signed)
Pt had bowel movement, this tech and RN changed pt brief and incontinence pad and provided peri care. Pt cooperative with staff while changing.

## 2023-02-16 NOTE — ED Notes (Signed)
[name] with [facility] states that visit went well and she will follow up Monday on if they have a bed available in their memory care unit for pt. TOC to follow.

## 2023-02-16 NOTE — ED Notes (Signed)
Pt did not have enough stool from bowel movement to obtain stool sample

## 2023-02-16 NOTE — ED Notes (Signed)
CSW confirmed with [name] at [facility] that she will be able to come assess pt today. TOC to follow.

## 2023-02-17 DIAGNOSIS — S065X0A Traumatic subdural hemorrhage without loss of consciousness, initial encounter: Secondary | ICD-10-CM | POA: Diagnosis not present

## 2023-02-17 LAB — CBG MONITORING, ED: Glucose-Capillary: 112 mg/dL — ABNORMAL HIGH (ref 70–99)

## 2023-02-17 NOTE — ED Notes (Signed)
Pt difficult to arouse at this time. Not eating. Unable to administer PO meds.

## 2023-02-17 NOTE — ED Notes (Signed)
Fed patient a few bites of food. Was able to chew and swallow without incident.

## 2023-02-17 NOTE — ED Provider Notes (Signed)
Emergency Medicine Observation Re-evaluation Note  [name] is a 89 y.o. male, seen on rounds today.  Pt initially presented to the ED for complaints of Aggressive Behavior Currently, the patient is sleeping.  Physical Exam  BP 95/74   Pulse 84   Temp 97.6 F (36.4 C) (Oral)   Resp 19   Ht 5\' 8"  (1.727 m)   Wt 75.2 kg   SpO2 97%   BMI 25.21 kg/m  Physical Exam General: No acute distress Cardiac: Well-perfused Lungs: Nonlabored Psych: Calm  ED Course / MDM  EKG:EKG Interpretation Date/Time:  Friday February 09 2023 19:54:32 EDT Ventricular Rate:  89 PR Interval:    QRS Duration:  82 QT Interval:  364 QTC Calculation: 442 R Axis:   204  Text Interpretation: Atrial fibrillation Right superior axis deviation Possible Right ventricular hypertrophy Cannot rule out Anteroseptal infarct , age undetermined Abnormal ECG When compared with ECG of 06-Feb-2023 19:44, No significant change was found Confirmed by Dione Booze ([id number]) on 02/10/2023 12:28:50 AM  I have reviewed the labs performed to date as well as medications administered while in observation.  Recent changes in the last 24 hours include social work continuing to work on placement.  Plan  Current plan is for placement.    Terrilee Files, MD 02/17/23 Windy Fast

## 2023-02-17 NOTE — ED Notes (Signed)
Patient combative during ADL's. Solicitor.

## 2023-02-18 DIAGNOSIS — S065X0A Traumatic subdural hemorrhage without loss of consciousness, initial encounter: Secondary | ICD-10-CM | POA: Diagnosis not present

## 2023-02-18 NOTE — ED Notes (Signed)
Attempted to give pt medication. Pt spit the medication at staff "I told you I don't want the shit." Pt cleaned and turned at this time.

## 2023-02-18 NOTE — ED Provider Notes (Signed)
Emergency Medicine Observation Re-evaluation Note  [name] is a 89 y.o. male, seen on rounds today.  Pt initially presented to the ED for complaints of Aggressive Behavior Currently, the patient is sleeping.  Physical Exam  BP 139/63   Pulse 71   Temp 97.6 F (36.4 C) (Oral)   Resp 16   Ht 5\' 8"  (1.727 m)   Wt 75.2 kg   SpO2 97%   BMI 25.21 kg/m  Physical Exam General: No acute distress Cardiac: Well-perfused Lungs: Nonlabored Psych: Calm  ED Course / MDM  EKG:EKG Interpretation Date/Time:  Friday February 09 2023 19:54:32 EDT Ventricular Rate:  89 PR Interval:    QRS Duration:  82 QT Interval:  364 QTC Calculation: 442 R Axis:   204  Text Interpretation: Atrial fibrillation Right superior axis deviation Possible Right ventricular hypertrophy Cannot rule out Anteroseptal infarct , age undetermined Abnormal ECG When compared with ECG of 06-Feb-2023 19:44, No significant change was found Confirmed by Dione Booze ([id number]) on 02/10/2023 12:28:50 AM  I have reviewed the labs performed to date as well as medications administered while in observation.  Recent changes in the last 24 hours include patient had a period of agitation.  Plan  Current plan is for social work working on placement.    Terrilee Files, MD 02/18/23 [phone]

## 2023-02-18 NOTE — ED Notes (Signed)
Offered pt lunch and he refused. Asked multiple times. Will leave for a little while longer to offer again. Patient back asleep.

## 2023-02-18 NOTE — ED Notes (Addendum)
Pt resting comfortably at this time.

## 2023-02-19 DIAGNOSIS — S065X0A Traumatic subdural hemorrhage without loss of consciousness, initial encounter: Secondary | ICD-10-CM | POA: Diagnosis not present

## 2023-02-19 NOTE — ED Notes (Signed)
Pt's meds crushed and mixed with apple sauce. Ate approximately 1/4 of a serving of apple sauce with sips of cola behind each bite. No problems with eating in this manner.

## 2023-02-19 NOTE — ED Notes (Signed)
Pt ate entire cup of applesauce & drank entire cup of thickened Orange juice.

## 2023-02-19 NOTE — ED Provider Notes (Signed)
Emergency Medicine Observation Re-evaluation Note  [name] is a 89 y.o. male, seen on rounds today.  Pt initially presented to the ED for complaints of Aggressive Behavior Currently, the patient is sleeping.  Physical Exam  BP (!) 100/51   Pulse 79   Temp 98.1 F (36.7 C) (Oral)   Resp 15   Ht 5\' 8"  (1.727 m)   Wt 75.2 kg   SpO2 99%   BMI 25.21 kg/m  Physical Exam General: Sleeping Cardiac: Extremities well-perfused Lungs: Unlabored respirations Psych: Deferred  ED Course / MDM  EKG:EKG Interpretation Date/Time:  Friday February 09 2023 19:54:32 EDT Ventricular Rate:  89 PR Interval:    QRS Duration:  82 QT Interval:  364 QTC Calculation: 442 R Axis:   204  Text Interpretation: Atrial fibrillation Right superior axis deviation Possible Right ventricular hypertrophy Cannot rule out Anteroseptal infarct , age undetermined Abnormal ECG When compared with ECG of 06-Feb-2023 19:44, No significant change was found Confirmed by Dione Booze ([id number]) on 02/10/2023 12:28:50 AM  I have reviewed the labs performed to date as well as medications administered while in observation.  Recent changes in the last 24 hours include none.  Plan  Current plan is for placement.    Gloris Manchester, MD 02/19/23 [phone]

## 2023-02-19 NOTE — ED Notes (Signed)
CSW following. Pt has insurance auth for SNF good through 02/21/23.   Spoke with Jill Side at [facility] who states that they likely cannot take pt for 1-2 days as they are waiting on a room to open up in the Memory Care Unit.  Per Jill Side, they had to take in multiple patients from one of their sister facilities due to weather/flooding concerns. Apparently these patients will start transitioning back to their facility later today. Jill Side states it is unlikely a room will be available for him today but she will update CSW if one does become available.  Will follow.

## 2023-02-20 DIAGNOSIS — S065X0A Traumatic subdural hemorrhage without loss of consciousness, initial encounter: Secondary | ICD-10-CM | POA: Diagnosis not present

## 2023-02-20 NOTE — ED Notes (Signed)
Pt eating a few bites of food. Able to take PO meds w/o incident. Refused miralax

## 2023-02-20 NOTE — ED Provider Notes (Signed)
Emergency Medicine Observation Re-evaluation Note  [name] is a 89 y.o. male, seen on rounds today.  Pt initially presented to the ED for complaints of Aggressive Behavior Currently, the patient is sleeping.  Physical Exam  BP 124/76   Pulse 90   Temp 97.6 F (36.4 C) (Axillary)   Resp 17   Ht 5\' 8"  (1.727 m)   Wt 75.2 kg   SpO2 91%   BMI 25.21 kg/m  Physical Exam General: Sleeping Cardiac: Extremities well-perfused Lungs: Breathing is unlabored Psych: Deferred  ED Course / MDM  EKG:EKG Interpretation Date/Time:  Friday February 09 2023 19:54:32 EDT Ventricular Rate:  89 PR Interval:    QRS Duration:  82 QT Interval:  364 QTC Calculation: 442 R Axis:   204  Text Interpretation: Atrial fibrillation Right superior axis deviation Possible Right ventricular hypertrophy Cannot rule out Anteroseptal infarct , age undetermined Abnormal ECG When compared with ECG of 06-Feb-2023 19:44, No significant change was found Confirmed by Dione Booze ([id number]) on 02/10/2023 12:28:50 AM  I have reviewed the labs performed to date as well as medications administered while in observation.  Recent changes in the last 24 hours include none.  Plan  Current plan is for placement.    Gloris Manchester, MD 02/20/23 [phone]

## 2023-02-20 NOTE — ED Notes (Signed)
Pt ate one bite of dinner, meds given w/o incident. Pt brief changed and pt cleaned.

## 2023-02-20 NOTE — ED Notes (Signed)
CSW spoke to [place] with [employer] [facility]. Per [name] 2 individuals in their memory care unit have tested positive for COVID so admissions are on hold. [name] states that admin states pt cannot go to the regular floor as he is ambulatory. At this time [facility] will not be able to take pt for another week. [name] states that pt still has a bed with the facility but will need to admit after memory care residents quarantine. TOC to follow.

## 2023-02-21 DIAGNOSIS — S065X0A Traumatic subdural hemorrhage without loss of consciousness, initial encounter: Secondary | ICD-10-CM | POA: Diagnosis not present

## 2023-02-21 NOTE — ED Notes (Signed)
Pt brief and linen changed. Pt repositioned in bed.

## 2023-02-21 NOTE — ED Provider Notes (Signed)
Emergency Medicine Observation Re-evaluation Note  [name] is a 89 y.o. male, seen on rounds today.  Pt initially presented to the ED for complaints of Aggressive Behavior Currently, the patient is resting. Taking po medications and eating w/o incident  Physical Exam  BP (!) 119/59 (BP Location: Left Arm)   Pulse 91   Temp 97.6 F (36.4 C) (Axillary)   Resp 16   Ht 5\' 8"  (1.727 m)   Wt 75.2 kg   SpO2 95%   BMI 25.21 kg/m  Physical Exam General: NAD Lungs: no resp distress Psych: calm  ED Course / MDM  EKG:EKG Interpretation Date/Time:  Friday February 09 2023 19:54:32 EDT Ventricular Rate:  89 PR Interval:    QRS Duration:  82 QT Interval:  364 QTC Calculation: 442 R Axis:   204  Text Interpretation: Atrial fibrillation Right superior axis deviation Possible Right ventricular hypertrophy Cannot rule out Anteroseptal infarct , age undetermined Abnormal ECG When compared with ECG of 06-Feb-2023 19:44, No significant change was found Confirmed by Dione Booze ([id number]) on 02/10/2023 12:28:50 AM  I have reviewed the labs performed to date as well as medications administered while in observation.  Recent changes in the last 24 hours include none, pending placement.  Plan  Current plan is for placement.    Sloan Leiter, DO 02/21/23 [phone]

## 2023-02-22 DIAGNOSIS — S065X0A Traumatic subdural hemorrhage without loss of consciousness, initial encounter: Secondary | ICD-10-CM | POA: Diagnosis not present

## 2023-02-22 MED ORDER — ACETAMINOPHEN 325 MG PO TABS
650.0000 mg | ORAL_TABLET | Freq: Once | ORAL | Status: DC
  Filled 2023-02-22: qty 2

## 2023-02-22 NOTE — ED Notes (Signed)
Pt woke up to take tylenol, encouraged pt to take as many bites of applesauce as he would and he ate about 5. He took his tylenol without problems, but refused anymore to eat.

## 2023-02-22 NOTE — ED Notes (Signed)
Pt repositioned and brief changed, pt had a bowel movement and urine In his brief.

## 2023-02-22 NOTE — ED Notes (Signed)
Pt had meds with a half container of apple sauce, pt also drank some of his stool softener that is mixed with water, it is at bedside.

## 2023-02-22 NOTE — ED Provider Notes (Signed)
Emergency Medicine Observation Re-evaluation Note  [name] is a 89 y.o. male, seen on rounds today.  Pt initially presented to the ED for complaints of Aggressive Behavior Currently, the patient is asleep.  Physical Exam  BP 112/66 (BP Location: Left Arm)   Pulse 62   Temp 98.8 F (37.1 C) (Axillary)   Resp 12   Ht 5\' 8"  (1.727 m)   Wt 75.2 kg   SpO2 96%   BMI 25.21 kg/m  Physical Exam General: asleep Cardiac: asleep Lungs: asleep Psych: asleep  ED Course / MDM  EKG:EKG Interpretation Date/Time:  Friday February 09 2023 19:54:32 EDT Ventricular Rate:  89 PR Interval:    QRS Duration:  82 QT Interval:  364 QTC Calculation: 442 R Axis:   204  Text Interpretation: Atrial fibrillation Right superior axis deviation Possible Right ventricular hypertrophy Cannot rule out Anteroseptal infarct , age undetermined Abnormal ECG When compared with ECG of 06-Feb-2023 19:44, No significant change was found Confirmed by Dione Booze ([id number]) on 02/10/2023 12:28:50 AM  I have reviewed the labs performed to date as well as medications administered while in observation.  No recent changes in the last 24 hours.  Plan  Current plan is for placement.    Pricilla Loveless, MD 02/22/23 [phone]

## 2023-02-22 NOTE — ED Notes (Addendum)
Pts brief checked no stool or urine, pt repositioned.

## 2023-02-22 NOTE — Therapy (Signed)
Attempted PT treatment, however pt was too confused and combative towards nursing today.  Unable to proceed; pt refused to participate with therapy.  Lurena Nida, PTA/CLT [facility] Ph: [phone]

## 2023-02-22 NOTE — ED Notes (Signed)
Pt yelling and combative towards staff and stating " I don't want anything as he swings towards nurse. Redirection and attempts x 3.

## 2023-02-22 NOTE — ED Notes (Signed)
Pt only ate a few bites of breakfast and a few swallows of drink. Pt took crushed morning meds well with applesauce. Warm compress applied to left shoulder to help ease the discomfort.

## 2023-02-22 NOTE — ED Notes (Signed)
Pt's meds mixed with apple sauce and pt provided small sips of cola after each bite. Took approximately 3/4s of medications.

## 2023-02-22 NOTE — ED Notes (Signed)
Checked on patient, no soiled brief, repositioned for  comfort.

## 2023-02-22 NOTE — ED Notes (Signed)
Pt is complaining of shoulder pain. EDP was notified.

## 2023-02-22 NOTE — ED Notes (Signed)
Pt out of bed to chair to allow linen change and peri-care with sitter.

## 2023-02-22 NOTE — ED Notes (Signed)
Pt is complaining of his shoulder hurting, Heat pack was given and applied to pts shoulder.

## 2023-02-22 NOTE — ED Notes (Signed)
Pt had a BM and had urinated. Pt and bed linen was changed. Pt was aggressive and hit and kicked staffed while being changed.

## 2023-02-23 ENCOUNTER — Emergency Department (HOSPITAL_COMMUNITY): Payer: Medicare HMO

## 2023-02-23 DIAGNOSIS — M19012 Primary osteoarthritis, left shoulder: Secondary | ICD-10-CM | POA: Diagnosis not present

## 2023-02-23 DIAGNOSIS — S065X0A Traumatic subdural hemorrhage without loss of consciousness, initial encounter: Secondary | ICD-10-CM | POA: Diagnosis not present

## 2023-02-23 DIAGNOSIS — M25512 Pain in left shoulder: Secondary | ICD-10-CM | POA: Diagnosis not present

## 2023-02-23 NOTE — ED Notes (Signed)
Pt has been sleeping all day. Staff has attempted to wake pt up and he states "leave me alone". Continuing to attempt to get pt to eat and drink.

## 2023-02-23 NOTE — ED Notes (Signed)
CSW spoke with [name] at [facility], plan continues to be for hopeful transition to facility next week depending on status of COVID in memory care unit. TOC to follow.

## 2023-02-23 NOTE — ED Notes (Signed)
Pt refused breakfast and lunch today, attempted to feed pt multiple times. Pt screamed he didn't want anything, and started swinging at this nurse.

## 2023-02-23 NOTE — ED Notes (Signed)
Pt had a BM brief changed.

## 2023-02-23 NOTE — ED Notes (Signed)
Pt complaining of lt shoulder pain. Heat pack applied. Tylenol given.

## 2023-02-23 NOTE — ED Notes (Signed)
Pt given bed-bath and clean linen/ gown applied

## 2023-02-23 NOTE — ED Notes (Signed)
Pt had another small BM, Brief changed.

## 2023-02-23 NOTE — ED Provider Notes (Signed)
Emergency Medicine Observation Re-evaluation Note  [name] is a 89 y.o. male, seen on rounds today.  Pt initially presented to the ED for complaints of Aggressive Behavior Currently, the patient is still continuing to be aggressive and complaining of left shoulder pain.  Physical Exam  BP 97/75   Pulse 73   Temp 97.6 F (36.4 C) (Oral)   Resp 14   Ht 5\' 8"  (1.727 m)   Wt 75.2 kg   SpO2 96%   BMI 25.21 kg/m  Physical Exam General: Resting comfortably in stretcher Lungs: Normal work of breathing Psych: Calm  ED Course / MDM  EKG:EKG Interpretation Date/Time:  Friday February 09 2023 19:54:32 EDT Ventricular Rate:  89 PR Interval:    QRS Duration:  82 QT Interval:  364 QTC Calculation: 442 R Axis:   204  Text Interpretation: Atrial fibrillation Right superior axis deviation Possible Right ventricular hypertrophy Cannot rule out Anteroseptal infarct , age undetermined Abnormal ECG When compared with ECG of 06-Feb-2023 19:44, No significant change was found Confirmed by Dione Booze ([id number]) on 02/10/2023 12:28:50 AM  I have reviewed the labs performed to date as well as medications administered while in observation.  Recent changes in the last 24 hours include complaining of left shoulder pain had a heating pack applied.  Continues to be aggressive and not cooperative with staff.  Did not participate in PT yesterday.  Plan  Current plan is for placement.  X-ray of the left shoulder placed.   Rondel Baton, MD 02/23/23 [phone]

## 2023-02-23 NOTE — ED Notes (Signed)
Pt sipped on some soda for this RN

## 2023-02-23 NOTE — ED Notes (Signed)
Pt ate 5 bites of applesauce, 5 bites of ice cream, and 2 sips of ensure

## 2023-02-24 DIAGNOSIS — S065X0A Traumatic subdural hemorrhage without loss of consciousness, initial encounter: Secondary | ICD-10-CM | POA: Diagnosis not present

## 2023-02-24 NOTE — ED Notes (Signed)
With RN assistance, Pt able to eat 50% chocolate pudding cup and drink 100% chocolate ensure protein beverage.

## 2023-02-24 NOTE — ED Notes (Signed)
Pt randomly talking in their sleep

## 2023-02-24 NOTE — ED Provider Notes (Signed)
Emergency Medicine Observation Re-evaluation Note  [name] is a 89 y.o. male, seen on rounds today.  Pt initially presented to the ED for complaints of Aggressive Behavior Currently, the patient is awaiting SNF placement. Pt remains calm, nad.   Physical Exam  BP 134/67   Pulse 74   Temp 97.6 F (36.4 C) (Oral)   Resp 16   Ht 1.727 m (5\' 8" )   Wt 75.2 kg   SpO2 94%   BMI 25.21 kg/m  Physical Exam General: resting.  Cardiac: regular rate.  Lungs: breathing comfortably. Psych: calm.  ED Course / MDM  EKG:EKG Interpretation Date/Time:  Friday February 09 2023 19:54:32 EDT Ventricular Rate:  89 PR Interval:    QRS Duration:  82 QT Interval:  364 QTC Calculation: 442 R Axis:   204  Text Interpretation: Atrial fibrillation Right superior axis deviation Possible Right ventricular hypertrophy Cannot rule out Anteroseptal infarct , age undetermined Abnormal ECG When compared with ECG of 06-Feb-2023 19:44, No significant change was found Confirmed by Dione Booze ([id number]) on 02/10/2023 12:28:50 AM  I have reviewed the labs performed to date as well as medications administered while in observation.  Recent changes in the last 24 hours include ED obs, BH assessment, med management.   Plan  Pt appears stable for d/c to SNF.  Planned placement for patient currently indicates new admissions temporarily on hold due to cases of COVID in facility (although not clear why in 2024 a facility would continue to restrict normal care and flow of patients due to presence or absence of covid as is similar to other viral syndromes).      Cathren Laine, MD 02/24/23 [phone]

## 2023-02-24 NOTE — ED Notes (Signed)
Pt asked for something for his head hurting. Pt given tylenol with applesauce and sips of soda Pt appreciative, tucked back in

## 2023-02-24 NOTE — ED Notes (Addendum)
Pt took medication with applesauce, drank half his water that had miralax mixed in it, pt took 2 sips of ensure  Pt took 2 additional bites of applesauce

## 2023-02-24 NOTE — ED Notes (Signed)
Medications delayed due to patients need of therapeutic rest.

## 2023-02-25 DIAGNOSIS — S065X0A Traumatic subdural hemorrhage without loss of consciousness, initial encounter: Secondary | ICD-10-CM | POA: Diagnosis not present

## 2023-02-25 NOTE — ED Provider Notes (Signed)
Emergency Medicine Observation Re-evaluation Note  [name] is a 89 y.o. male, seen on rounds today.  Pt initially presented to the ED for complaints of Aggressive Behavior Currently, the patient is calm/nad.  Physical Exam  BP 96/78 (BP Location: Right Arm)   Pulse 94   Temp (!) 97.5 F (36.4 C) (Axillary)   Resp 16   Ht 5\' 8"  (1.727 m)   Wt 75.2 kg   SpO2 96%   BMI 25.21 kg/m  Physical Exam General: nad Cardiac: regular rate Lungs: no resp distress Psych: calm  ED Course / MDM  EKG:EKG Interpretation Date/Time:  Friday February 09 2023 19:54:32 EDT Ventricular Rate:  89 PR Interval:    QRS Duration:  82 QT Interval:  364 QTC Calculation: 442 R Axis:   204  Text Interpretation: Atrial fibrillation Right superior axis deviation Possible Right ventricular hypertrophy Cannot rule out Anteroseptal infarct , age undetermined Abnormal ECG When compared with ECG of 06-Feb-2023 19:44, No significant change was found Confirmed by Dione Booze ([id number]) on 02/10/2023 12:28:50 AM  I have reviewed the labs performed to date as well as medications administered while in observation.  Recent changes in the last 24 hours include ongoing ED obs.  Plan  Current plan is for awaiting discharge to SNF, delay 2/2 report of covid at facility so they are holding the admission.    Sloan Leiter, DO 02/25/23 [phone]

## 2023-02-26 DIAGNOSIS — S065X0A Traumatic subdural hemorrhage without loss of consciousness, initial encounter: Secondary | ICD-10-CM | POA: Diagnosis not present

## 2023-02-26 LAB — CBC
HCT: 41.8 % (ref 39.0–52.0)
Hemoglobin: 14.4 g/dL (ref 13.0–17.0)
MCH: 32.7 pg (ref 26.0–34.0)
MCHC: 34.4 g/dL (ref 30.0–36.0)
MCV: 94.8 fL (ref 80.0–100.0)
Platelets: 219 10*3/uL (ref 150–400)
RBC: 4.41 MIL/uL (ref 4.22–5.81)
RDW: 12.6 % (ref 11.5–15.5)
WBC: 8.9 10*3/uL (ref 4.0–10.5)
nRBC: 0 % (ref 0.0–0.2)

## 2023-02-26 NOTE — ED Notes (Signed)
Pt took medication without trouble. Pt took 3 bites of breakfast and drank 3 sips of orange juice. Pt would not drink water.

## 2023-02-26 NOTE — ED Notes (Signed)
CSW spoke to [place] who states that pt needs insurance auth. Pt needs to be seen by PT in order for auth to be restarted. CSW reached out to PT on call to see if pt can be seen. Insurance auth to be started after PT note is in. TOC to follow.

## 2023-02-26 NOTE — ED Notes (Signed)
Pt checked - bed pad dry at this time. Pt continues to rest quietly. Even rise and fall of chest noted.

## 2023-02-26 NOTE — ED Notes (Signed)
Pt bathed and linens changes. Pt was intermittently combative, but staff was able to get all tasks completed.

## 2023-02-27 DIAGNOSIS — R5381 Other malaise: Secondary | ICD-10-CM | POA: Diagnosis not present

## 2023-02-27 DIAGNOSIS — Z7401 Bed confinement status: Secondary | ICD-10-CM | POA: Diagnosis not present

## 2023-02-27 DIAGNOSIS — S065X0A Traumatic subdural hemorrhage without loss of consciousness, initial encounter: Secondary | ICD-10-CM | POA: Diagnosis not present

## 2023-02-27 DIAGNOSIS — R531 Weakness: Secondary | ICD-10-CM | POA: Diagnosis not present

## 2023-02-27 NOTE — ED Provider Notes (Signed)
Emergency Medicine Observation Re-evaluation Note  [name] is a 89 y.o. male, seen on rounds today.  Pt initially presented to the ED for complaints of Aggressive Behavior Currently, the patient is awaiting nursing home placement.  Physical Exam  BP 132/79 (BP Location: Left Arm)   Pulse 84   Temp 97.8 F (36.6 C) (Axillary)   Resp 17   Ht 5\' 8"  (1.727 m)   Wt 75.2 kg   SpO2 96%   BMI 25.21 kg/m  Physical Exam Awake and in no acute distress  ED Course / MDM  EKG:EKG Interpretation Date/Time:  Friday February 09 2023 19:54:32 EDT Ventricular Rate:  89 PR Interval:    QRS Duration:  82 QT Interval:  364 QTC Calculation: 442 R Axis:   204  Text Interpretation: Atrial fibrillation Right superior axis deviation Possible Right ventricular hypertrophy Cannot rule out Anteroseptal infarct , age undetermined Abnormal ECG When compared with ECG of 06-Feb-2023 19:44, No significant change was found Confirmed by Dione Booze ([id number]) on 02/10/2023 12:28:50 AM  I have reviewed the labs performed to date as well as medications administered while in observation.  Recent changes in the last 24 hours include none.  Plan  Current plan is for nursing home placement.    Bethann Berkshire, MD 02/27/23 [phone]

## 2023-02-27 NOTE — ED Notes (Signed)
Transport got diverted & will return to transport pt.  Report called to facility. Pt resting comfortably in tx room with bed locked in lowest position, telesitter in place.

## 2023-02-27 NOTE — ED Notes (Signed)
Pt brief changed.  

## 2023-02-27 NOTE — [place] FL2 (Signed)
[place] MEDICAID FL2 LEVEL OF CARE FORM     IDENTIFICATION  Patient Name: [name] Birthdate: March 13, 1930 Sex: male Admission Date (Current Location): 02/06/2023  [place] Number:  [employer] and Address:  [facility],  [address] [place] [address]      Provider Number: [id number]  Attending Physician Name and Address:  Default, Provider, MD  Relative Name and Phone Number:  Armandina Gemma (Legal Guardian)  [phone]    Current Level of Care: Hospital Recommended Level of Care: Skilled Nursing Facility Prior Approval Number:    Date Approved/Denied:   PASRR Number: [id number]  Discharge Plan:      Current Diagnoses: Patient Active Problem List   Diagnosis Date Noted   Dementia with behavioral disturbance (HCC) 02/07/2023   Syncope 11/02/2020   Macrocytic anemia 11/02/2020   Left rib fracture 11/02/2020   Scalp hematoma 11/02/2020   Fall 11/01/2020   Hygroma 09/10/2018   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   Chest pain 12/29/2013   Atrial fibrillation (HCC) 12/29/2013   VERTIGO 02/04/2010   [name] DEFICIENCY 01/31/2010   Memory loss 01/27/2010   PARESTHESIA 01/27/2010   Hyperlipidemia 11/02/2009   CORONARY ARTERY DISEASE 11/02/2009   SINUSITIS, ACUTE 11/02/2009   EPISTAXIS 11/02/2009   COLONIC POLYPS, HX OF 11/02/2009    Orientation RESPIRATION BLADDER Height & Weight     Self, Place  Normal Continent Weight: 165 lb 12.6 oz (75.2 kg) Height:  5\' 8"  (172.7 cm)  BEHAVIORAL SYMPTOMS/MOOD NEUROLOGICAL BOWEL NUTRITION STATUS      Continent Diet (Regular no salt added)  AMBULATORY STATUS COMMUNICATION OF NEEDS Skin   Extensive Assist Verbally Normal                       Personal Care Assistance Level of Assistance  Bathing, Feeding, Dressing Bathing Assistance: Maximum assistance Feeding assistance: Limited assistance Dressing Assistance: Maximum assistance      Functional Limitations Info  Sight, Hearing, Speech Sight Info: Adequate Hearing Info: Adequate Speech Info: Adequate    SPECIAL CARE FACTORS FREQUENCY  PT (By licensed PT)     PT Frequency: 5 Times a week              Contractures Contractures Info: Not present    Additional Factors Info  Code Status, Allergies Code Status Info: Full Allergies Info: NKDA           Current Medications (02/27/2023):  This is the current hospital active medication list Current Facility-Administered Medications  Medication Dose Route Frequency Provider Last Rate Last Admin   acetaminophen (TYLENOL) tablet 650 mg  650 mg Oral Q6H Rondel Baton, MD   650 mg at 02/27/23 0950   acetaminophen (TYLENOL) tablet 650 mg  650 mg Oral Once Bethann Berkshire, MD       atorvastatin (LIPITOR) tablet 20 mg  20 mg Oral q1800 Rondel Baton, MD   20 mg at 02/26/23 1820   cyanocobalamin (VITAMIN [name]) tablet 1,000 mcg  1,000 mcg Oral Daily Rondel Baton, MD   1,000 mcg at 02/27/23 1010   loratadine (CLARITIN) tablet 10 mg  10 mg Oral Daily Rondel Baton, MD   10 mg at 02/27/23 0950   midodrine (PROAMATINE) tablet 5 mg  5 mg Oral BID WC Rondel Baton, MD   5 mg at 02/27/23 0950   polyethylene glycol (MIRALAX / GLYCOLAX) packet 17 g  17 g Oral  BID Kommor, [place], MD   17 g at 02/27/23 0950   QUEtiapine (SEROQUEL) tablet 25 mg  25 mg Oral QHS Lenox Ponds, NP   25 mg at 02/25/23 2102   Rivaroxaban (XARELTO) tablet 15 mg  15 mg Oral Q supper Rondel Baton, MD   15 mg at 02/26/23 1818   traZODone (DESYREL) tablet 25 mg  25 mg Oral BID PRN Lenox Ponds, NP   25 mg at 02/27/23 0109   Current Outpatient Medications  Medication Sig Dispense Refill   acetaminophen (TYLENOL) 325 MG tablet Take 2 tablets (650 mg total) by mouth every 6 (six) hours.     atorvastatin (LIPITOR) 20 MG tablet Take 1 tablet (20 mg total) by mouth daily at 6 PM.     loratadine (CLARITIN) 10 MG tablet Take  10 mg by mouth daily.       Discharge Medications: Please see after visit summary for a list of discharge medications.  Relevant Imaging Results:  Relevant Lab Results:   Additional Information SS# [id number]  [place], [place]

## 2023-02-27 NOTE — ED Provider Notes (Signed)
Emergency Medicine Observation Re-evaluation Note  [name] is a 89 y.o. male, seen on rounds today.  Pt initially presented to the ED for complaints of Aggressive Behavior Currently, the patient is stable, accepted at a facility in [place].  Physical Exam  BP 132/79 (BP Location: Left Arm)   Pulse 84   Temp 97.8 F (36.6 C) (Axillary)   Resp 17   Ht 1.727 m (5\' 8" )   Wt 75.2 kg   SpO2 96%   BMI 25.21 kg/m    ED Course / MDM  EKG:EKG Interpretation Date/Time:  Friday February 09 2023 19:54:32 EDT Ventricular Rate:  89 PR Interval:    QRS Duration:  82 QT Interval:  364 QTC Calculation: 442 R Axis:   204  Text Interpretation: Atrial fibrillation Right superior axis deviation Possible Right ventricular hypertrophy Cannot rule out Anteroseptal infarct , age undetermined Abnormal ECG When compared with ECG of 06-Feb-2023 19:44, No significant change was found Confirmed by Dione Booze ([id number]) on 02/10/2023 12:28:50 AM  I have reviewed the labs performed to date as well as medications administered while in observation.  Recent changes in the last 24 hours include accepted at facility.  Plan  Current plan is for accepted, transfer to [place] facility.    Eber Hong, MD 02/27/23 [phone]

## 2023-02-27 NOTE — Discharge Instructions (Signed)
Please follow-up with your family doctor within the week.  Thankfully your testing has been reassuring, your vital signs are reassuring, the mild agitation was related to the dementia and has been well-controlled in the emergency department.  Please see your family doctor this week for recheck

## 2023-02-27 NOTE — ED Notes (Signed)
CSW spoke to [name] to provide update on PT note. [name] states she will speak with her admin office to see if they are able to accept pt to their building. TOC supervisor also reaching out to additional facilities at this time as well. TOC to follow.

## 2023-02-27 NOTE — ED Notes (Signed)
CSW spoke to Homer C Jones with [facility] who states they can accept pt today. CSW updated Fl2 and MD signed, CSW sent to facility. CSW updated RN of numbers for room and report. CSW updated MD of plan for D/C, pt placed up for D/C and AVS sent to facility. CSW updated pts legal guardian of plan for D/C. Pt will go to facility via EMS, secretary will set up when pt is ready. TOC signing off.

## 2023-02-27 NOTE — Progress Notes (Signed)
PT Discharge Note  Patient Details Name: [name] MRN: [id number] DOB: [birth year]   Discharge Treatment:     Pt continues to refuse treatment, yelling, "I don't want to walk".  Therapist attempted to get pt to complete some exercises and pt rolls so his back is towards the therapist. Pt unable to get pt to even scoot up in the bed to be more comfortable.  PT has continually refused with therapy.  We will discharge at this time.  Virgina Organ, PT CLT [phone]  02/27/2023, 9:04 AM

## 2023-03-01 DIAGNOSIS — R41841 Cognitive communication deficit: Secondary | ICD-10-CM | POA: Diagnosis not present

## 2023-03-01 DIAGNOSIS — R1312 Dysphagia, oropharyngeal phase: Secondary | ICD-10-CM | POA: Diagnosis not present

## 2023-03-01 DIAGNOSIS — R41840 Attention and concentration deficit: Secondary | ICD-10-CM | POA: Diagnosis not present

## 2023-03-01 DIAGNOSIS — Z741 Need for assistance with personal care: Secondary | ICD-10-CM | POA: Diagnosis not present

## 2023-03-01 DIAGNOSIS — M6281 Muscle weakness (generalized): Secondary | ICD-10-CM | POA: Diagnosis not present

## 2023-03-01 DIAGNOSIS — R2681 Unsteadiness on feet: Secondary | ICD-10-CM | POA: Diagnosis not present

## 2023-03-01 DIAGNOSIS — Z9181 History of falling: Secondary | ICD-10-CM | POA: Diagnosis not present

## 2023-03-02 DIAGNOSIS — R1312 Dysphagia, oropharyngeal phase: Secondary | ICD-10-CM | POA: Diagnosis not present

## 2023-03-02 DIAGNOSIS — R41841 Cognitive communication deficit: Secondary | ICD-10-CM | POA: Diagnosis not present

## 2023-03-02 DIAGNOSIS — M6281 Muscle weakness (generalized): Secondary | ICD-10-CM | POA: Diagnosis not present

## 2023-03-02 DIAGNOSIS — Z741 Need for assistance with personal care: Secondary | ICD-10-CM | POA: Diagnosis not present

## 2023-03-02 DIAGNOSIS — R41840 Attention and concentration deficit: Secondary | ICD-10-CM | POA: Diagnosis not present

## 2023-03-02 DIAGNOSIS — R2681 Unsteadiness on feet: Secondary | ICD-10-CM | POA: Diagnosis not present

## 2023-03-02 DIAGNOSIS — Z9181 History of falling: Secondary | ICD-10-CM | POA: Diagnosis not present

## 2023-03-05 DIAGNOSIS — M6281 Muscle weakness (generalized): Secondary | ICD-10-CM | POA: Diagnosis not present

## 2023-03-05 DIAGNOSIS — R2681 Unsteadiness on feet: Secondary | ICD-10-CM | POA: Diagnosis not present

## 2023-03-05 DIAGNOSIS — Z741 Need for assistance with personal care: Secondary | ICD-10-CM | POA: Diagnosis not present

## 2023-03-05 DIAGNOSIS — R41841 Cognitive communication deficit: Secondary | ICD-10-CM | POA: Diagnosis not present

## 2023-03-05 DIAGNOSIS — R1312 Dysphagia, oropharyngeal phase: Secondary | ICD-10-CM | POA: Diagnosis not present

## 2023-03-05 DIAGNOSIS — R41840 Attention and concentration deficit: Secondary | ICD-10-CM | POA: Diagnosis not present

## 2023-03-05 DIAGNOSIS — Z9181 History of falling: Secondary | ICD-10-CM | POA: Diagnosis not present

## 2023-03-06 DIAGNOSIS — R1312 Dysphagia, oropharyngeal phase: Secondary | ICD-10-CM | POA: Diagnosis not present

## 2023-03-06 DIAGNOSIS — R41841 Cognitive communication deficit: Secondary | ICD-10-CM | POA: Diagnosis not present

## 2023-03-06 DIAGNOSIS — R41840 Attention and concentration deficit: Secondary | ICD-10-CM | POA: Diagnosis not present

## 2023-03-06 DIAGNOSIS — M6281 Muscle weakness (generalized): Secondary | ICD-10-CM | POA: Diagnosis not present

## 2023-03-06 DIAGNOSIS — Z741 Need for assistance with personal care: Secondary | ICD-10-CM | POA: Diagnosis not present

## 2023-03-06 DIAGNOSIS — R2681 Unsteadiness on feet: Secondary | ICD-10-CM | POA: Diagnosis not present

## 2023-03-06 DIAGNOSIS — Z9181 History of falling: Secondary | ICD-10-CM | POA: Diagnosis not present

## 2023-03-07 DIAGNOSIS — R1312 Dysphagia, oropharyngeal phase: Secondary | ICD-10-CM | POA: Diagnosis not present

## 2023-03-07 DIAGNOSIS — R41841 Cognitive communication deficit: Secondary | ICD-10-CM | POA: Diagnosis not present

## 2023-03-07 DIAGNOSIS — R2681 Unsteadiness on feet: Secondary | ICD-10-CM | POA: Diagnosis not present

## 2023-03-07 DIAGNOSIS — Z741 Need for assistance with personal care: Secondary | ICD-10-CM | POA: Diagnosis not present

## 2023-03-07 DIAGNOSIS — Z9181 History of falling: Secondary | ICD-10-CM | POA: Diagnosis not present

## 2023-03-07 DIAGNOSIS — R41840 Attention and concentration deficit: Secondary | ICD-10-CM | POA: Diagnosis not present

## 2023-03-07 DIAGNOSIS — M6281 Muscle weakness (generalized): Secondary | ICD-10-CM | POA: Diagnosis not present

## 2023-03-09 DIAGNOSIS — R1312 Dysphagia, oropharyngeal phase: Secondary | ICD-10-CM | POA: Diagnosis not present

## 2023-03-09 DIAGNOSIS — R41841 Cognitive communication deficit: Secondary | ICD-10-CM | POA: Diagnosis not present

## 2023-03-09 DIAGNOSIS — R41840 Attention and concentration deficit: Secondary | ICD-10-CM | POA: Diagnosis not present

## 2023-03-09 DIAGNOSIS — R2681 Unsteadiness on feet: Secondary | ICD-10-CM | POA: Diagnosis not present

## 2023-03-09 DIAGNOSIS — Z9181 History of falling: Secondary | ICD-10-CM | POA: Diagnosis not present

## 2023-03-09 DIAGNOSIS — M6281 Muscle weakness (generalized): Secondary | ICD-10-CM | POA: Diagnosis not present

## 2023-03-09 DIAGNOSIS — Z741 Need for assistance with personal care: Secondary | ICD-10-CM | POA: Diagnosis not present

## 2023-03-12 DIAGNOSIS — M6281 Muscle weakness (generalized): Secondary | ICD-10-CM | POA: Diagnosis not present

## 2023-03-12 DIAGNOSIS — R2681 Unsteadiness on feet: Secondary | ICD-10-CM | POA: Diagnosis not present

## 2023-03-12 DIAGNOSIS — R4182 Altered mental status, unspecified: Secondary | ICD-10-CM | POA: Diagnosis not present

## 2023-03-12 DIAGNOSIS — Z741 Need for assistance with personal care: Secondary | ICD-10-CM | POA: Diagnosis not present

## 2023-03-12 DIAGNOSIS — R5381 Other malaise: Secondary | ICD-10-CM | POA: Diagnosis not present

## 2023-03-12 DIAGNOSIS — Z9181 History of falling: Secondary | ICD-10-CM | POA: Diagnosis not present

## 2023-03-12 DIAGNOSIS — R41840 Attention and concentration deficit: Secondary | ICD-10-CM | POA: Diagnosis not present

## 2023-03-13 DIAGNOSIS — Z9181 History of falling: Secondary | ICD-10-CM | POA: Diagnosis not present

## 2023-03-13 DIAGNOSIS — Z741 Need for assistance with personal care: Secondary | ICD-10-CM | POA: Diagnosis not present

## 2023-03-13 DIAGNOSIS — R41840 Attention and concentration deficit: Secondary | ICD-10-CM | POA: Diagnosis not present

## 2023-03-13 DIAGNOSIS — M6281 Muscle weakness (generalized): Secondary | ICD-10-CM | POA: Diagnosis not present

## 2023-03-13 DIAGNOSIS — R2681 Unsteadiness on feet: Secondary | ICD-10-CM | POA: Diagnosis not present

## 2023-03-14 ENCOUNTER — Ambulatory Visit: Payer: Medicare HMO | Attending: Medical | Admitting: Medical

## 2023-03-14 DIAGNOSIS — Z9181 History of falling: Secondary | ICD-10-CM | POA: Diagnosis not present

## 2023-03-14 DIAGNOSIS — R2681 Unsteadiness on feet: Secondary | ICD-10-CM | POA: Diagnosis not present

## 2023-03-14 DIAGNOSIS — G4700 Insomnia, unspecified: Secondary | ICD-10-CM | POA: Diagnosis not present

## 2023-03-14 DIAGNOSIS — F3189 Other bipolar disorder: Secondary | ICD-10-CM | POA: Diagnosis not present

## 2023-03-14 DIAGNOSIS — Z741 Need for assistance with personal care: Secondary | ICD-10-CM | POA: Diagnosis not present

## 2023-03-14 DIAGNOSIS — R41840 Attention and concentration deficit: Secondary | ICD-10-CM | POA: Diagnosis not present

## 2023-03-14 DIAGNOSIS — M6281 Muscle weakness (generalized): Secondary | ICD-10-CM | POA: Diagnosis not present

## 2023-03-14 NOTE — Progress Notes (Deleted)
Cardiology Office Note:    Date:  03/14/2023   ID:  [name], DOB 25-Apr-1930, MRN [id number]  PCP:  Adrian Spar, MD  [facility] Cardiologist:  Adrian Rich, MD  [facility] Electrophysiologist:  None   Referring MD: [name]*   Chief Complaint: Overdue follow-up  History of Present Illness:    [name] is a 89 y.o. male with a hx of permanent Afib, chronic systolic HF, hypotension, dementia, prior CVA in 09/2018 who presents for overdue follow-up.   The patient had a stroke in 09/2018. Echo at that time showed LVEF 40-45%. No ischemic evaluation was performed due to ischemia and advanced age. Medical therapy is limited due to low Bps. Repeat echo 10/2020 showed LVEF 40%. Patient has required midodrine in the past. Patient has permanent afib on Xarelto.   Patient was last seen in 2022 and was overall stable from a cardiac perspective.   Today,   Past Medical History:  Diagnosis Date   [name] deficiency 2011   Bladder cancer (HCC)    Malignet Neoplasm   CAD (coronary artery disease)    Cerebral infarction (HCC)    Dementia (HCC)    Hx of colonic polyp    Hyperlipidemia    Memory loss 2011   Unspecified atrial fibrillation [facility])     Past Surgical History:  Procedure Laterality Date   CHOLECYSTECTOMY      Current Medications: No outpatient medications have been marked as taking for the 03/14/23 encounter (Appointment) with [name], Adrian Ardis H, PA-C.     Allergies:   Patient has no known allergies.   Social History   Socioeconomic History   Marital status: Married    Spouse name: Not on file   Number of children: Not on file   Years of education: Not on file   Highest education level: Not on file  Occupational History   Not on file  Tobacco Use   Smoking status: Former    Current packs/day: 0.00    Types: Cigarettes    Quit date: 12/29/1973    Years since quitting: 49.2   Smokeless tobacco: Never  Vaping Use    Vaping status: Never Used  Substance and Sexual Activity   Alcohol use: No   Drug use: Never   Sexual activity: Not Currently  Other Topics Concern   Not on file  Social History Narrative   Regular Exercise- No   Social Determinants of Health   Financial Resource Strain: Not on file  Food Insecurity: Not on file  Transportation Needs: Not on file  Physical Activity: Not on file  Stress: Not on file  Social Connections: Not on file     Family History: The patient's ***family history includes Coronary artery disease in an other family member; Dementia in an other family member.  ROS:   Please see the history of present illness.    *** All other systems reviewed and are negative.  EKGs/Labs/Other Studies Reviewed:    The following studies were reviewed today: ***  EKG:  EKG is *** ordered today.  The ekg ordered today demonstrates ***  Recent Labs: 02/05/2023: TSH 6.804 02/14/2023: ALT 10 02/15/2023: BUN 52; Creatinine, Ser 1.20; Potassium 3.6; Sodium 142 02/26/2023: Hemoglobin 14.4; Platelets 219  Recent Lipid Panel    Component Value Date/Time   CHOL 171 09/12/2018 0354   TRIG 89 09/12/2018 0354   HDL 35 (L) 09/12/2018 0354   CHOLHDL 4.9 09/12/2018 0354   VLDL 18 09/12/2018 0354  LDLCALC 118 (Wright) 09/12/2018 0354     Risk Assessment/Calculations:   {Does this patient have ATRIAL FIBRILLATION?:[phone]}   Physical Exam:    VS:  There were no vitals taken for this visit.    Wt Readings from Last 3 Encounters:  02/06/23 165 lb 12.6 oz (75.2 kg)  02/05/23 165 lb 11.2 oz (75.2 kg)  02/04/23 160 lb (72.6 kg)     GEN: *** Well nourished, well developed in no acute distress HEENT: Normal NECK: No JVD; No carotid bruits LYMPHATICS: No lymphadenopathy CARDIAC: ***RRR, no murmurs, rubs, gallops RESPIRATORY:  Clear to auscultation without rales, wheezing or rhonchi  ABDOMEN: Soft, non-tender, non-distended MUSCULOSKELETAL:  No edema; No deformity  SKIN: Warm  and dry NEUROLOGIC:  Alert and oriented x 3 PSYCHIATRIC:  Normal affect   ASSESSMENT:    No diagnosis found. PLAN:    In order of problems listed above:  ***  Disposition: Follow up {follow up:15908} with ***   Shared Decision Making/Informed Consent   {Are you ordering a CV Procedure (e.g. stress test, cath, DCCV, TEE, etc)?   Press F2        :[id number]}    Signed, Alana Dayton David Stall, PA-C  03/14/2023 11:00 AM    [facility]

## 2023-03-15 DIAGNOSIS — R2681 Unsteadiness on feet: Secondary | ICD-10-CM | POA: Diagnosis not present

## 2023-03-15 DIAGNOSIS — Z741 Need for assistance with personal care: Secondary | ICD-10-CM | POA: Diagnosis not present

## 2023-03-15 DIAGNOSIS — R41840 Attention and concentration deficit: Secondary | ICD-10-CM | POA: Diagnosis not present

## 2023-03-15 DIAGNOSIS — M6281 Muscle weakness (generalized): Secondary | ICD-10-CM | POA: Diagnosis not present

## 2023-03-15 DIAGNOSIS — Z9181 History of falling: Secondary | ICD-10-CM | POA: Diagnosis not present

## 2023-03-16 DIAGNOSIS — I469 Cardiac arrest, cause unspecified: Secondary | ICD-10-CM | POA: Diagnosis not present

## 2023-03-30 NOTE — ED Provider Notes (Signed)
[place] EMERGENCY DEPARTMENT AT [facility] Provider Note   CSN: [id number] Arrival date & time: 02/06/23  1522     History  Chief Complaint  Patient presents with   Aggressive Behavior    [name] is a 89 y.o. male.  89 year old male with a history of dementia and atrial fibrillation on Xarelto who was set up for discharge back to his facility ([facility]).  The patient arrived he was reportedly agitated and they feel that they cannot care for him today brought him back to the emergency department.  No additional falls.     Was seen here after a fall and did have a small subdural hematoma that appeared to have resolved on repeat imaging.  Altered mental status workup at that time was unremarkable.        Home Medications Prior to Admission medications   Medication Sig Start Date End Date Taking? Authorizing Provider  acetaminophen (TYLENOL) 325 MG tablet Take 2 tablets (650 mg total) by mouth every 6 (six) hours. 11/03/20  Yes Johnson, Clanford L, MD  atorvastatin (LIPITOR) 20 MG tablet Take 1 tablet (20 mg total) by mouth daily at 6 PM. 09/14/18  Yes Rinehuls, Kinnie Scales, PA-C  loratadine (CLARITIN) 10 MG tablet Take 10 mg by mouth daily.   Yes [provider]      Allergies    Patient has no known allergies.    Review of Systems   Review of Systems  Physical Exam Updated Vital Signs BP 110/79 (BP Location: Right Arm)   Pulse 88   Temp 98.1 F (36.7 C) (Oral)   Resp 18   Ht 5\' 8"  (1.727 m)   Wt 75.2 kg   SpO2 97%   BMI 25.21 kg/m  Physical Exam Vitals and nursing note reviewed.  Constitutional:      General: He is not in acute distress.    Appearance: He is well-developed.  HENT:     Head: Normocephalic.     Comments: Bruising to right side of face that was seen on previous visit    Right Ear: External ear normal.     Left Ear: External ear normal.     Nose: Nose normal.  Eyes:     Extraocular Movements:  Extraocular movements intact.     Conjunctiva/sclera: Conjunctivae normal.     Pupils: Pupils are equal, round, and reactive to light.  Pulmonary:     Effort: Pulmonary effort is normal. No respiratory distress.  Musculoskeletal:     Cervical back: Normal range of motion and neck supple.  Skin:    General: Skin is warm and dry.  Neurological:     General: No focal deficit present.     Mental Status: He is alert. Mental status is at baseline.     Comments: Confused but moving all 4 extremities.  Conversant.  Appears to be at his baseline.  Psychiatric:        Mood and Affect: Mood normal.        Behavior: Behavior normal.    ED Results / Procedures / Treatments   Labs (all labs ordered are listed, but only abnormal results are displayed) Labs Reviewed  URINALYSIS, ROUTINE W REFLEX MICROSCOPIC - Abnormal; Notable for the following components:      Result Value   Hgb urine dipstick SMALL (*)    Ketones, ur 5 (*)    All other components within normal limits  COMPREHENSIVE METABOLIC PANEL - Abnormal; Notable for  the following components:   CO2 20 (*)    Glucose, Bld 150 (*)    BUN 37 (*)    Total Bilirubin 2.5 (*)    Anion gap 17 (*)    All other components within normal limits  CBC - Abnormal; Notable for the following components:   WBC 20.0 (*)    All other components within normal limits  URINALYSIS, ROUTINE W REFLEX MICROSCOPIC - Abnormal; Notable for the following components:   Color, Urine AMBER (*)    APPearance HAZY (*)    Bilirubin Urine SMALL (*)    Ketones, ur 20 (*)    Protein, ur 30 (*)    All other components within normal limits  BASIC METABOLIC PANEL - Abnormal; Notable for the following components:   Glucose, Bld 130 (*)    BUN 52 (*)    GFR, Estimated 57 (*)    All other components within normal limits  CBC WITH DIFFERENTIAL/PLATELET - Abnormal; Notable for the following components:   WBC 12.4 (*)    RBC 4.19 (*)    Neutro Abs 10.1 (*)    Monocytes  Absolute 1.3 (*)    All other components within normal limits  CBG MONITORING, ED - Abnormal; Notable for the following components:   Glucose-Capillary 112 (*)    All other components within normal limits  RESP PANEL BY RT-PCR (RSV, FLU A&B, COVID)  RVPGX2  LIPASE, BLOOD  CBC    EKG EKG Interpretation Date/Time:  Friday February 09 2023 19:54:32 EDT Ventricular Rate:  89 PR Interval:    QRS Duration:  82 QT Interval:  364 QTC Calculation: 442 R Axis:   204  Text Interpretation: Atrial fibrillation Right superior axis deviation Possible Right ventricular hypertrophy Cannot rule out Anteroseptal infarct , age undetermined Abnormal ECG When compared with ECG of 06-Feb-2023 19:44, No significant change was found Confirmed by Dione Booze ([id number]) on 02/10/2023 12:28:50 AM  Radiology No results found.  Procedures Procedures    Medications Ordered in ED Medications  haloperidol lactate (HALDOL) injection 5 mg (5 mg Intramuscular Given 02/06/23 1828)  LORazepam (ATIVAN) injection 2 mg (2 mg Intramuscular Given 02/07/23 0017)  ziprasidone (GEODON) injection 10 mg (10 mg Intramuscular Given 02/09/23 1855)  ziprasidone (GEODON) injection 10 mg (10 mg Intramuscular Given 02/10/23 1326)  sterile water (preservative free) injection (10 mLs  Given 02/10/23 1327)  ziprasidone (GEODON) injection 10 mg (10 mg Intramuscular Given 02/11/23 1538)  sterile water (preservative free) injection (1 mL Intramuscular Given 02/11/23 1637)  iohexol (OMNIPAQUE) 300 MG/ML solution 80 mL (80 mLs Intravenous Contrast Given 02/14/23 1143)    ED Course/ Medical Decision Making/ A&P                                 Medical Decision Making Amount and/or Complexity of Data Reviewed Radiology: ordered.  Risk Prescription drug management.   [name] is a 89 y.o. male with comorbidities that complicate the patient evaluation including dementia and atrial fibrillation on Xarelto who was sent back from his memory  care facility due to aggressive behavior and agitation  Initial Ddx:  Dementia, delirium, UTI, TBI  MDM/Course:  Patient was recently in the emergency department for altered mental status workup that was unremarkable aside from a subdural hematoma.  Neurosurgery was consulted and they are recommending holding his Xarelto for a week so we will resume this on 02/12/2023.  No additional falls  that would warrant repeat imaging at this time.  Given his recent workup we will hold off on additional labs and will place him in [facility] boarder status and have social work continue to look for placement since his facility will not take him at this time.   This patient presents to the ED for concern of complaints listed in HPI, this involves an extensive number of treatment options, and is a complaint that carries with it a high risk of complications and morbidity. Disposition including potential need for admission considered.   Dispo: TOC border  Additional history obtained from EMS Records reviewed ED Visit Notes I have reviewed the patients home medications and made adjustments as needed Consults: TOC Social Determinants of health:  Elderly         Final Clinical Impression(s) / ED Diagnoses Final diagnoses:  Dementia, unspecified dementia severity, unspecified dementia type, unspecified whether behavioral, psychotic, or mood disturbance or anxiety (HCC)  Agitation due to dementia [facility])    Rx / DC Orders ED Discharge Orders     None         Rondel Baton, MD 03/30/23 1458

## 2023-04-10 DEATH — deceased
